# Patient Record
Sex: Male | Born: 1960 | Race: White | Hispanic: No | Marital: Married | Smoking: Current some day smoker
Health system: Southern US, Community
[De-identification: ages and names within clinical notes are randomized; demographics above are authoritative.]

## PROBLEM LIST (undated history)

## (undated) DIAGNOSIS — I447 Left bundle-branch block, unspecified: Secondary | ICD-10-CM

## (undated) DIAGNOSIS — Z86010 Personal history of colonic polyps: Secondary | ICD-10-CM

## (undated) DIAGNOSIS — Z8739 Personal history of other diseases of the musculoskeletal system and connective tissue: Secondary | ICD-10-CM

## (undated) DIAGNOSIS — J302 Other seasonal allergic rhinitis: Secondary | ICD-10-CM

## (undated) DIAGNOSIS — R3129 Other microscopic hematuria: Secondary | ICD-10-CM

## (undated) DIAGNOSIS — I10 Essential (primary) hypertension: Secondary | ICD-10-CM

## (undated) DIAGNOSIS — I493 Ventricular premature depolarization: Secondary | ICD-10-CM

## (undated) DIAGNOSIS — Z860101 Personal history of adenomatous and serrated colon polyps: Secondary | ICD-10-CM

## (undated) DIAGNOSIS — I7300 Raynaud's syndrome without gangrene: Secondary | ICD-10-CM

## (undated) DIAGNOSIS — Z973 Presence of spectacles and contact lenses: Secondary | ICD-10-CM

## (undated) DIAGNOSIS — E785 Hyperlipidemia, unspecified: Secondary | ICD-10-CM

## (undated) DIAGNOSIS — I429 Cardiomyopathy, unspecified: Secondary | ICD-10-CM

## (undated) DIAGNOSIS — I4729 Other ventricular tachycardia: Secondary | ICD-10-CM

## (undated) DIAGNOSIS — E291 Testicular hypofunction: Secondary | ICD-10-CM

## (undated) DIAGNOSIS — R7303 Prediabetes: Secondary | ICD-10-CM

## (undated) DIAGNOSIS — G709 Myoneural disorder, unspecified: Secondary | ICD-10-CM

## (undated) DIAGNOSIS — M1990 Unspecified osteoarthritis, unspecified site: Secondary | ICD-10-CM

## (undated) DIAGNOSIS — J439 Emphysema, unspecified: Secondary | ICD-10-CM

## (undated) DIAGNOSIS — N200 Calculus of kidney: Secondary | ICD-10-CM

## (undated) DIAGNOSIS — M10371 Gout due to renal impairment, right ankle and foot: Secondary | ICD-10-CM

## (undated) DIAGNOSIS — N529 Male erectile dysfunction, unspecified: Secondary | ICD-10-CM

## (undated) DIAGNOSIS — M1A09X0 Idiopathic chronic gout, multiple sites, without tophus (tophi): Secondary | ICD-10-CM

## (undated) HISTORY — DX: Gout due to renal impairment, right ankle and foot: M10.371

## (undated) HISTORY — DX: Other seasonal allergic rhinitis: J30.2

## (undated) HISTORY — DX: Myoneural disorder, unspecified: G70.9

## (undated) HISTORY — DX: Essential (primary) hypertension: I10

## (undated) HISTORY — PX: WISDOM TOOTH EXTRACTION: SHX21

## (undated) HISTORY — DX: Hyperlipidemia, unspecified: E78.5

---

## 2001-07-06 HISTORY — PX: BICEPS TENDON REPAIR: SHX566

## 2007-07-05 ENCOUNTER — Ambulatory Visit: Payer: Self-pay | Admitting: Family Medicine

## 2007-07-12 ENCOUNTER — Ambulatory Visit: Payer: Self-pay | Admitting: Family Medicine

## 2007-09-09 ENCOUNTER — Ambulatory Visit: Payer: Self-pay | Admitting: Family Medicine

## 2010-05-26 ENCOUNTER — Encounter: Payer: Self-pay | Admitting: Internal Medicine

## 2010-05-26 ENCOUNTER — Ambulatory Visit: Payer: Self-pay | Admitting: Internal Medicine

## 2010-05-26 DIAGNOSIS — E785 Hyperlipidemia, unspecified: Secondary | ICD-10-CM | POA: Insufficient documentation

## 2010-05-26 DIAGNOSIS — M109 Gout, unspecified: Secondary | ICD-10-CM | POA: Insufficient documentation

## 2010-05-26 DIAGNOSIS — I10 Essential (primary) hypertension: Secondary | ICD-10-CM | POA: Insufficient documentation

## 2010-05-27 ENCOUNTER — Encounter: Payer: Self-pay | Admitting: Internal Medicine

## 2010-05-27 LAB — CONVERTED CEMR LAB
AST: 65 units/L — ABNORMAL HIGH (ref 0–37)
Albumin: 4 g/dL (ref 3.5–5.2)
Basophils Absolute: 0.1 10*3/uL (ref 0.0–0.1)
Basophils Relative: 0.8 % (ref 0.0–3.0)
CO2: 28 meq/L (ref 19–32)
Eosinophils Absolute: 0.5 10*3/uL (ref 0.0–0.7)
Glucose, Bld: 123 mg/dL — ABNORMAL HIGH (ref 70–99)
HCT: 46.5 % (ref 39.0–52.0)
Hemoglobin: 16 g/dL (ref 13.0–17.0)
Lymphs Abs: 1.8 10*3/uL (ref 0.7–4.0)
MCHC: 34.3 g/dL (ref 30.0–36.0)
Monocytes Relative: 6.9 % (ref 3.0–12.0)
Neutro Abs: 6.1 10*3/uL (ref 1.4–7.7)
Potassium: 4.3 meq/L (ref 3.5–5.1)
RDW: 13.3 % (ref 11.5–14.6)
Sodium: 139 meq/L (ref 135–145)
TSH: 1.45 microintl units/mL (ref 0.35–5.50)
Total Protein: 6.5 g/dL (ref 6.0–8.3)

## 2010-08-02 ENCOUNTER — Encounter: Payer: Self-pay | Admitting: Internal Medicine

## 2010-08-05 NOTE — Assessment & Plan Note (Signed)
Summary: new pt/pt will come in fasting requesting cpx/njr ok per doc/njr   Vital Signs:  Patient profile:   50 year old male Height:      70 inches Weight:      193 pounds BMI:     27.79 Temp:     97.6 degrees F oral Pulse rate:   80 / minute Pulse rhythm:   regular BP sitting:   164 / 110  (left arm) Cuff size:   large  Vitals Entered By: Alfred Levins, CMA (May 26, 2010 9:01 AM)  Serial Vital Signs/Assessments:  Time      Position  BP       Pulse  Resp  Temp     By                     160/100                        Birdie Sons MD  CC: NPX, fasting   CC:  NPX and fasting.  History of Present Illness: CPX pt states hx of hyperlipidemia---  Preventive Screening-Counseling & Management  Alcohol-Tobacco     Smoking Status: current  Caffeine-Diet-Exercise     Does Patient Exercise: yes      Drug Use:  no.    Current Problems (verified): 1)  Hyperlipidemia  (ICD-272.4) 2)  Hypertension  (ICD-401.9)  Current Medications (verified): 1)  Allopurinol 100 Mg Tabs (Allopurinol) .Marland Kitchen.. 1 By Mouth Daily  Allergies (verified): No Known Drug Allergies  Past History:  Family History: Last updated: 05/26/2010 Family History of CAD Male 1st degree relative <50 Family History Diabetes 1st degree relative Family History Hypertension  Social History: Last updated: 05/26/2010 Married Current Smoker 1 pack every 3 days Alcohol use-yes 1 drink per day Drug use-no Regular exercise-yes 3 hrs weekly works for large [employer])  Risk Factors: Exercise: yes (05/26/2010)  Risk Factors: Smoking Status: current (05/26/2010)  Past Medical History: Hypertension Hyperlipidemia Gout  Past Surgical History: Repair ruture bicep 2003  Family History: Family History of CAD Male 1st degree relative <50 Family History Diabetes 1st degree relative Family History Hypertension  Social History: Married Current Smoker 1 pack every 3 days Alcohol  use-yes 1 drink per day Drug use-no Regular exercise-yes 3 hrs weekly 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 HEENT exam atraumatic, normocephalic symmetric her muscles are intact. Neck is supple without lymphadenopathy or thyromegaly. Chest clear to auscultation cardiac exam S1-S2 are regular. Abdominal exam moderately overweight, to bowel sounds and soft, nontender. Extremities there is no clubbing cyanosis or edema. Neurologic exam is alert and oriented without any motor or sensory deficits. It's worth noting that anterior deltoid area on the right is absent.   Impression & Recommendations:  Problem # 1:  PREVENTIVE HEALTH CARE (ICD-V70.0)  helath maint UTD tetanus immunization given today. I will on discussion with patient regarding lifestyle. No his blood pressure is elevated. I suspect his lipids will also be elevated. He needs a surf on a low calorie diet. He needs to start exercising daily. He needs to consider weight loss. I'll followup with him based on what his laboratory work shows today. We'll contact him after lab work is done.  Orders: UA Dipstick w/o Micro (automated)  (81003) Venipuncture (14782) TLB-Lipid  Panel (80061-LIPID) TLB-BMP (Basic Metabolic Panel-BMET) (80048-METABOL) TLB-CBC Platelet - w/Differential (85025-CBCD) TLB-Hepatic/Liver Function Pnl (80076-HEPATIC) TLB-TSH (Thyroid Stimulating Hormone) (84443-TSH)  Problem # 2:  HYPERLIPIDEMIA (ICD-272.4)  Problem # 3:  HYPERTENSION (ICD-401.9)  Complete Medication List: 1)  Allopurinol 100 Mg Tabs (Allopurinol) .Marland Kitchen.. 1 by mouth daily    Orders Added: 1)  New Patient 40-64 years [99386] 2)  UA Dipstick w/o Micro (automated)  [81003] 3)  Venipuncture [36415] 4)  TLB-Lipid Panel [80061-LIPID] 5)  TLB-BMP (Basic Metabolic Panel-BMET)  [80048-METABOL] 6)  TLB-CBC Platelet - w/Differential [85025-CBCD] 7)  TLB-Hepatic/Liver Function Pnl [80076-HEPATIC] 8)  TLB-TSH (Thyroid Stimulating Hormone) [40347-QQV]    Prevention & Chronic Care Immunizations   Influenza vaccine: Not documented    Tetanus booster: Not documented    Pneumococcal vaccine: Not documented  Other Screening   Smoking status: current  (05/26/2010)  Lipids   Total Cholesterol: Not documented   LDL: Not documented   LDL Direct: Not documented   HDL: Not documented   Triglycerides: Not documented    SGOT (AST): Not documented   SGPT (ALT): Not documented   Alkaline phosphatase: Not documented   Total bilirubin: Not documented  Hypertension   Last Blood Pressure: 164 / 110  (05/26/2010)   Serum creatinine: Not documented   Serum potassium Not documented  Self-Management Support :    Hypertension self-management support: Not documented    Lipid self-management support: Not documented   Physical Exam General Appearance: well developed, well nourished, no acute distress Eyes: conjunctiva and lids normal, PERRL, EOMI,  Ears, Nose, Mouth, Throat: TM clear, nares clear, oral exam WNL Neck: supple, no lymphadenopathy, no thyromegaly, no JVD Respiratory: clear to auscultation and percussion, respiratory effort normal Cardiovascular: regular rate and rhythm, S1-S2, no murmur, rub or gallop, no bruits, peripheral pulses normal and symmetric, no cyanosis, clubbing, edema or varicosities Chest: no scars, masses, tenderness; no asymmetry, skin changes, nipple discharge, no gynecomastia   Gastrointestinal: soft, non-tender; no hepatosplenomegaly, masses; active bowel sounds all quadrants, ; no masses, tenderness, hemorrhoids  Genitourinary: no hernia,  or prostate enlargement Lymphatic: no cervical, axillary or inguinal adenopathy Musculoskeletal: gait normal, muscle tone and strength WNL, no joint swelling, effusions, discoloration, crepitus    Skin: clear, good turgor, color WNL, no rashes, lesions, or ulcerations Neurologic: normal mental status, normal reflexes, normal strength, sensation, and motion Psychiatric: alert; oriented to person, place and time Other Exam:   Appended Document: Orders Update    Clinical Lists Changes  Orders: Added new Service order of Specimen Handling (95638) - Signed Added new Service order of Admin 1st Vaccine (75643) - Signed Added new Service order of Flu Vaccine 52yrs + (32951) - Signed Added new Service order of Tdap => 71yrs IM (88416) - Signed Added new Service order of Admin of Any Addtl Vaccine (60630) - Signed Observations: Added new observation of COMMENTS: Wynona Canes, CMA  May 26, 2010 1:34 PM  (05/26/2010 10:19) Added new observation of PH URINE: 5.0  (05/26/2010 10:19) Added new observation of SPEC GR URIN: 1.025  (05/26/2010 10:19) Added new observation of APPEARANCE U: Clear  (05/26/2010 10:19) Added new observation of UA COLOR: yellow  (05/26/2010 10:19) Added new observation of WBC DIPSTK U: negative  (05/26/2010 10:19) Added new observation of NITRITE URN: negative  (05/26/2010 10:19) Added new observation of UROBILINOGEN: 0.2  (05/26/2010 10:19) Added new observation of PROTEIN, URN: negative  (05/26/2010 10:19) Added new observation of BLOOD UR DIP: trace-lysed  (05/26/2010 10:19) Added new observation of  KETONES URN: negative  (05/26/2010 10:19) Added new observation of BILIRUBIN UR: negative  (05/26/2010 10:19) Added new observation of GLUCOSE, URN: negative  (05/26/2010 10:19) Added new observation of TD BOOSTERLO: [id number]  (05/26/2010 10:19) Added new observation of TD BOOST EXP: 04/24/2012  (05/26/2010 10:19) Added new observation of TD BOOSTERBY: Alfred Levins, CMA  (05/26/2010 10:19) Added new observation of TD BOOSTERRT: IM  (05/26/2010 10:19) Added new observation of TDBOOSTERDSE: 0.5 ml  (05/26/2010 10:19) Added new observation of TD BOOSTERMF:  Glax[employer]Smith[employer]line  (05/26/2010 10:19) Added new observation of TD BOOST SIT: left deltoid  (05/26/2010 10:19) Added new observation of TD BOOSTER: Tdap  (05/26/2010 10:19) Added new observation of FLU VAX VIS: 01/28/10 version  (05/26/2010 10:19) Added new observation of FLU VAXLOT: [id number]  (05/26/2010 10:19) Added new observation of FLU VAXMFR: Glaxosmithkline  (05/26/2010 10:19) Added new observation of FLU VAX EXP: 01/03/2011  (05/26/2010 10:19) Added new observation of FLU VAX DSE: 0.60ml  (05/26/2010 10:19) Added new observation of FLU VAX: Fluvax 3+  (05/26/2010 10:19)Flu Vaccine Consent Questions     Do you have a history of severe allergic reactions to this vaccine? no    Any prior history of allergic reactions to egg and/or gelatin? no    Do you have a sensitivity to the preservative Thimersol? no    Do you have a past history of Guillan-Barre Syndrome? no    Do you currently have an acute febrile illness? no    Have you ever had a severe reaction to latex? no    Vaccine information given and explained to patient? yes    Are you currently pregnant? no    Lot Number:[id number]   Exp Date:01/03/2011   Site Given  right Deltoid IMtion of FLU VAX EXP: 01/03/2011 (05/26/2010 10:19) Added new observation of FLU VAX DSE: 0.44ml (05/26/2010 10:19) Added new observation of FLU VAX: Fluvax 3+ (05/26/2010 10:19)     .[email or web address]    Immunizations Administered:  Tetanus Vaccine:    Vaccine Type: Tdap    Site: left deltoid    Mfr: GlaxoSmithKline    Dose: 0.5 ml    Route: IM    Given by: Alfred Levins, CMA    Exp. Date: 04/24/2012    Lot #: [id number]   Laboratory Results   Urine Tests  Date/Time Recieved: May 26, 2010 1:34 PM  Date/Time Reported: May 26, 2010 1:34 PM   Routine Urinalysis   Color: yellow Appearance: Clear Glucose: negative   (Normal Range: Negative) Bilirubin: negative   (Normal Range: Negative) Ketone: negative   (Normal Range: Negative) Spec.  Gravity: 1.025   (Normal Range: 1.003-1.035) Blood: trace-lysed   (Normal Range: Negative) pH: 5.0   (Normal Range: 5.0-8.0) Protein: negative   (Normal Range: Negative) Urobilinogen: 0.2   (Normal Range: 0-1) Nitrite: negative   (Normal Range: Negative) Leukocyte Esterace: negative   (Normal Range: Negative)    Comments: Wynona Canes, CMA  May 26, 2010 1:34 PM

## 2010-08-05 NOTE — Letter (Signed)
Summary: Metabolic Syndrome Test Result/[employer]  Metabolic Syndrome Test Result/[employer]   Imported By: Maryln Gottron 06/05/2010 10:52:15  _____________________________________________________________________  External Attachment:    Type:   Image     Comment:   External Document

## 2010-08-26 ENCOUNTER — Other Ambulatory Visit (INDEPENDENT_AMBULATORY_CARE_PROVIDER_SITE_OTHER): Payer: Medicare HMO | Admitting: Internal Medicine

## 2010-08-26 DIAGNOSIS — T887XXA Unspecified adverse effect of drug or medicament, initial encounter: Secondary | ICD-10-CM

## 2010-08-26 DIAGNOSIS — E785 Hyperlipidemia, unspecified: Secondary | ICD-10-CM

## 2010-08-26 LAB — LIPID PANEL
Cholesterol: 245 mg/dL — ABNORMAL HIGH (ref 0–200)
HDL: 44.2 mg/dL (ref >39.00)
Triglycerides: 816 mg/dL — ABNORMAL HIGH (ref 0.0–149.0)

## 2010-08-26 LAB — HEPATIC FUNCTION PANEL
Bilirubin, Direct: 0.2 mg/dL (ref 0.0–0.3)
Total Protein: 6.4 g/dL (ref 6.0–8.3)

## 2010-08-27 LAB — LDL CHOLESTEROL, DIRECT: Direct LDL: 100.1 mg/dL

## 2010-09-03 ENCOUNTER — Ambulatory Visit (INDEPENDENT_AMBULATORY_CARE_PROVIDER_SITE_OTHER): Payer: Medicare HMO | Admitting: Internal Medicine

## 2010-09-03 ENCOUNTER — Encounter: Payer: Self-pay | Admitting: Internal Medicine

## 2010-09-03 DIAGNOSIS — M109 Gout, unspecified: Secondary | ICD-10-CM

## 2010-09-03 DIAGNOSIS — E785 Hyperlipidemia, unspecified: Secondary | ICD-10-CM

## 2010-09-03 DIAGNOSIS — I10 Essential (primary) hypertension: Secondary | ICD-10-CM

## 2010-09-03 MED ORDER — FLUTICASONE PROPIONATE 50 MCG/ACT NA SUSP: NASAL | Status: DC

## 2010-09-03 MED ORDER — LISINOPRIL 20 MG PO TABS: 20.0000 mg | ORAL_TABLET | Freq: Every day | ORAL | Status: DC

## 2010-09-03 NOTE — Progress Notes (Signed)
  Subjective:    Patient here for follow-up of elevated blood pressure.  He is exercising and is adherent to a low-salt diet.  Blood pressure is not well controlled at home. Cardiac symptoms: none. Patient denies: chest pain, chest pressure/discomfort, dyspnea and exertional chest pressure/discomfort. Cardiovascular risk factors: dyslipidemia and hypertension. Use of agents associated with hypertension: none. History of target organ damage: none.  The following portions of the patient's history were reviewed and updated as appropriate: allergies, current medications, past family history, past medical history, past social history, past surgical history and problem list.  Review of Systems  patient denies chest pain, shortness of breath, orthopnea. Denies lower extremity edema, abdominal pain, change in appetite, change in bowel movements. Patient denies rashes, musculoskeletal complaints. No other specific complaints in a complete review of systems.     Objective:    well-developed well-nourished male in no acute distress. HEENT exam atraumatic, normocephalic, neck supple without jugular venous distention. Chest clear to auscultation cardiac exam S1-S2 are regular. Abdominal exam overweight with bowel sounds, soft and nontender. Extremities no edema. Neurologic exam is alert with a normal gait.   Assessment:    Hypertension, stage 2 needs treatment. Evidence of target organ damage: none.    Plan:    Medication: start lisinopril.  sidde effects discussed  Note lipids---refuses Rx for now---reconsider in 6 weeks

## 2010-10-13 ENCOUNTER — Ambulatory Visit (INDEPENDENT_AMBULATORY_CARE_PROVIDER_SITE_OTHER): Payer: Medicare HMO | Admitting: Internal Medicine

## 2010-10-13 ENCOUNTER — Encounter: Payer: Self-pay | Admitting: Internal Medicine

## 2010-10-13 VITALS — BP 122/84 | HR 80 | Temp 98.2°F | Ht 69.0 in | Wt 188.0 lb

## 2010-10-13 DIAGNOSIS — J302 Other seasonal allergic rhinitis: Secondary | ICD-10-CM

## 2010-10-13 DIAGNOSIS — I10 Essential (primary) hypertension: Secondary | ICD-10-CM

## 2010-10-13 DIAGNOSIS — E7889 Other lipoprotein metabolism disorders: Secondary | ICD-10-CM

## 2010-10-13 DIAGNOSIS — E785 Hyperlipidemia, unspecified: Secondary | ICD-10-CM

## 2010-10-13 MED ORDER — FLUTICASONE PROPIONATE 50 MCG/ACT NA SUSP: NASAL | Status: DC

## 2010-10-13 MED ORDER — LISINOPRIL 20 MG PO TABS: 20.0000 mg | ORAL_TABLET | Freq: Every day | ORAL | Status: DC

## 2010-10-13 NOTE — Assessment & Plan Note (Signed)
He has lost approx 10 pounds Will recheck labs in 6 weeks and call him with results

## 2010-10-13 NOTE — Assessment & Plan Note (Signed)
Controlled Continue current meds 

## 2010-10-13 NOTE — Progress Notes (Signed)
  Subjective:    Patient ID: [name] [name], male    DOB: [birth year], 50 y.o.   MRN: [id number]  HPI htn---home bps 120-s/70s No side effects on meds  Lipids: note 10 pound weight loss---he has been exercising more.   Past Medical History  Diagnosis Date  . Hypertension   . Hyperlipidemia   . Gout    Past Surgical History  Procedure Date  . Biceps tendon repair      reports that he has been smoking Cigarettes.  He does not have any smokeless tobacco history on file. He reports that he drinks alcohol. He reports that he does not use illicit drugs. family history includes Coronary artery disease in an unspecified family member; Diabetes in an unspecified family member; and Hypertension in an unspecified family member. No Known Allergies   Review of Systems  patient denies chest pain, shortness of breath, orthopnea. Denies lower extremity edema, abdominal pain, change in appetite, change in bowel movements. Patient denies rashes, musculoskeletal complaints. No other specific complaints in a complete review of systems.      Objective:   Physical Exam  well-developed well-nourished male in no acute distress. HEENT exam atraumatic, normocephalic, neck supple without jugular venous distention. Chest clear to auscultation cardiac exam S1-S2 are regular. Abdominal exam overweight with bowel sounds, soft and nontender. Extremities no edema. Neurologic exam is alert with a normal gait.        Assessment & Plan:

## 2010-11-24 ENCOUNTER — Other Ambulatory Visit (INDEPENDENT_AMBULATORY_CARE_PROVIDER_SITE_OTHER): Payer: Medicare HMO | Admitting: Internal Medicine

## 2010-11-24 DIAGNOSIS — E7889 Other lipoprotein metabolism disorders: Secondary | ICD-10-CM

## 2010-11-24 DIAGNOSIS — E789 Disorder of lipoprotein metabolism, unspecified: Secondary | ICD-10-CM

## 2010-11-24 DIAGNOSIS — E785 Hyperlipidemia, unspecified: Secondary | ICD-10-CM

## 2010-11-24 DIAGNOSIS — N529 Male erectile dysfunction, unspecified: Secondary | ICD-10-CM

## 2010-11-24 LAB — LIPID PANEL
Cholesterol: 243 mg/dL — ABNORMAL HIGH (ref 0–200)
Total CHOL/HDL Ratio: 5
Triglycerides: 598 mg/dL — ABNORMAL HIGH (ref 0.0–149.0)

## 2010-11-26 ENCOUNTER — Other Ambulatory Visit: Payer: Medicare HMO

## 2010-11-26 DIAGNOSIS — E785 Hyperlipidemia, unspecified: Secondary | ICD-10-CM

## 2010-11-26 LAB — FOLLICLE STIMULATING HORMONE: FSH: 3.3 m[IU]/mL (ref 1.4–18.1)

## 2012-05-30 ENCOUNTER — Ambulatory Visit (INDEPENDENT_AMBULATORY_CARE_PROVIDER_SITE_OTHER): Payer: Medicare HMO | Admitting: Internal Medicine

## 2012-05-30 ENCOUNTER — Encounter: Payer: Self-pay | Admitting: Internal Medicine

## 2012-05-30 VITALS — BP 156/102 | Temp 98.2°F | Ht 70.0 in | Wt 194.0 lb

## 2012-05-30 DIAGNOSIS — J302 Other seasonal allergic rhinitis: Secondary | ICD-10-CM

## 2012-05-30 DIAGNOSIS — E785 Hyperlipidemia, unspecified: Secondary | ICD-10-CM

## 2012-05-30 DIAGNOSIS — I10 Essential (primary) hypertension: Secondary | ICD-10-CM

## 2012-05-30 DIAGNOSIS — J309 Allergic rhinitis, unspecified: Secondary | ICD-10-CM

## 2012-05-30 LAB — BASIC METABOLIC PANEL
CO2: 28 mEq/L (ref 19–32)
Calcium: 9.6 mg/dL (ref 8.4–10.5)
Creatinine, Ser: 1.1 mg/dL (ref 0.4–1.5)
GFR: 76.16 mL/min (ref >60.00)
Sodium: 137 mEq/L (ref 135–145)

## 2012-05-30 LAB — HEPATIC FUNCTION PANEL
ALT: 40 U/L (ref 0–53)
AST: 46 U/L — ABNORMAL HIGH (ref 0–37)
Albumin: 3.8 g/dL (ref 3.5–5.2)
Alkaline Phosphatase: 64 U/L (ref 39–117)
Total Bilirubin: 0.7 mg/dL (ref 0.3–1.2)

## 2012-05-30 LAB — LIPID PANEL
Cholesterol: 234 mg/dL — ABNORMAL HIGH (ref 0–200)
Total CHOL/HDL Ratio: 6
Triglycerides: 645 mg/dL — ABNORMAL HIGH (ref 0.0–149.0)

## 2012-05-30 LAB — LDL CHOLESTEROL, DIRECT: Direct LDL: 90.1 mg/dL

## 2012-05-30 MED ORDER — FLUTICASONE PROPIONATE 50 MCG/ACT NA SUSP: NASAL | Status: DC

## 2012-05-30 MED ORDER — AMLODIPINE BESYLATE 5 MG PO TABS: 5.0000 mg | ORAL_TABLET | Freq: Every day | ORAL | Status: DC

## 2012-05-30 NOTE — Progress Notes (Signed)
Patient ID: [name] [name], male   DOB: Jun 24, 1961, 51 y.o.   MRN: [id number] htn-- tried lisinopril-- caused cough Has not taken meds in 2 months BPs on lisinopril- 135/72 (average)  Allergic sxs-- out of flonase   Past Medical History  Diagnosis Date  . Hypertension   . Hyperlipidemia   . Gout     History   Social History  . Marital Status: Married    Spouse Name: N/A    Number of Children: N/A  . Years of Education: N/A   Occupational History  . Not on file.   Social History Main Topics  . Smoking status: Current Some Day Smoker    Types: Cigarettes  . Smokeless tobacco: Not on file     Comment: 1 pack every 3 days  . Alcohol Use: Yes     Comment: 1 daily  . Drug Use: No  . Sexually Active: Not on file   Other Topics Concern  . Not on file   Social History Narrative  . No narrative on file    Past Surgical History  Procedure Date  . Biceps tendon repair     Family History  Problem Relation Age of Onset  . Coronary artery disease    . Diabetes    . Hypertension      Allergies  Allergen Reactions  . Lisinopril Cough    cough    Current Outpatient Prescriptions on File Prior to Visit  Medication Sig Dispense Refill  . fluticasone (FLONASE) 50 MCG/ACT nasal spray 2 sprays each nostril daily  16 g  3  . amLODipine (NORVASC) 5 MG tablet Take 1 tablet (5 mg total) by mouth daily.  90 tablet  3     patient denies chest pain, shortness of breath, orthopnea. Denies lower extremity edema, abdominal pain, change in appetite, change in bowel movements. Patient denies rashes, musculoskeletal complaints. No other specific complaints in a complete review of systems.   BP 156/102  Temp 98.2 F (36.8 C) (Oral)  Ht 5\' 10"  (1.778 m)  Wt 194 lb (87.998 kg)  BMI 27.84 kg/m2  well-developed well-nourished male in no acute distress. HEENT exam atraumatic, normocephalic, neck supple without jugular venous distention. Chest clear to auscultation cardiac exam  S1-S2 are regular. Abdominal exam overweight with bowel sounds, soft and nontender. Extremities no edema. Neurologic exam is alert with a normal gait.

## 2012-06-01 ENCOUNTER — Other Ambulatory Visit: Payer: Self-pay | Admitting: *Deleted

## 2012-06-01 MED ORDER — ATORVASTATIN CALCIUM 20 MG PO TABS: 20.0000 mg | ORAL_TABLET | Freq: Every day | ORAL | Status: DC

## 2012-06-01 NOTE — Assessment & Plan Note (Signed)
i suspect he will need treatments Check labs today

## 2012-06-01 NOTE — Assessment & Plan Note (Signed)
Side effect to lisinopril Start amlodipine Side effects discussed

## 2012-09-16 ENCOUNTER — Ambulatory Visit (INDEPENDENT_AMBULATORY_CARE_PROVIDER_SITE_OTHER): Payer: Medicare HMO | Admitting: Family

## 2012-09-16 ENCOUNTER — Encounter: Payer: Self-pay | Admitting: Family

## 2012-09-16 VITALS — BP 138/90 | HR 83 | Wt 197.0 lb

## 2012-09-16 DIAGNOSIS — I10 Essential (primary) hypertension: Secondary | ICD-10-CM

## 2012-09-16 DIAGNOSIS — M25511 Pain in right shoulder: Secondary | ICD-10-CM

## 2012-09-16 DIAGNOSIS — E785 Hyperlipidemia, unspecified: Secondary | ICD-10-CM

## 2012-09-16 DIAGNOSIS — M25519 Pain in unspecified shoulder: Secondary | ICD-10-CM

## 2012-09-16 LAB — BASIC METABOLIC PANEL
BUN: 15 mg/dL (ref 6–23)
CO2: 26 mEq/L (ref 19–32)
Chloride: 103 mEq/L (ref 96–112)
Creatinine, Ser: 1.2 mg/dL (ref 0.4–1.5)
Glucose, Bld: 101 mg/dL — ABNORMAL HIGH (ref 70–99)
Potassium: 3.5 mEq/L (ref 3.5–5.1)

## 2012-09-16 LAB — HEPATIC FUNCTION PANEL
AST: 40 U/L — ABNORMAL HIGH (ref 0–37)
Albumin: 4.1 g/dL (ref 3.5–5.2)
Alkaline Phosphatase: 79 U/L (ref 39–117)
Total Protein: 7.1 g/dL (ref 6.0–8.3)

## 2012-09-16 LAB — LDL CHOLESTEROL, DIRECT: Direct LDL: 64.8 mg/dL

## 2012-09-16 LAB — LIPID PANEL: Cholesterol: 141 mg/dL (ref 0–200)

## 2012-09-16 MED ORDER — DICLOFENAC SODIUM 75 MG PO TBEC: 75.0000 mg | DELAYED_RELEASE_TABLET | Freq: Two times a day (BID) | ORAL | Status: DC

## 2012-09-16 NOTE — Patient Instructions (Addendum)
Rotator Cuff Tear The rotator cuff is four tendons that assist in the motion of the shoulder. A rotator cuff tear is a tear in one of these four tendons. It is characterized by pain and weakness of the shoulder. The rotator cuff tendons surround the shoulder ball and socket joint (humeral head). The rotator cuff tendons attach to the shoulder blade (scapula) on one side and the upper arm bone (humerus) on the other side. The rotator cuff is essential for shoulder stability and shoulder motion. SYMPTOMS   Pain around the shoulder, often at the outer portion of the upper arm.  Pain that is worse with shoulder function, especially when reaching overhead or lifting.  Weakness of the shoulder muscles.  Aching when not using your arm; often, pain awakens you at night, especially when sleeping on the affected side.  Tenderness, swelling, warmth, or redness over the outer aspect of the shoulder.  Loss of strength.  Limited motion of the shoulder, especially reaching behind (reaching into one's back pocket) or across your body.  A crackling sound (crepitation) when moving the shoulder.  Biceps tendon pain (in the front of the shoulder) and inflammation, worse with bending the elbow or lifting. CAUSES   Strain from sudden increase in amount or intensity of activity.  Direct blow or injury to the shoulder.  Aging, wear from from normal use.  Roof of the shoulder (acromial) spur. RISK INCREASES WITH:   Contact sports (football, wrestling, or boxing).  Throwing or hitting sports (baseball, tennis, or volleyball).  Weightlifting and bodybuilding.  Heavy labor.  Previous injury to rotator cuff.  Failure to warm up properly before activity.  Inadequate protective equipment.  Increasing age.  Spurring of the outer end of the scapula (acromion).  Cortisone injections.  Poor shoulder strength and flexibility. PREVENTION  Warm up and stretch properly before activity.  Allow time  for rest and recovery between practices and competition.  Maintain physical fitness:  Cardiovascular fitness.  Shoulder flexibility.  Strength and endurance of the rotator cuff muscles and muscles of the shoulder blade.  Learn and use proper technique when throwing or hitting. PROGNOSIS Surgery is often needed. Although, symptoms may go away by themselves. RELATED COMPLICATIONS   Persistent pain that may progress to constant pain.  Shoulder stiffness, frozen shoulder syndrome, or loss of motion.  Recurrence of symptoms, especially if treated without surgery.  Inability to return to same level of sports, even with surgery.  Persistent weakness.  Risks of surgery, including infection, bleeding, injury to nerves, shoulder stiffness, weakness, re-tearing of the rotator cuff tendon.  Deltoid detachment, acromial fracture, and persistent pain. TREATMENT Treatment involves the use of ice and medicine to reduce pain and inflammation. Strengthening and stretching exercise are usually recommended. These exercises may be completed at home or with a therapist. You may also be instructed to modify offending activities. Corticosteroid injections may be given to reduce inflammation. Surgery is usually recommended for athletes. Surgery has the best chance for a full recovery. Surgery involves:  Removal of an inflamed bursa.  Removal of an acromial spur if present.  Suturing the torn tendon back together. Rotator cuff surgeries may be preformed either arthroscopically or through an open incision. Recovery typically takes 6 to 12 months. MEDICATION  If pain medicine is necessary, then nonsteroidal anti-inflammatory medicines, such as aspirin and ibuprofen, or other minor pain relievers, such as acetaminophen, are often recommended.  Do not take pain medicine for 7 days before surgery.  Prescription pain relievers are usually  only prescribed after surgery. Use only as directed and only as  much as you need.  Corticosteroid injections may be given to reduce inflammation. However, there is a limited number of times the joint may be injected with these medicines. HEAT AND COLD  Cold treatment (icing) relieves pain and reduces inflammation. Cold treatment should be applied for 10 to 15 minutes every 2 to 3 hours for inflammation and pain and immediately after any activity that aggravates your symptoms. Use ice packs or massage the area with a piece of ice (ice massage).  Heat treatment may be used prior to performing the stretching and strengthening activities prescribed by your caregiver, physical therapist, or athletic trainer. Use a heat pack or soak the injury in warm water. SEEK MEDICAL CARE IF:   Symptoms get worse or do not improve in 4 to 6 weeks despite treatment.  You experience pain, numbness, or coldness in the hand.  Blue, gray, or dark color appears in the fingernails.  New, unexplained symptoms develop (drugs used in treatment may produce side effects). Document Released: 06/22/2005 Document Revised: 09/14/2011 Document Reviewed: 10/04/2008 [facility] [place] Patient Information 2013 [place], [place].  Shoulder Exercises EXERCISES  RANGE OF MOTION (ROM) AND STRETCHING EXERCISES These exercises may help you when beginning to rehabilitate your injury. Your symptoms may resolve with or without further involvement from your physician, physical therapist or athletic trainer. While completing these exercises, remember:   Restoring tissue flexibility helps normal motion to return to the joints. This allows healthier, less painful movement and activity.  An effective stretch should be held for at least 30 seconds.  A stretch should never be painful. You should only feel a gentle lengthening or release in the stretched tissue. ROM - Pendulum  Bend at the waist so that your right / left arm falls away from your body. Support yourself with your opposite hand on a solid surface,  such as a table or a countertop.  Your right / left arm should be perpendicular to the ground. If it is not perpendicular, you need to lean over farther. Relax the muscles in your right / left arm and shoulder as much as possible.  Gently sway your hips and trunk so they move your right / left arm without any use of your right / left shoulder muscles.  Progress your movements so that your right / left arm moves side to side, then forward and backward, and finally, both clockwise and counterclockwise.  Complete __________ repetitions in each direction. Many people use this exercise to relieve discomfort in their shoulder as well as to gain range of motion. Repeat __________ times. Complete this exercise __________ times per day. STRETCH  Flexion, Standing  Stand with good posture. With an underhand grip on your right / left hand and an overhand grip on the opposite hand, grasp a broomstick or cane so that your hands are a little more than shoulder-width apart.  Keeping your right / left elbow straight and shoulder muscles relaxed, push the stick with your opposite hand to raise your right / left arm in front of your body and then overhead. Raise your arm until you feel a stretch in your right / left shoulder, but before you have increased shoulder pain.  Try to avoid shrugging your right / left shoulder as your arm rises by keeping your shoulder blade tucked down and toward your mid-back spine. Hold __________ seconds.  Slowly return to the starting position. Repeat __________ times. Complete this exercise __________ times per  day. STRETCH - Internal Rotation  Place your right / left hand behind your back, palm-up.  Throw a towel or belt over your opposite shoulder. Grasp the towel/belt with your right / left hand.  While keeping an upright posture, gently pull up on the towel/belt until you feel a stretch in the front of your right / left shoulder.  Avoid shrugging your right / left  shoulder as your arm rises by keeping your shoulder blade tucked down and toward your mid-back spine.  Hold __________. Release the stretch by lowering your opposite hand. Repeat __________ times. Complete this exercise __________ times per day. STRETCH - External Rotation and Abduction  Stagger your stance through a doorframe. It does not matter which foot is forward.  As instructed by your physician, physical therapist or athletic trainer, place your hands:  And forearms above your head and on the door frame.  And forearms at head-height and on the door frame.  At elbow-height and on the door frame.  Keeping your head and chest upright and your stomach muscles tight to prevent over-extending your low-back, slowly shift your weight onto your front foot until you feel a stretch across your chest and/or in the front of your shoulders.  Hold __________ seconds. Shift your weight to your back foot to release the stretch. Repeat __________ times. Complete this stretch __________ times per day.  STRENGTHENING EXERCISES  These exercises may help you when beginning to rehabilitate your injury. They may resolve your symptoms with or without further involvement from your physician, physical therapist or athletic trainer. While completing these exercises, remember:   Muscles can gain both the endurance and the strength needed for everyday activities through controlled exercises.  Complete these exercises as instructed by your physician, physical therapist or athletic trainer. Progress the resistance and repetitions only as guided.  You may experience muscle soreness or fatigue, but the pain or discomfort you are trying to eliminate should never worsen during these exercises. If this pain does worsen, stop and make certain you are following the directions exactly. If the pain is still present after adjustments, discontinue the exercise until you can discuss the trouble with your clinician.  If  advised by your physician, during your recovery, avoid activity or exercises which involve actions that place your right / left hand or elbow above your head or behind your back or head. These positions stress the tissues which are trying to heal. STRENGTH - Scapular Depression and Adduction  With good posture, sit on a firm chair. Supported your arms in front of you with pillows, arm rests or a table top. Have your elbows in line with the sides of your body.  Gently draw your shoulder blades down and toward your mid-back spine. Gradually increase the tension without tensing the muscles along the top of your shoulders and the back of your neck.  Hold for __________ seconds. Slowly release the tension and relax your muscles completely before completing the next repetition.  After you have practiced this exercise, remove the arm support and complete it in standing as well as sitting. Repeat __________ times. Complete this exercise __________ times per day.  STRENGTH - External Rotators  Secure a rubber exercise band/tubing to a fixed object so that it is at the same height as your right / left elbow when you are standing or sitting on a firm surface.  Stand or sit so that the secured exercise band/tubing is at your side that is not injured.  Bend your  elbow 90 degrees. Place a folded towel or small pillow under your right / left arm so that your elbow is a few inches away from your side.  Keeping the tension on the exercise band/tubing, pull it away from your body, as if pivoting on your elbow. Be sure to keep your body steady so that the movement is only coming from your shoulder rotating.  Hold __________ seconds. Release the tension in a controlled manner as you return to the starting position. Repeat __________ times. Complete this exercise __________ times per day.  STRENGTH - Supraspinatus  Stand or sit with good posture. Grasp a __________ weight or an exercise band/tubing so that your  hand is "thumbs-up," like when you shake hands.  Slowly lift your right / left hand from your thigh into the air, traveling about 30 degrees from straight out at your side. Lift your hand to shoulder height or as far as you can without increasing any shoulder pain. Initially, many people do not lift their hands above shoulder height.  Avoid shrugging your right / left shoulder as your arm rises by keeping your shoulder blade tucked down and toward your mid-back spine.  Hold for __________ seconds. Control the descent of your hand as you slowly return to your starting position. Repeat __________ times. Complete this exercise __________ times per day.  STRENGTH - Shoulder Extensors  Secure a rubber exercise band/tubing so that it is at the height of your shoulders when you are either standing or sitting on a firm arm-less chair.  With a thumbs-up grip, grasp an end of the band/tubing in each hand. Straighten your elbows and lift your hands straight in front of you at shoulder height. Step back away from the secured end of band/tubing until it becomes tense.  Squeezing your shoulder blades together, pull your hands down to the sides of your thighs. Do not allow your hands to go behind you.  Hold for __________ seconds. Slowly ease the tension on the band/tubing as you reverse the directions and return to the starting position. Repeat __________ times. Complete this exercise __________ times per day.  STRENGTH - Scapular Retractors  Secure a rubber exercise band/tubing so that it is at the height of your shoulders when you are either standing or sitting on a firm arm-less chair.  With a palm-down grip, grasp an end of the band/tubing in each hand. Straighten your elbows and lift your hands straight in front of you at shoulder height. Step back away from the secured end of band/tubing until it becomes tense.  Squeezing your shoulder blades together, draw your elbows back as you bend them. Keep  your upper arm lifted away from your body throughout the exercise.  Hold __________ seconds. Slowly ease the tension on the band/tubing as you reverse the directions and return to the starting position. Repeat __________ times. Complete this exercise __________ times per day. STRENGTH  Scapular Depressors  Find a sturdy chair without wheels, such as a from a dining room table.  Keeping your feet on the floor, lift your bottom from the seat and lock your elbows.  Keeping your elbows straight, allow gravity to pull your body weight down. Your shoulders will rise toward your ears.  Raise your body against gravity by drawing your shoulder blades down your back, shortening the distance between your shoulders and ears. Although your feet should always maintain contact with the floor, your feet should progressively support less body weight as you get stronger.  Hold __________  seconds. In a controlled and slow manner, lower your body weight to begin the next repetition. Repeat __________ times. Complete this exercise __________ times per day.  Document Released: 05/06/2005 Document Revised: 09/14/2011 Document Reviewed: 10/04/2008 [facility] Patient Information 2013 [place], [place].

## 2012-09-16 NOTE — Progress Notes (Signed)
Subjective:    Patient ID: [name] [name], male    DOB: 1961-02-23, 52 y.o.   MRN: [id number]  HPI 52 year old white male, nonsmoker, patient of Dr. Cato Mulligan is in today for recheck of hyperlipidemia. She's currently on Lipitor 20 mg once daily and tolerating it well. Denies any concerns.  He has complaints of right shoulder pain after injuring it on Monday attempting to do some chin-ups at the gym. Rates the pain 8/10, worse with extension. Describes the pain as sharp. Took ibuprofen the first day but has not taken any more since. Has a history of the bicep repair several years ago.   Review of Systems  Constitutional: Negative.   HENT: Negative.   Eyes: Negative.   Cardiovascular: Negative.   Genitourinary: Negative.   Musculoskeletal: Positive for arthralgias.       Right shoulder pain  Skin: Negative.   Allergic/Immunologic: Negative.   Neurological: Negative.   Hematological: Negative.   Psychiatric/Behavioral: Negative.    Past Medical History  Diagnosis Date  . Hypertension   . Hyperlipidemia   . Gout     History   Social History  . Marital Status: Married    Spouse Name: N/A    Number of Children: N/A  . Years of Education: N/A   Occupational History  . Not on file.   Social History Main Topics  . Smoking status: Current Some Day Smoker    Types: Cigarettes  . Smokeless tobacco: Not on file     Comment: 1 pack every 3 days  . Alcohol Use: Yes     Comment: 1 daily  . Drug Use: No  . Sexually Active: Not on file   Other Topics Concern  . Not on file   Social History Narrative  . No narrative on file    Past Surgical History  Procedure Laterality Date  . Biceps tendon repair      Family History  Problem Relation Age of Onset  . Coronary artery disease    . Diabetes    . Hypertension      Allergies  Allergen Reactions  . Lisinopril Cough    cough    Current Outpatient Prescriptions on File Prior to Visit  Medication Sig Dispense  Refill  . amLODipine (NORVASC) 5 MG tablet Take 1 tablet (5 mg total) by mouth daily.  90 tablet  3  . atorvastatin (LIPITOR) 20 MG tablet Take 1 tablet (20 mg total) by mouth daily.  90 tablet  3  . fluticasone (FLONASE) 50 MCG/ACT nasal spray 2 sprays each nostril daily  16 g  3   No current facility-administered medications on file prior to visit.    BP 138/90  Pulse 83  Wt 197 lb (89.359 kg)  BMI 28.27 kg/m2  SpO2 97%chart    Objective:   Physical Exam  Constitutional: He is oriented to person, place, and time. He appears well-developed and well-nourished.  HENT:  Right Ear: External ear normal.  Left Ear: External ear normal.  Nose: Nose normal.  Mouth/Throat: Oropharynx is clear and moist.  Neck: Normal range of motion. Neck supple.  Cardiovascular: Normal rate, regular rhythm and normal heart sounds.   Pulmonary/Chest: Effort normal and breath sounds normal.  Abdominal: Soft. Bowel sounds are normal.  Musculoskeletal: He exhibits tenderness. He exhibits no edema.  Tenderness to palpation of the posterior right rotator cuff area. No swelling. Negative empty can test pain with forced flexion and extension.   Neurological: He is alert and  oriented to person, place, and time. He has normal reflexes.  Skin: Skin is warm and dry.  Psychiatric: He has a normal mood and affect.          Assessment & Plan:  Assessment: 1. Hyperlipidemia 2. Right shoulder pain  Plan: Diclofenac 75 mg one tablet twice a day with food. We'll consider an MRI of the right shoulder is anti-inflammatory is not effective. Lab sent to include lipids and LFTs notify patient pending results. Encouraged healthy diet, exercise. No heavy lifting or stress on the right shoulder over the next 2 weeks. Rehabilitation exercises given for home.

## 2012-10-26 ENCOUNTER — Ambulatory Visit (INDEPENDENT_AMBULATORY_CARE_PROVIDER_SITE_OTHER): Payer: Medicare HMO | Admitting: Family

## 2012-10-26 ENCOUNTER — Encounter: Payer: Self-pay | Admitting: Family

## 2012-10-26 VITALS — BP 126/72 | HR 71 | Wt 193.0 lb

## 2012-10-26 DIAGNOSIS — J309 Allergic rhinitis, unspecified: Secondary | ICD-10-CM

## 2012-10-26 DIAGNOSIS — M25519 Pain in unspecified shoulder: Secondary | ICD-10-CM

## 2012-10-26 DIAGNOSIS — S4991XS Unspecified injury of right shoulder and upper arm, sequela: Secondary | ICD-10-CM

## 2012-10-26 DIAGNOSIS — M25511 Pain in right shoulder: Secondary | ICD-10-CM

## 2012-10-26 DIAGNOSIS — IMO0001 Reserved for inherently not codable concepts without codable children: Secondary | ICD-10-CM

## 2012-10-26 DIAGNOSIS — J302 Other seasonal allergic rhinitis: Secondary | ICD-10-CM

## 2012-10-26 MED ORDER — FLUTICASONE PROPIONATE 50 MCG/ACT NA SUSP: NASAL | Status: DC

## 2012-10-26 NOTE — Patient Instructions (Signed)
Rotator Cuff Injury The rotator cuff is the collective set of muscles and tendons that make up the stabilizing unit of your shoulder. This unit holds in the ball of the humerus (upper arm bone) in the socket of the scapula (shoulder blade). Injuries to this stabilizing unit most commonly come from sports or activities that cause the arm to be moved repeatedly over the head. Examples of this include throwing, weight lifting, swimming, racquet sports, or an injury such as falling on your arm. Chronic (longstanding) irritation of this unit can cause inflammation (soreness), bursitis, and eventual damage to the tendons to the point of rupture (tear). An acute (sudden) injury of the rotator cuff can result in a partial or complete tear. You may need surgery with complete tears. Small or partial rotator cuff tears may be treated conservatively with temporary immobilization, exercises and rest. Physical therapy may be needed. HOME CARE INSTRUCTIONS   Apply ice to the injury for 15 to 20 minutes 3 to 4 times per day for the first 2 days. Put the ice in a plastic bag and place a towel between the bag of ice and your skin.  If you have a shoulder immobilizer (sling and straps), do not remove it for as long as directed by your caregiver or until you see a caregiver for a follow-up examination. If you need to remove it, move your arm as little as possible.  You may want to sleep on several pillows or in a recliner at night to lessen swelling and pain.  Only take over-the-counter or prescription medicines for pain, discomfort, or fever as directed by your caregiver.  Do simple hand squeezing exercises with a soft rubber ball to decrease hand swelling. SEEK MEDICAL CARE IF:   Pain in your shoulder increases or new pain or numbness develops in your arm, hand, or fingers.  Your hand or fingers are colder than your other hand. SEEK IMMEDIATE MEDICAL CARE IF:   Your arm, hand, or fingers are numb or  tingling.  Your arm, hand, or fingers are increasingly swollen and painful, or turn white or blue. Document Released: 06/19/2000 Document Revised: 09/14/2011 Document Reviewed: 06/12/2008 [employer] Patient Information 2013 [employer].  

## 2012-10-26 NOTE — Progress Notes (Signed)
Subjective:    Patient ID: [name] [name], male    DOB: [birth year], 52 y.o.   MRN: [id number]  HPI 52 year old white male, nonsmoker, patient of Dr. Cato Mulligan is in today with persistent right shoulder pain that's been ongoing since an injury last month. The pain is especially worse with raising his arm, abduction, and resistance. He's been taken diclofenac twice daily since the injury that is not helping. Rates the pain a 6/10, worse with certain movements, described as sharp and achy at night.   Review of Systems  Constitutional: Negative.   Respiratory: Negative.   Cardiovascular: Negative.   Musculoskeletal: Positive for arthralgias. Negative for back pain and joint swelling.       Right shoulder pain  Skin: Negative.   Allergic/Immunologic: Negative.   Neurological: Negative.   Psychiatric/Behavioral: Negative.    Past Medical History  Diagnosis Date  . Hypertension   . Hyperlipidemia   . Gout     History   Social History  . Marital Status: Married    Spouse Name: N/A    Number of Children: N/A  . Years of Education: N/A   Occupational History  . Not on file.   Social History Main Topics  . Smoking status: Current Some Day Smoker    Types: Cigarettes  . Smokeless tobacco: Not on file     Comment: 1 pack every 3 days  . Alcohol Use: Yes     Comment: 1 daily  . Drug Use: No  . Sexually Active: Not on file   Other Topics Concern  . Not on file   Social History Narrative  . No narrative on file    Past Surgical History  Procedure Laterality Date  . Biceps tendon repair      Family History  Problem Relation Age of Onset  . Coronary artery disease    . Diabetes    . Hypertension      Allergies  Allergen Reactions  . Lisinopril Cough    cough    Current Outpatient Prescriptions on File Prior to Visit  Medication Sig Dispense Refill  . amLODipine (NORVASC) 5 MG tablet Take 1 tablet (5 mg total) by mouth daily.  90 tablet  3  . atorvastatin  (LIPITOR) 20 MG tablet Take 1 tablet (20 mg total) by mouth daily.  90 tablet  3  . diclofenac (VOLTAREN) 75 MG EC tablet Take 1 tablet (75 mg total) by mouth 2 (two) times daily.  60 tablet  0   No current facility-administered medications on file prior to visit.    BP 126/72  Pulse 71  Wt 193 lb (87.544 kg)  BMI 27.69 kg/m2  SpO2 98%chart    Objective:   Physical Exam  Constitutional: He is oriented to person, place, and time. He appears well-developed and well-nourished.  Neck: Normal range of motion. Neck supple. No thyromegaly present.  Cardiovascular: Normal rate, regular rhythm and normal heart sounds.   Pulmonary/Chest: Effort normal and breath sounds normal.  Abdominal: Soft. Bowel sounds are normal.  Musculoskeletal: He exhibits no edema.  Pain elicited with forced flexion, abduction. No tenderness to palpation.  Neurological: He is alert and oriented to person, place, and time. He has normal reflexes. He exhibits normal muscle tone.  Skin: Skin is warm and dry.  Psychiatric: He has a normal mood and affect.          Assessment & Plan:  Assessment: 1. Right shoulder pain 2. Right shoulder injury  Plan: Since we've tried  conservative measures as well as anti-inflammatories, MRI of the right shoulder will notify patient pending results. Discussed possible referral over to orthopedics for further management of his shoulder injury.

## 2012-11-01 ENCOUNTER — Other Ambulatory Visit: Payer: Self-pay | Admitting: Family

## 2012-11-01 ENCOUNTER — Ambulatory Visit (INDEPENDENT_AMBULATORY_CARE_PROVIDER_SITE_OTHER)
Admission: RE | Admit: 2012-11-01 | Discharge: 2012-11-01 | Disposition: A | Discharge status: Home / Self Care | Payer: Medicare HMO | Source: Ambulatory Visit | Attending: Family | Admitting: Family

## 2012-11-01 DIAGNOSIS — S4991XD Unspecified injury of right shoulder and upper arm, subsequent encounter: Secondary | ICD-10-CM

## 2012-11-01 DIAGNOSIS — Z5189 Encounter for other specified aftercare: Secondary | ICD-10-CM

## 2012-11-05 ENCOUNTER — Ambulatory Visit
Admission: RE | Admit: 2012-11-05 | Discharge: 2012-11-05 | Disposition: A | Discharge status: Home / Self Care | Payer: Medicare HMO | Source: Ambulatory Visit | Attending: Family | Admitting: Family

## 2012-11-05 DIAGNOSIS — S4991XS Unspecified injury of right shoulder and upper arm, sequela: Secondary | ICD-10-CM

## 2012-11-05 DIAGNOSIS — M25511 Pain in right shoulder: Secondary | ICD-10-CM

## 2012-11-07 ENCOUNTER — Other Ambulatory Visit: Payer: Self-pay | Admitting: Family

## 2012-11-07 DIAGNOSIS — M719 Bursopathy, unspecified: Secondary | ICD-10-CM

## 2012-11-07 DIAGNOSIS — M67919 Unspecified disorder of synovium and tendon, unspecified shoulder: Secondary | ICD-10-CM

## 2013-02-26 ENCOUNTER — Other Ambulatory Visit: Payer: Self-pay | Admitting: Internal Medicine

## 2013-05-05 ENCOUNTER — Ambulatory Visit (INDEPENDENT_AMBULATORY_CARE_PROVIDER_SITE_OTHER): Payer: Medicare HMO | Admitting: Family Medicine

## 2013-05-05 ENCOUNTER — Encounter: Payer: Self-pay | Admitting: Family Medicine

## 2013-05-05 VITALS — BP 128/82 | HR 94 | Temp 97.9°F | Wt 194.0 lb

## 2013-05-05 DIAGNOSIS — I10 Essential (primary) hypertension: Secondary | ICD-10-CM

## 2013-05-05 DIAGNOSIS — L719 Rosacea, unspecified: Secondary | ICD-10-CM | POA: Insufficient documentation

## 2013-05-05 MED ORDER — FLUTICASONE PROPIONATE 50 MCG/ACT NA SUSP: NASAL | Status: DC

## 2013-05-05 MED ORDER — DOXYCYCLINE HYCLATE 100 MG PO CAPS: 100.0000 mg | ORAL_CAPSULE | Freq: Two times a day (BID) | ORAL | Status: AC

## 2013-05-05 NOTE — Progress Notes (Signed)
  Subjective:    Patient ID: [name] [name], male    DOB: 1961-02-01, 52 y.o.   MRN: [id number]  HPI Here for a reddish rash that appeared over the face about 3 months ago. It does not itch or burn. He also has a wellness form to fill out for his workplace. He had fasting labs done last March.    Review of Systems  Constitutional: Negative.   Respiratory: Negative.   Cardiovascular: Negative.   Skin: Positive for rash.       Objective:   Physical Exam  Constitutional: He appears well-developed and well-nourished.  Cardiovascular: Normal rate, regular rhythm, normal heart sounds and intact distal pulses.   Pulmonary/Chest: Effort normal and breath sounds normal.  Skin:  Macular erythema around the nose, eyes, and mouth           Assessment & Plan:  Treat the rosacea with Doxycycline. The wellness form was filled out. HTN is stable.

## 2013-05-30 ENCOUNTER — Other Ambulatory Visit: Payer: Self-pay | Admitting: Internal Medicine

## 2013-08-03 ENCOUNTER — Encounter (HOSPITAL_COMMUNITY): Payer: Self-pay | Admitting: Pharmacy Technician

## 2013-08-09 ENCOUNTER — Encounter (HOSPITAL_COMMUNITY): Payer: Self-pay

## 2013-08-09 ENCOUNTER — Encounter (HOSPITAL_COMMUNITY)
Admission: RE | Admit: 2013-08-09 | Discharge: 2013-08-09 | Disposition: A | Discharge status: Home / Self Care | Payer: Managed Care, Other (non HMO) | Source: Ambulatory Visit | Attending: Anesthesiology | Admitting: Anesthesiology

## 2013-08-09 ENCOUNTER — Encounter (HOSPITAL_COMMUNITY)
Admission: RE | Admit: 2013-08-09 | Discharge: 2013-08-09 | Disposition: A | Discharge status: Home / Self Care | Payer: Managed Care, Other (non HMO) | Source: Ambulatory Visit | Attending: Orthopedic Surgery | Admitting: Orthopedic Surgery

## 2013-08-09 DIAGNOSIS — Z01812 Encounter for preprocedural laboratory examination: Secondary | ICD-10-CM | POA: Insufficient documentation

## 2013-08-09 DIAGNOSIS — Z01810 Encounter for preprocedural cardiovascular examination: Secondary | ICD-10-CM | POA: Insufficient documentation

## 2013-08-09 DIAGNOSIS — Z01818 Encounter for other preprocedural examination: Secondary | ICD-10-CM | POA: Insufficient documentation

## 2013-08-09 HISTORY — DX: Unspecified osteoarthritis, unspecified site: M19.90

## 2013-08-09 HISTORY — DX: Raynaud's syndrome without gangrene: I73.00

## 2013-08-09 LAB — CBC
HCT: 45.8 % (ref 39.0–52.0)
Hemoglobin: 15.3 g/dL (ref 13.0–17.0)
MCH: 29.5 pg (ref 26.0–34.0)
MCHC: 33.4 g/dL (ref 30.0–36.0)
MCV: 88.2 fL (ref 78.0–100.0)
PLATELETS: 215 10*3/uL (ref 150–400)
RBC: 5.19 MIL/uL (ref 4.22–5.81)
RDW: 13.3 % (ref 11.5–15.5)
WBC: 8.5 10*3/uL (ref 4.0–10.5)

## 2013-08-09 LAB — BASIC METABOLIC PANEL
BUN: 12 mg/dL (ref 6–23)
CHLORIDE: 102 meq/L (ref 96–112)
CO2: 24 mEq/L (ref 19–32)
CREATININE: 1.1 mg/dL (ref 0.50–1.35)
Calcium: 9 mg/dL (ref 8.4–10.5)
GFR calc non Af Amer: 76 mL/min — ABNORMAL LOW (ref >90)
GFR, EST AFRICAN AMERICAN: 88 mL/min — AB (ref >90)
Glucose, Bld: 113 mg/dL — ABNORMAL HIGH (ref 70–99)
Potassium: 3.8 mEq/L (ref 3.7–5.3)
SODIUM: 141 meq/L (ref 137–147)

## 2013-08-09 LAB — ABO/RH: ABO/RH(D): A POS

## 2013-08-09 LAB — TYPE AND SCREEN
ABO/RH(D): A POS
Antibody Screen: NEGATIVE

## 2013-08-09 NOTE — Pre-Procedure Instructions (Signed)
[name] [name]  08/09/2013   Your procedure is scheduled on:  Thursday, August 17, 2013 at 12;30 PM  Report to [facility] (use Main Entrance "A'') at 10:30 AM.  Call this number if you have problems the morning of surgery: [phone]   Remember:   Do not eat food or drink liquids after midnight.   Take these medicines the morning of surgery with A SIP OF WATER: amLODipine (NORVASC) 5 MG tablet, fluticasone (FLONASE) 50 MCG/ACT nasal spray Stop taking Aspirin, vitamins and herbal medications. Do not take any NSAIDs ie: Ibuprofen, Advil, Naproxen or any medication containing Aspirin (diclofenac (VOLTAREN) 75 MG EC tablet).   Do not wear jewelry, make-up or nail polish.  Do not wear lotions, powders, or perfumes. You may NOT wear deodorant.  Do not shave 48 hours prior to surgery. Men may shave face and neck.  Do not bring valuables to the hospital.  [facility] is not responsible for any belongings or valuables.               Contacts, dentures or bridgework may not be worn into surgery.  Leave suitcase in the car. After surgery it may be brought to your room.  For patients admitted to the hospital, discharge time is determined by your treatment team.               Patients discharged the day of surgery will not be allowed to drive home.  Name and phone number of your driver:   Special Instructions: [facility] - Preparing for Surgery  Before surgery, you can play an important role.  Because skin is not sterile, your skin needs to be as free of germs as possible.  You can reduce the number of germs on you skin by washing with CHG (chlorahexidine gluconate) soap before surgery.  CHG is an antiseptic cleaner which kills germs and bonds with the skin to continue killing germs even after washing.  Please DO NOT use if you have an allergy to CHG or antibacterial soaps.  If your skin becomes reddened/irritated stop using the CHG and inform your nurse when you arrive at Short  Stay.  Do not shave (including legs and underarms) for at least 48 hours prior to the first CHG shower.  You may shave your face.  Please follow these instructions carefully:   1.  Shower with CHG Soap the night before surgery and the morning of Surgery.  2.  If you choose to wash your hair, wash your hair first as usual with your normal shampoo.  3.  After you shampoo, rinse your hair and body thoroughly to remove the Shampoo.  4.  Use CHG as you would any other liquid soap.  You can apply chg directly  to the skin and wash gently with scrungie or a clean washcloth.  5.  Apply the CHG Soap to your body ONLY FROM THE NECK DOWN.   Do not use on open wounds or open sores.  Avoid contact with your eyes, ears, mouth and genitals (private parts).  Wash genitals (private parts) with your normal soap.  6.  Wash thoroughly, paying special attention to the area where your surgery will be performed.  7.  Thoroughly rinse your body with warm water from the neck down.  8.  DO NOT shower/wash with your normal soap after using and rinsing off the CHG Soap.  9.  Pat yourself dry with a clean towel.              10.  Wear clean pajamas.            11.  Place clean sheets on your bed the night of your first shower and do not sleep with pets.  Day of Surgery  Do not apply any lotions/deoderants the morning of surgery.  Please wear clean clothes to the hospital/surgery center.     Please read over the following fact sheets that you were given: Pain Booklet, Coughing and Deep Breathing, Blood Transfusion Information and Surgical Site Infection Prevention    

## 2013-08-09 NOTE — Pre-Procedure Instructions (Addendum)
[name] [name]  08/09/2013   Your procedure is scheduled on:  Thursday, August 17, 2013 at 63;30 PM  Report to [facility] (use Main Entrance "A'') at 10:30 AM.  Call this number if you have problems the morning of surgery: [phone]   Remember:   Do not eat food or drink liquids after midnight.   Take these medicines the morning of surgery with A SIP OF WATER: amLODipine (NORVASC) 5 MG tablet, fluticasone (FLONASE) 50 MCG/ACT nasal spray Stop taking Aspirin, vitamins and herbal medications. Do not take any NSAIDs ie: Ibuprofen, Advil, Naproxen or any medication containing Aspirin (diclofenac (VOLTAREN) 75 MG EC tablet).   Do not wear jewelry, make-up or nail polish.  Do not wear lotions, powders, or perfumes. You may NOT wear deodorant.  Do not shave 48 hours prior to surgery. Men may shave face and neck.  Do not bring valuables to the hospital.  [facility] is not responsible for any belongings or valuables.               Contacts, dentures or bridgework may not be worn into surgery.  Leave suitcase in the car. After surgery it may be brought to your room.  For patients admitted to the hospital, discharge time is determined by your treatment team.               Patients discharged the day of surgery will not be allowed to drive home.  Name and phone number of your driver:   Special Instructions: [facility] - Preparing for Surgery  Before surgery, you can play an important role.  Because skin is not sterile, your skin needs to be as free of germs as possible.  You can reduce the number of germs on you skin by washing with CHG (chlorahexidine gluconate) soap before surgery.  CHG is an antiseptic cleaner which kills germs and bonds with the skin to continue killing germs even after washing.  Please DO NOT use if you have an allergy to CHG or antibacterial soaps.  If your skin becomes reddened/irritated stop using the CHG and inform your nurse when you arrive at Short  Stay.  Do not shave (including legs and underarms) for at least 48 hours prior to the first CHG shower.  You may shave your face.  Please follow these instructions carefully:   1.  Shower with CHG Soap the night before surgery and the morning of Surgery.  2.  If you choose to wash your hair, wash your hair first as usual with your normal shampoo.  3.  After you shampoo, rinse your hair and body thoroughly to remove the Shampoo.  4.  Use CHG as you would any other liquid soap.  You can apply chg directly  to the skin and wash gently with scrungie or a clean washcloth.  5.  Apply the CHG Soap to your body ONLY FROM THE NECK DOWN.   Do not use on open wounds or open sores.  Avoid contact with your eyes, ears, mouth and genitals (private parts).  Wash genitals (private parts) with your normal soap.  6.  Wash thoroughly, paying special attention to the area where your surgery will be performed.  7.  Thoroughly rinse your body with warm water from the neck down.  8.  DO NOT shower/wash with your normal soap after using and rinsing off the CHG Soap.  9.  Pat yourself dry with a clean towel.  10.  Wear clean pajamas.            11.  Place clean sheets on your bed the night of your first shower and do not sleep with pets.  Day of Surgery  Do not apply any lotions/deoderants the morning of surgery.  Please wear clean clothes to the hospital/surgery center.     Please read over the following fact sheets that you were given: Pain Booklet, Coughing and Deep Breathing, Blood Transfusion Information and Surgical Site Infection Prevention

## 2013-08-10 NOTE — Progress Notes (Signed)
Anesthesia Chart Review:  Patient is a 53 year old male scheduled for right total shoulder on 08/17/13 by Dr. Onnie Graham.  History includes smoking, HTN, HLD, Raynauds' syndrome, gout, GERD. PCP is listed as Dr. Phoebe Sharps.  EKG on 08/09/13 showed NSR, septal infarct (age undetermined).  He had a previous EKG on 05/26/10 but the actual tracing cannot be viewed in Epic.  Report stated it showed SR with rate variation.  He denied chest pain and SOB at his PAT visit.  He reported a prior stress test but > 10 years ago.  Preoperative CXR and labs noted.  Patient denied CV symptoms at his PAT visit.  He has no reported history of MI/CHF or DM.  Further evaluation by his assigned anesthesiologist on the day of surgery.  He no acute changes or new symptomology then I anticipate that he can proceed as planned.  George Hugh [facility]/Anesthesiology Phone [phone] 08/10/2013 6:15 PM

## 2013-08-16 MED ORDER — LACTATED RINGERS IV SOLN
INTRAVENOUS | Status: DC
  Administered 2013-08-17: 11:00:00 via INTRAVENOUS

## 2013-08-16 MED ORDER — CHLORHEXIDINE GLUCONATE 4 % EX LIQD
60.0000 mL | Freq: Once | CUTANEOUS | Status: DC
  Filled 2013-08-16: qty 60

## 2013-08-16 MED ORDER — CEFAZOLIN SODIUM-DEXTROSE 2-3 GM-% IV SOLR
2.0000 g | INTRAVENOUS | Status: AC
  Administered 2013-08-17: 2 g via INTRAVENOUS
  Filled 2013-08-16: qty 50

## 2013-08-17 ENCOUNTER — Encounter (HOSPITAL_COMMUNITY): Payer: Self-pay | Admitting: *Deleted

## 2013-08-17 ENCOUNTER — Encounter (HOSPITAL_COMMUNITY)
Admission: RE | Disposition: A | Discharge status: Home / Self Care | Payer: Self-pay | Source: Ambulatory Visit | Attending: Orthopedic Surgery

## 2013-08-17 ENCOUNTER — Ambulatory Visit (HOSPITAL_COMMUNITY): Payer: Managed Care, Other (non HMO) | Admitting: Anesthesiology

## 2013-08-17 ENCOUNTER — Encounter (HOSPITAL_COMMUNITY): Payer: Managed Care, Other (non HMO) | Admitting: Vascular Surgery

## 2013-08-17 ENCOUNTER — Inpatient Hospital Stay (HOSPITAL_COMMUNITY)
Admission: RE | Admit: 2013-08-17 | Discharge: 2013-08-18 | DRG: 483 | Disposition: A | Discharge status: Home / Self Care | Payer: Managed Care, Other (non HMO) | Source: Ambulatory Visit | Attending: Orthopedic Surgery | Admitting: Orthopedic Surgery

## 2013-08-17 DIAGNOSIS — I10 Essential (primary) hypertension: Secondary | ICD-10-CM | POA: Diagnosis present

## 2013-08-17 DIAGNOSIS — M109 Gout, unspecified: Secondary | ICD-10-CM | POA: Diagnosis present

## 2013-08-17 DIAGNOSIS — I7300 Raynaud's syndrome without gangrene: Secondary | ICD-10-CM | POA: Diagnosis present

## 2013-08-17 DIAGNOSIS — F17200 Nicotine dependence, unspecified, uncomplicated: Secondary | ICD-10-CM | POA: Diagnosis present

## 2013-08-17 DIAGNOSIS — Z96619 Presence of unspecified artificial shoulder joint: Secondary | ICD-10-CM

## 2013-08-17 DIAGNOSIS — K219 Gastro-esophageal reflux disease without esophagitis: Secondary | ICD-10-CM | POA: Diagnosis present

## 2013-08-17 DIAGNOSIS — E785 Hyperlipidemia, unspecified: Secondary | ICD-10-CM | POA: Diagnosis present

## 2013-08-17 DIAGNOSIS — M19019 Primary osteoarthritis, unspecified shoulder: Principal | ICD-10-CM | POA: Diagnosis present

## 2013-08-17 HISTORY — PX: TOTAL SHOULDER ARTHROPLASTY: SHX126

## 2013-08-17 SURGERY — ARTHROPLASTY, SHOULDER, TOTAL
Anesthesia: Regional | Site: Shoulder | Laterality: Right

## 2013-08-17 MED ORDER — PNEUMOCOCCAL VAC POLYVALENT 25 MCG/0.5ML IJ INJ
0.5000 mL | INJECTION | INTRAMUSCULAR | Status: AC
  Administered 2013-08-18: 0.5 mL via INTRAMUSCULAR
  Filled 2013-08-17: qty 0.5

## 2013-08-17 MED ORDER — CEFAZOLIN SODIUM-DEXTROSE 2-3 GM-% IV SOLR
2.0000 g | Freq: Four times a day (QID) | INTRAVENOUS | Status: AC
  Administered 2013-08-17 – 2013-08-18 (×3): 2 g via INTRAVENOUS
  Filled 2013-08-17 (×3): qty 50

## 2013-08-17 MED ORDER — OXYCODONE-ACETAMINOPHEN 5-325 MG PO TABS
1.0000 | ORAL_TABLET | ORAL | Status: DC | PRN
  Administered 2013-08-17 – 2013-08-18 (×3): 2 via ORAL
  Filled 2013-08-17 (×3): qty 2

## 2013-08-17 MED ORDER — ROCURONIUM BROMIDE 50 MG/5ML IV SOLN
INTRAVENOUS | Status: AC
  Filled 2013-08-17: qty 1

## 2013-08-17 MED ORDER — OXYCODONE HCL 5 MG PO TABS: 5.0000 mg | ORAL_TABLET | Freq: Once | ORAL | Status: DC | PRN

## 2013-08-17 MED ORDER — FENTANYL CITRATE 0.05 MG/ML IJ SOLN
INTRAMUSCULAR | Status: AC
  Filled 2013-08-17: qty 5

## 2013-08-17 MED ORDER — FLUTICASONE PROPIONATE 50 MCG/ACT NA SUSP
1.0000 | Freq: Every day | NASAL | Status: DC
  Administered 2013-08-18 (×2): 1 via NASAL
  Filled 2013-08-17: qty 16

## 2013-08-17 MED ORDER — ARTIFICIAL TEARS OP OINT
TOPICAL_OINTMENT | OPHTHALMIC | Status: DC | PRN
  Administered 2013-08-17: 1 via OPHTHALMIC

## 2013-08-17 MED ORDER — DOCUSATE SODIUM 100 MG PO CAPS
100.0000 mg | ORAL_CAPSULE | Freq: Two times a day (BID) | ORAL | Status: DC
  Administered 2013-08-17 – 2013-08-18 (×2): 100 mg via ORAL
  Filled 2013-08-17 (×2): qty 1

## 2013-08-17 MED ORDER — KETOROLAC TROMETHAMINE 15 MG/ML IJ SOLN
15.0000 mg | Freq: Four times a day (QID) | INTRAMUSCULAR | Status: DC
  Administered 2013-08-17 – 2013-08-18 (×3): 15 mg via INTRAVENOUS
  Filled 2013-08-17 (×7): qty 1

## 2013-08-17 MED ORDER — BUPIVACAINE-EPINEPHRINE PF 0.5-1:200000 % IJ SOLN
INTRAMUSCULAR | Status: DC | PRN
  Administered 2013-08-17: 30 mL via PERINEURAL

## 2013-08-17 MED ORDER — PHENOL 1.4 % MT LIQD: 1.0000 | OROMUCOSAL | Status: DC | PRN

## 2013-08-17 MED ORDER — ACETAMINOPHEN 325 MG PO TABS: 650.0000 mg | ORAL_TABLET | Freq: Four times a day (QID) | ORAL | Status: DC | PRN

## 2013-08-17 MED ORDER — AMLODIPINE BESYLATE 5 MG PO TABS
5.0000 mg | ORAL_TABLET | Freq: Every day | ORAL | Status: DC
  Administered 2013-08-18: 5 mg via ORAL
  Filled 2013-08-17: qty 1

## 2013-08-17 MED ORDER — MIDAZOLAM HCL 2 MG/2ML IJ SOLN
INTRAMUSCULAR | Status: AC
  Administered 2013-08-17: 2 mg
  Filled 2013-08-17: qty 2

## 2013-08-17 MED ORDER — ALUM & MAG HYDROXIDE-SIMETH 200-200-20 MG/5ML PO SUSP: 30.0000 mL | ORAL | Status: DC | PRN

## 2013-08-17 MED ORDER — BISACODYL 5 MG PO TBEC: 5.0000 mg | DELAYED_RELEASE_TABLET | Freq: Every day | ORAL | Status: DC | PRN

## 2013-08-17 MED ORDER — ONDANSETRON HCL 4 MG/2ML IJ SOLN: 4.0000 mg | Freq: Four times a day (QID) | INTRAMUSCULAR | Status: DC | PRN

## 2013-08-17 MED ORDER — FENTANYL CITRATE 0.05 MG/ML IJ SOLN
INTRAMUSCULAR | Status: DC | PRN
  Administered 2013-08-17 (×2): 50 ug via INTRAVENOUS

## 2013-08-17 MED ORDER — DIPHENHYDRAMINE HCL 12.5 MG/5ML PO ELIX: 12.5000 mg | ORAL_SOLUTION | ORAL | Status: DC | PRN

## 2013-08-17 MED ORDER — LACTATED RINGERS IV SOLN
INTRAVENOUS | Status: DC
  Administered 2013-08-17 – 2013-08-18 (×2): via INTRAVENOUS

## 2013-08-17 MED ORDER — METOCLOPRAMIDE HCL 5 MG/ML IJ SOLN: 5.0000 mg | Freq: Three times a day (TID) | INTRAMUSCULAR | Status: DC | PRN

## 2013-08-17 MED ORDER — POLYETHYLENE GLYCOL 3350 17 G PO PACK: 17.0000 g | PACK | Freq: Every day | ORAL | Status: DC | PRN

## 2013-08-17 MED ORDER — ACETAMINOPHEN 650 MG RE SUPP: 650.0000 mg | Freq: Four times a day (QID) | RECTAL | Status: DC | PRN

## 2013-08-17 MED ORDER — ONDANSETRON HCL 4 MG/2ML IJ SOLN
INTRAMUSCULAR | Status: AC
  Filled 2013-08-17: qty 2

## 2013-08-17 MED ORDER — ONDANSETRON HCL 4 MG/2ML IJ SOLN
INTRAMUSCULAR | Status: DC | PRN
  Administered 2013-08-17: 4 mg via INTRAVENOUS

## 2013-08-17 MED ORDER — SODIUM CHLORIDE 0.9 % IR SOLN
Status: DC | PRN
  Administered 2013-08-17: 1000 mL

## 2013-08-17 MED ORDER — ATORVASTATIN CALCIUM 20 MG PO TABS
20.0000 mg | ORAL_TABLET | Freq: Every day | ORAL | Status: DC
  Administered 2013-08-18: 20 mg via ORAL
  Filled 2013-08-17: qty 1

## 2013-08-17 MED ORDER — ROCURONIUM BROMIDE 100 MG/10ML IV SOLN
INTRAVENOUS | Status: DC | PRN
  Administered 2013-08-17: 50 mg via INTRAVENOUS

## 2013-08-17 MED ORDER — OXYCODONE HCL 5 MG/5ML PO SOLN: 5.0000 mg | Freq: Once | ORAL | Status: DC | PRN

## 2013-08-17 MED ORDER — ONDANSETRON HCL 4 MG PO TABS
4.0000 mg | ORAL_TABLET | Freq: Four times a day (QID) | ORAL | Status: DC | PRN
Start: 2013-08-17 — End: 2013-08-18

## 2013-08-17 MED ORDER — HYDROMORPHONE HCL PF 1 MG/ML IJ SOLN: 0.2500 mg | INTRAMUSCULAR | Status: DC | PRN

## 2013-08-17 MED ORDER — PROPOFOL 10 MG/ML IV BOLUS
INTRAVENOUS | Status: AC
  Filled 2013-08-17: qty 20

## 2013-08-17 MED ORDER — MENTHOL 3 MG MT LOZG: 1.0000 | LOZENGE | OROMUCOSAL | Status: DC | PRN

## 2013-08-17 MED ORDER — DEXTROSE 5 % IV SOLN
10.0000 mg | INTRAVENOUS | Status: DC | PRN
  Administered 2013-08-17: 20 ug/min via INTRAVENOUS

## 2013-08-17 MED ORDER — PROPOFOL 10 MG/ML IV BOLUS
INTRAVENOUS | Status: DC | PRN
  Administered 2013-08-17: 180 mg via INTRAVENOUS

## 2013-08-17 MED ORDER — METOCLOPRAMIDE HCL 10 MG PO TABS: 5.0000 mg | ORAL_TABLET | Freq: Three times a day (TID) | ORAL | Status: DC | PRN

## 2013-08-17 MED ORDER — FENTANYL CITRATE 0.05 MG/ML IJ SOLN
INTRAMUSCULAR | Status: AC
  Administered 2013-08-17: 100 ug
  Filled 2013-08-17: qty 2

## 2013-08-17 MED ORDER — DIAZEPAM 5 MG PO TABS: 2.5000 mg | ORAL_TABLET | Freq: Four times a day (QID) | ORAL | Status: DC | PRN

## 2013-08-17 MED ORDER — GLYCOPYRROLATE 0.2 MG/ML IJ SOLN
INTRAMUSCULAR | Status: DC | PRN
  Administered 2013-08-17: 0.6 mg via INTRAVENOUS

## 2013-08-17 MED ORDER — EPHEDRINE SULFATE 50 MG/ML IJ SOLN
INTRAMUSCULAR | Status: DC | PRN
  Administered 2013-08-17: 5 mg via INTRAVENOUS

## 2013-08-17 MED ORDER — LACTATED RINGERS IV SOLN
INTRAVENOUS | Status: DC | PRN
  Administered 2013-08-17 (×2): via INTRAVENOUS

## 2013-08-17 MED ORDER — FLEET ENEMA 7-19 GM/118ML RE ENEM: 1.0000 | ENEMA | Freq: Once | RECTAL | Status: AC | PRN

## 2013-08-17 MED ORDER — LIDOCAINE HCL (CARDIAC) 20 MG/ML IV SOLN
INTRAVENOUS | Status: DC | PRN
  Administered 2013-08-17: 70 mg via INTRAVENOUS

## 2013-08-17 MED ORDER — FENTANYL CITRATE 0.05 MG/ML IJ SOLN: 25.0000 ug | INTRAMUSCULAR | Status: DC | PRN

## 2013-08-17 MED ORDER — NEOSTIGMINE METHYLSULFATE 1 MG/ML IJ SOLN
INTRAMUSCULAR | Status: DC | PRN
  Administered 2013-08-17: 4 mg via INTRAVENOUS

## 2013-08-17 MED ORDER — LIDOCAINE HCL (CARDIAC) 20 MG/ML IV SOLN
INTRAVENOUS | Status: AC
  Filled 2013-08-17: qty 5

## 2013-08-17 SURGICAL SUPPLY — 60 items
BLADE SAW SGTL 83.5X18.5 (BLADE) ×2 IMPLANT
CEMENT BONE DEPUY (Cement) ×2 IMPLANT
CLOTH BEACON ORANGE TIMEOUT ST (SAFETY) ×2 IMPLANT
CLSR STERI-STRIP ANTIMIC 1/2X4 (GAUZE/BANDAGES/DRESSINGS) ×2 IMPLANT
COVER SURGICAL LIGHT HANDLE (MISCELLANEOUS) ×2 IMPLANT
DRAPE INCISE IOBAN 66X45 STRL (DRAPES) ×2 IMPLANT
DRAPE SURG 17X11 SM STRL (DRAPES) ×2 IMPLANT
DRAPE SURG 17X23 STRL (DRAPES) ×2 IMPLANT
DRAPE U-SHAPE 47X51 STRL (DRAPES) ×2 IMPLANT
DRILL BIT 7/64X5 (BIT) IMPLANT
DRSG AQUACEL AG ADV 3.5X10 (GAUZE/BANDAGES/DRESSINGS) ×2 IMPLANT
DRSG MEPILEX BORDER 4X8 (GAUZE/BANDAGES/DRESSINGS) ×2 IMPLANT
DURAPREP 26ML APPLICATOR (WOUND CARE) ×4 IMPLANT
ELECT BLADE 4.0 EZ CLEAN MEGAD (MISCELLANEOUS) ×2
ELECT CAUTERY BLADE 6.4 (BLADE) ×2 IMPLANT
ELECT REM PT RETURN 9FT ADLT (ELECTROSURGICAL) ×2
ELECTRODE BLDE 4.0 EZ CLN MEGD (MISCELLANEOUS) ×1 IMPLANT
ELECTRODE REM PT RTRN 9FT ADLT (ELECTROSURGICAL) ×1 IMPLANT
FACESHIELD LNG OPTICON STERILE (SAFETY) ×6 IMPLANT
GLENOID ANCHOR PEG CROSSLK 44 (Orthopedic Implant) ×2 IMPLANT
GLOVE BIO SURGEON STRL SZ7.5 (GLOVE) ×2 IMPLANT
GLOVE BIO SURGEON STRL SZ8 (GLOVE) ×2 IMPLANT
GLOVE EUDERMIC 7 POWDERFREE (GLOVE) ×2 IMPLANT
GLOVE SS BIOGEL STRL SZ 7.5 (GLOVE) ×1 IMPLANT
GLOVE SUPERSENSE BIOGEL SZ 7.5 (GLOVE) ×1
GOWN STRL NON-REIN LRG LVL3 (GOWN DISPOSABLE) ×2 IMPLANT
GOWN STRL REIN XL XLG (GOWN DISPOSABLE) ×4 IMPLANT
HEAD HUM ECCENTRIC 48X18 STRL (Trauma) ×2 IMPLANT
KIT BASIN OR (CUSTOM PROCEDURE TRAY) ×2 IMPLANT
KIT ROOM TURNOVER OR (KITS) ×2 IMPLANT
MANIFOLD NEPTUNE II (INSTRUMENTS) ×2 IMPLANT
NDL SUT 6 .5 CRC .975X.05 MAYO (NEEDLE) ×1 IMPLANT
NEEDLE HYPO 25GX1X1/2 BEV (NEEDLE) IMPLANT
NEEDLE MAYO TAPER (NEEDLE) ×1
NS IRRIG 1000ML POUR BTL (IV SOLUTION) ×2 IMPLANT
PACK SHOULDER (CUSTOM PROCEDURE TRAY) ×2 IMPLANT
PAD ARMBOARD 7.5X6 YLW CONV (MISCELLANEOUS) ×4 IMPLANT
PASSER SUT SWANSON 36MM LOOP (INSTRUMENTS) IMPLANT
PIN METAGLENE 2.5 (PIN) ×2 IMPLANT
SLING ARM LRG ADULT FOAM STRAP (SOFTGOODS) IMPLANT
SLING ARM XL FOAM STRAP (SOFTGOODS) ×2 IMPLANT
SMARTMIX MINI TOWER (MISCELLANEOUS) ×2
SPONGE LAP 18X18 X RAY DECT (DISPOSABLE) ×2 IMPLANT
SPONGE LAP 4X18 X RAY DECT (DISPOSABLE) ×2 IMPLANT
STEM HUMERAL 12MM (Trauma) ×2 IMPLANT
STRIP CLOSURE SKIN 1/2X4 (GAUZE/BANDAGES/DRESSINGS) ×2 IMPLANT
SUCTION FRAZIER TIP 10 FR DISP (SUCTIONS) ×2 IMPLANT
SUT BONE WAX W31G (SUTURE) IMPLANT
SUT FIBERWIRE #2 38 T-5 BLUE (SUTURE) ×4
SUT MNCRL AB 3-0 PS2 18 (SUTURE) ×2 IMPLANT
SUT VIC AB 1 CT1 27 (SUTURE) ×3
SUT VIC AB 1 CT1 27XBRD ANBCTR (SUTURE) ×3 IMPLANT
SUT VIC AB 2-0 CT1 27 (SUTURE) ×2
SUT VIC AB 2-0 CT1 TAPERPNT 27 (SUTURE) ×2 IMPLANT
SUTURE FIBERWR #2 38 T-5 BLUE (SUTURE) ×2 IMPLANT
SYR CONTROL 10ML LL (SYRINGE) IMPLANT
TOWEL OR 17X24 6PK STRL BLUE (TOWEL DISPOSABLE) ×2 IMPLANT
TOWEL OR 17X26 10 PK STRL BLUE (TOWEL DISPOSABLE) ×2 IMPLANT
TOWER SMARTMIX MINI (MISCELLANEOUS) ×1 IMPLANT
WATER STERILE IRR 1000ML POUR (IV SOLUTION) ×2 IMPLANT

## 2013-08-17 NOTE — Discharge Instructions (Signed)
° °  Kevin M. Supple, M.D., F.A.A.O.S. °Orthopaedic Surgery °Specializing in Arthroscopic and Reconstructive °Surgery of the Shoulder and Knee °[phone] °[address] - [place], [place] [address] - Fax [phone] ° ° °POST-OP TOTAL SHOULDER REPLACEMENT/SHOULDER HEMIARTHROPLASTY INSTRUCTIONS ° °1. Call the office at [phone] to schedule your first post-op appointment 10-14 days from the date of your surgery. ° °2. The bandage over your incision is waterproof. You may begin showering with this dressing on. You may leave this dressing on until first follow up appointment within 2 weeks. If you would like to remove it you may do so after the 5th day. Go slow and tug at the borders gently to break the bond the dressing has with the skin. The steri strips may come off with the dressing. At this point if there is no drainage it is okay to go without a bandage or you may cover it with a light guaze and tape. Leave the steri-strips in place over your incision. You can expect drainage that is bloody or yellow in nature that should gradually decrease from day of surgery. Change your dressing daily until drainage is completely resolved, then you may feel free to go without a bandage. You can also expect significant bruising around your shoulder that will drift down your arm and into your chest wall. This is very normal and should resolve over several days. ° ° 3. Wear your sling/immobilizer at all times except to perform the exercises below or to occasionally let your arm dangle by your side to stretch your elbow. You also need to sleep in your sling immobilizer until instructed otherwise. ° °4. Range of motion to your elbow, wrist, and hand are encouraged 3-5 times daily. Exercise to your hand and fingers helps to reduce swelling you may experience. ° °5. Utilize ice to the shoulder 3-5 times minimum a day and additionally if you are experiencing pain. ° °6. Prescriptions for a pain medication and a muscle  relaxant are provided for you. It is recommended that if you are experiencing pain that you pain medication alone is not controlling, add the muscle relaxant along with the pain medication which can give additional pain relief. The first 1-2 days is generally the most severe of your pain and then should gradually decrease. As your pain lessens it is recommended that you decrease your use of the pain medications to an "as needed basis'" only and to always comply with the recommended dosages of the pain medications. ° °7. Pain medications can produce constipation along with their use. If you experience this, the use of an over the counter stool softener or laxative daily is recommended.  ° °8. For most patients, if insurance allows, home health services to include therapy has been arranged. ° °9. For additional questions or concerns, please do not hesitate to call the office. If after hours there is an answering service to forward your concerns to the physician on call. ° °POST-OP EXERCISES ° °Pendulum Exercises ° °Perform pendulum exercises while standing and bending at the waist. Support your uninvolved arm on a table or chair and allow your operated arm to hang freely. Make sure to do these exercises passively - not using you shoulder muscles. ° °Repeat 20 times. Do 3 sessions per day. ° ° ° ° °

## 2013-08-17 NOTE — Anesthesia Preprocedure Evaluation (Signed)
Anesthesia Evaluation  Patient identified by MRN, date of birth, ID band Patient awake    Reviewed: Allergy & Precautions, H&P , NPO status , Patient's Chart, lab work & pertinent test results  Airway Mallampati: II  Neck ROM: full    Dental   Pulmonary Current Smoker,          Cardiovascular hypertension, + Peripheral Vascular Disease     Neuro/Psych    GI/Hepatic GERD-  ,  Endo/Other    Renal/GU      Musculoskeletal  (+) Arthritis -,   Abdominal   Peds  Hematology   Anesthesia Other Findings   Reproductive/Obstetrics                           Anesthesia Physical Anesthesia Plan  ASA: II  Anesthesia Plan: General and Regional   Post-op Pain Management: MAC Combined w/ Regional for Post-op pain   Induction: Intravenous  Airway Management Planned: Oral ETT  Additional Equipment:   Intra-op Plan:   Post-operative Plan: Extubation in OR  Informed Consent: I have reviewed the patients History and Physical, chart, labs and discussed the procedure including the risks, benefits and alternatives for the proposed anesthesia with the patient or authorized representative who has indicated his/her understanding and acceptance.     Plan Discussed with: CRNA, Anesthesiologist and Surgeon  Anesthesia Plan Comments:         Anesthesia Quick Evaluation

## 2013-08-17 NOTE — H&P (Signed)
[name] [name]    Chief Complaint: osterarthritis of the right shoulder HPI: The patient is a 53 y.o. male with end stage right shoulder osteoarthritis  Past Medical History  Diagnosis Date  . Hypertension   . Hyperlipidemia   . Gout   . GERD (gastroesophageal reflux disease)   . Arthritis   . Raynaud's syndrome     Hx: of    Past Surgical History  Procedure Laterality Date  . Biceps tendon repair      Family History  Problem Relation Age of Onset  . Coronary artery disease    . Diabetes    . Hypertension    . Hyperlipidemia Mother   . Heart disease Father     Social History:  reports that he has been smoking Cigarettes.  He has been smoking about 0.00 packs per day. He has never used smokeless tobacco. He reports that he drinks alcohol. He reports that he does not use illicit drugs.  Allergies:  Allergies  Allergen Reactions  . Lisinopril Cough    cough    Medications Prior to Admission  Medication Sig Dispense Refill  . amLODipine (NORVASC) 5 MG tablet Take 5 mg by mouth daily.      Marland Kitchen atorvastatin (LIPITOR) 20 MG tablet Take 20 mg by mouth daily.      . diclofenac (VOLTAREN) 75 MG EC tablet Take 75 mg by mouth daily.       Marland Kitchen doxycycline (VIBRAMYCIN) 100 MG capsule Take 100 mg by mouth 2 (two) times daily.      . fluticasone (FLONASE) 50 MCG/ACT nasal spray Place 1 spray into both nostrils daily.         Physical Exam: right shoulder with painful and restricted motion as noted at recent office visits  Vitals  Temp:  [97.7 F (36.5 C)] 97.7 F (36.5 C) (02/12 1035) Pulse Rate:  [78] 78 (02/12 1035) Resp:  [18] 18 (02/12 1035) BP: (169)/(64) 169/64 mmHg (02/12 1035) SpO2:  [98 %] 98 % (02/12 1035)  Assessment/Plan  Impression: osterarthritis of the right shoulder  Plan of Action: Procedure(s): RIGHT TOTAL SHOULDER ARTHROPLASTY  Mekaila Tarnow M 08/17/2013, 11:58 AM

## 2013-08-17 NOTE — Preoperative (Signed)
Beta Blockers   Reason not to administer Beta Blockers:Not Applicable 

## 2013-08-17 NOTE — Anesthesia Postprocedure Evaluation (Signed)
Anesthesia Post Note  Patient: [name] [name]  Procedure(s) Performed: Procedure(s) (LRB): RIGHT TOTAL SHOULDER ARTHROPLASTY (Right)  Anesthesia type: General  Patient location: PACU  Post pain: Pain level controlled and Adequate analgesia  Post assessment: Post-op Vital signs reviewed, Patient's Cardiovascular Status Stable, Respiratory Function Stable, Patent Airway and Pain level controlled  Last Vitals:  Filed Vitals:   08/17/13 1501  BP: 138/81  Pulse: 63  Temp:   Resp: 17    Post vital signs: Reviewed and stable  Level of consciousness: awake, alert  and oriented  Complications: No apparent anesthesia complications

## 2013-08-17 NOTE — Transfer of Care (Signed)
Immediate Anesthesia Transfer of Care Note  Patient: [name] [name]  Procedure(s) Performed: Procedure(s): RIGHT TOTAL SHOULDER ARTHROPLASTY (Right)  Patient Location: PACU  Anesthesia Type:General and Regional  Level of Consciousness: awake, alert  and oriented  Airway & Oxygen Therapy: Patient Spontanous Breathing  Post-op Assessment: Report given to PACU RN  Post vital signs: Reviewed and stable  Complications: No apparent anesthesia complications

## 2013-08-17 NOTE — Op Note (Signed)
08/17/2013  2:43 PM  PATIENT:   [name] [name]  53 y.o. male  PRE-OPERATIVE DIAGNOSIS:  osterarthritis of the right shoulder  POST-OPERATIVE DIAGNOSIS:  same  PROCEDURE:  Right total shoulder arthroplasty #12 stem, 48x18 eccentric head, 44 glenoid  SURGEON:  Patrese Neal, Metta Clines M.D.  ASSISTANTS: Shuford pac   ANESTHESIA:   GET + ISB  EBL: 200  SPECIMEN:  none  Drains: none   PATIENT DISPOSITION:  PACU - hemodynamically stable.    PLAN OF CARE: Admit to inpatient   Dictation# [id number]

## 2013-08-17 NOTE — Anesthesia Procedure Notes (Signed)
Anesthesia Regional Block:  Interscalene brachial plexus block  Pre-Anesthetic Checklist: ,, timeout performed, Correct Patient, Correct Site, Correct Laterality, Correct Procedure, Correct Position, site marked, Risks and benefits discussed,  Surgical consent,  Pre-op evaluation,  At surgeon's request and post-op pain management  Laterality: Right  Prep: chloraprep       Needles:  Injection technique: Single-shot  Needle Type: Echogenic Stimulator Needle     Needle Length: 5cm 5 cm Needle Gauge: 22 and 22 G    Additional Needles:  Procedures: ultrasound guided (picture in chart) and nerve stimulator Interscalene brachial plexus block  Nerve Stimulator or Paresthesia:  Response: biceps flexion, 0.45 mA,   Additional Responses:   Narrative:  Start time: 08/17/2013 11:30 AM End time: 08/17/2013 11:43 AM Injection made incrementally with aspirations every 5 mL.  Performed by: Personally  Anesthesiologist: Dr Marcie Bal  Additional Notes: Functioning IV was confirmed and monitors were applied.  A 64mm 22ga Arrow echogenic stimulator needle was used. Sterile prep and drape,hand hygiene and sterile gloves were used.  Negative aspiration and negative test dose prior to incremental administration of local anesthetic. The patient tolerated the procedure well.  Ultrasound guidance: relevent anatomy identified, needle position confirmed, local anesthetic spread visualized around nerve(s), vascular puncture avoided.  Image printed for medical record.

## 2013-08-18 ENCOUNTER — Encounter (HOSPITAL_COMMUNITY): Payer: Self-pay | Admitting: General Practice

## 2013-08-18 MED ORDER — OXYCODONE-ACETAMINOPHEN 5-325 MG PO TABS: 1.0000 | ORAL_TABLET | ORAL | Status: DC | PRN

## 2013-08-18 MED ORDER — DIAZEPAM 5 MG PO TABS: 2.5000 mg | ORAL_TABLET | Freq: Four times a day (QID) | ORAL | Status: DC | PRN

## 2013-08-18 NOTE — Discharge Summary (Signed)
PATIENT ID:      [name] [name]  MRN:     [id number] DOB/AGE:    Jan 10, 1961 / 53 y.o.     DISCHARGE SUMMARY  ADMISSION DATE:    08/17/2013 DISCHARGE DATE:    ADMISSION DIAGNOSIS: osterarthritis of the right shoulder Past Medical History  Diagnosis Date  . Hypertension   . Hyperlipidemia   . Gout   . GERD (gastroesophageal reflux disease)   . Raynaud's syndrome     Hx: of  . Arthritis     "right shoulder" (08/18/2013)    DISCHARGE DIAGNOSIS:   Active Problems:   S/P shoulder replacement   PROCEDURE: Procedure(s): RIGHT TOTAL SHOULDER ARTHROPLASTY on 08/17/2013  CONSULTS:   none  HISTORY:  See H&P in chart.  HOSPITAL COURSE:  [name] [name] is a 53 y.o. admitted on 08/17/2013 with a chief complaint of right shoulder pain and dysfuncyion, and found to have a diagnosis of osterarthritis of the right shoulder.  They were brought to the operating room on 08/17/2013 and underwent Procedure(s): RIGHT TOTAL SHOULDER ARTHROPLASTY.    They were given perioperative antibiotics: Anti-infectives   Start     Dose/Rate Route Frequency Ordered Stop   08/17/13 1715  ceFAZolin (ANCEF) IVPB 2 g/50 mL premix     2 g 100 mL/hr over 30 Minutes Intravenous Every 6 hours 08/17/13 1647 08/18/13 0551   08/17/13 0600  ceFAZolin (ANCEF) IVPB 2 g/50 mL premix     2 g 100 mL/hr over 30 Minutes Intravenous On call to O.R. 08/16/13 1432 08/17/13 1248    .  Patient underwent the above named procedure and tolerated it well. The following day they were hemodynamically stable and pain was controlled on oral analgesics. They were neurovascularly intact to the operative extremity. OT was ordered and worked with patient per protocol. They were medically and orthopaedically stable for discharge on day 1 . He had some early bleeding through his bandages and his aquacel was changed the following day and his incision was without active bleeding at that time.  DIAGNOSTIC STUDIES:  RECENT RADIOGRAPHIC  STUDIES :  Dg Chest 2 View  08/09/2013   CLINICAL DATA:  Hypertension. Right shoulder arthroplasty preoperative chest x-ray .  EXAM: CHEST  2 VIEW  COMPARISON:  None.  FINDINGS: Mediastinum and hilar structures are normal. The lungs are clear. No pleural effusion or pneumothorax. Heart size normal. Degenerative changes thoracic spine. Degenerative changes both shoulders. Calcific density projected over the right upper quadrant. This appears to be posterior, this could represent a kidney stone.  IMPRESSION: 1. No acute cardiopulmonary disease. 2. Degenerative changes thoracic spine and both shoulders. 3. Possible right kidney stone.   Electronically Signed   By: Marcello Moores  Register   On: 08/09/2013 11:07    RECENT VITAL SIGNS:  Patient Vitals for the past 24 hrs:  BP Temp Temp src Pulse Resp SpO2  08/18/13 0541 109/66 mmHg 97.3 F (36.3 C) Oral 69 20 94 %  08/17/13 2137 127/69 mmHg 97.6 F (36.4 C) Oral 71 20 96 %  08/17/13 1630 133/79 mmHg 97.7 F (36.5 C) Oral 68 16 96 %  08/17/13 1621 - 97.7 F (36.5 C) - - - -  08/17/13 1616 141/81 mmHg - - 58 14 99 %  08/17/13 1615 - - - 71 20 99 %  08/17/13 1601 133/77 mmHg - - 63 15 98 %  08/17/13 1600 - - - 62 16 98 %  08/17/13 1546 134/79 mmHg - - 64 17 100 %  08/17/13 1545 - - - 64 14 98 %  08/17/13 1531 139/77 mmHg - - 60 16 99 %  08/17/13 1530 - - - 65 16 99 %  08/17/13 1516 140/83 mmHg - - 65 18 97 %  08/17/13 1515 - - - 66 20 97 %  08/17/13 1501 138/81 mmHg - - 63 17 97 %  08/17/13 1500 - 97.8 F (36.6 C) - 75 24 99 %  08/17/13 1205 140/79 mmHg - - 82 18 97 %  08/17/13 1200 145/73 mmHg - - 82 18 97 %  08/17/13 1155 141/82 mmHg - - 89 17 98 %  08/17/13 1150 144/80 mmHg - - 84 16 98 %  08/17/13 1145 142/85 mmHg - - 72 13 98 %  08/17/13 1140 - - - 72 16 99 %  08/17/13 1135 - - - 72 14 97 %  08/17/13 1130 - - - 69 13 97 %  08/17/13 1125 - - - 71 17 97 %  08/17/13 1120 - - - 73 14 96 %  08/17/13 1115 - - - 75 13 97 %  08/17/13 1110 - - - 70  14 97 %  08/17/13 1105 - - - 71 14 97 %  08/17/13 1100 - - - 74 14 98 %  08/17/13 1055 - - - 70 13 98 %  08/17/13 1050 - - - 76 14 98 %  08/17/13 1035 169/64 mmHg 97.7 F (36.5 C) Oral 78 18 98 %  .  RECENT EKG RESULTS:    Orders placed during the hospital encounter of 08/09/13  . EKG 12-LEAD  . EKG 12-LEAD    DISCHARGE INSTRUCTIONS:    DISCHARGE MEDICATIONS:     Medication List         amLODipine 5 MG tablet  Commonly known as:  NORVASC  Take 5 mg by mouth daily.     atorvastatin 20 MG tablet  Commonly known as:  LIPITOR  Take 20 mg by mouth daily.     diazepam 5 MG tablet  Commonly known as:  VALIUM  Take 0.5-1 tablets (2.5-5 mg total) by mouth every 6 (six) hours as needed for muscle spasms or sedation.     diclofenac 75 MG EC tablet  Commonly known as:  VOLTAREN  Take 75 mg by mouth daily.     doxycycline 100 MG capsule  Commonly known as:  VIBRAMYCIN  Take 100 mg by mouth 2 (two) times daily.     fluticasone 50 MCG/ACT nasal spray  Commonly known as:  FLONASE  Place 1 spray into both nostrils daily.     oxyCODONE-acetaminophen 5-325 MG per tablet  Commonly known as:  PERCOCET  Take 1-2 tablets by mouth every 4 (four) hours as needed.        FOLLOW UP VISIT:       Follow-up Information   Follow up with [name], MD. (call to be seen in 10-14 days)    Specialty:  Orthopedic Surgery   Contact information:   [address] [place] [address] [phone]       DISCHARGE TO: home   DISPOSITION: good  DISCHARGE CONDITION:  [name] for Dr. Justice Britain 08/18/2013, 9:05 AM

## 2013-08-18 NOTE — Op Note (Signed)
NAMEALDEN, BENSINGER NO.:  [id number]  MEDICAL RECORD NO.:  [id number]  LOCATION:  5N01C                        FACILITY:  [facility]  PHYSICIAN:  Metta Clines. Ahmani Prehn, M.D.  DATE OF BIRTH:  [birth year]  DATE OF PROCEDURE:  08/17/2013 DATE OF DISCHARGE:                              OPERATIVE REPORT   PREOPERATIVE DIAGNOSIS:  End-stage right shoulder glenohumeral joint osteoarthritis.  POSTOPERATIVE DIAGNOSIS:  End-stage right shoulder glenohumeral joint osteoarthritis.  PROCEDURE:  Right total shoulder arthroplasty utilizing a press-fit size 12 [employer] stem with a 48 x 18 eccentric head and a 44 cemented pegged glenoid.  SURGEON:  Metta Clines. Lopez Dentinger, M.D.  Terrence DupontOlivia Mackie A. Shuford, P.A.-C.  ANESTHESIA:  General endotracheal as well as an interscalene block.  ESTIMATED BLOOD LOSS:  200 mL.  DRAINS:  None.  HISTORY:  Mr.  Pizzo is a 53 year old gentleman, who has had chronic and progressive increasing right shoulder pain related to end- stage osteoarthritis.  His symptoms have progressed despite multiple and prolonged attempts at conservative management.  He is now to the point, where he is unable and unwilling to live with the shoulder pain the way that it is.  He has been counseled at length regarding treatment options and risks versus benefits thereof.  Possible surgical complications were reviewed including the potential for bleeding, infection, neurovascular injury, persistent pain, loss of motion, anesthetic complication, failure of the implant, and possible need for revision surgery.  He understands and accepts and agrees to the planned procedure.  PROCEDURE IN DETAIL:  After undergoing routine preop evaluation, the patient received prophylactic antibiotics and an interscalene block was established in the holding area by the Anesthesia Department.  Placed supine on the operating table, underwent smooth induction of the general endotracheal  anesthesia.  Placed in beach-chair position and appropriately padded and protected.  The right shoulder girdle region was sterilely prepped and draped in standard fashion.  Time-out was called.  I should mention that on his initial inspection, he did have obvious atrophy at the anterior deltoid related to a previous surgical procedure, which apparently had compromised the axillary nerve and so was noted to be chronically atrophied.  We proceeded with a deltopectoral approach to the right shoulder through an incision 12 cm in length, beginning at the coracoid extending laterally and distally. Skin flaps were elevated and electrocautery was used for hemostasis. Dissection was carried deeply, where the cephalic vein was identified, retracted laterally with the deltoid, and pectoralis major was mobilized retracted medially.  I did tenotomize the upper 2 cm of thick major tendon to enhance exposure and visualization.  There were dense adhesions beneath the deltoid, particularly anteriorly from a previous open biceps tenodesis.  Now, these adhesions were carefully divided and did visualize as well and we had previously performed supra pectoral biceps tenodesis.  We mobilized and then divided the adhesions gaining exposure in the entirety of the subacromial/subdeltoid space.  The conjoined tendon was then identified, mobilized, and retracted medially, and self-retaining __________ retractors were placed.  At this point, I made a longitudinal split along the course of the rotator interval to the base of the coracoid and then divided the subscapularis away from its attachment to  the lesser tuberosity, leaving a 1 cm cuff of tissue for later repair.  The subscapularis tendon and free margin was then tagged with a series of grasping #2 FiberWire sutures.  I then divided the capsule tissues anteriorly and inferiorly, allowing delivery of the humeral head through the incision and carefully protecting  the rotator cuff superiorly and posteriorly.  The humeral head was noted to be significantly degenerated.  Using the extramedullary guide, we went ahead and performed a humeral head resection using the oscillating saw. We then used the hand-held reamers to gain access at the humeral medullary canal and reaming up to size 12.  I then used a rongeur to remove the osteophytes, which had developed on the anterior and inferior aspects of the humeral head.  I then performed sequential broachings of the proximal humerus after we had placed our initial cookie cutter guide and broached up to size 12, maintaining the  native retroversion, which was approximately 30 degrees retroversion.  After we finished preparation of the humeral canal, placed a metal cap over the cut proximal humeral surface and then exposed the glenoid with a combination of pitchfork, snake tongue and Fukuda retractors.  He is a heavily muscled, large muscled gentleman, so exposure was quite challenging and we did need to perform a circumferential labral and capsular release around the glenoid and performed a number of soft tissue releases in order to gain access to the glenoid appropriately.  A guide pin was placed in the center of the glenoid and reamed the glenoid with a 44 reamer, which showed the best coverage.  We then placed a central drill hole and we used the appropriate guide and placed our peripheral peg holes.  The glenoid was copiously irrigated, cleaned and dried.  Trial showed good fit.  At this point, the cement was mixed in the back table in appropriate consistency.  It was introduced into the peripheral peg holes and the final 44 pegged glenoid was impacted into position with excellent fit and fixation.  After the cement had hardened, we then returned our attention to the proximal humerus, where we impacted our humeral stem size 12 to the appropriate depth with the excellent fit and fixation, using some bone  graft harvested from the resected humeral head to bone graft the stem as it was impacted.  At this point, we then performed a series of trial reductions and the 44 x 18 eccentric head had the best coverage of the proximal humerus with good soft tissue balance and allowed 50% translation of the humeral head across the glenoid.  At this point, the trial was removed.  The [facility] taper was meticulously cleaned and dried.  The final head was impacted into position and a final reduction was performed.  We gained some good soft tissue balance to position the implants.  The joint was copiously irrigated and hemostasis was obtained.  I then repaired the subscapularis back to the lesser tuberosity to the cuff of soft tissue using #2 fiber wires.  I then repaired the rotator interval at the apex with a pair figure-of-eight #2 FiberWire sutures.  Excellent soft tissue closure was achieved, much to our satisfaction.  At this point, the arm easily achieved 30 degrees of external rotation without excessive stress on the repair.  The wound was then irrigated.  Hemostasis was obtained. The deltopectoral interval was then reapproximated with series of figure- of-eight #1 Vicryl sutures.  A 2-0 Vicryl used for the subcu layer and intracuticular 3-0 Monocryl for  the skin followed by Steri-Strips.  Dry dressing was applied on right arm.  Right arm was placed in a sling. The patient was awakened, extubated, and taken to recovery room in stable condition.  Jenetta Loges, PA-C was used as Environmental consultant throughout this case essential for help with positioning of the patient, retraction of soft tissues, tissue manipulation, wound closure, and intraoperative decision making.     Metta Clines. Emojean Gertz, M.D.     KMS/MEDQ  D:  08/17/2013  T:  08/18/2013  Job:  [id number]

## 2013-08-18 NOTE — Progress Notes (Signed)
Occupational Therapy Evaluation Patient Details Name: [name] [name] MRN: [id number] DOB: 07-Jun-1961 Today's Date: 08/18/2013 Time: 2992-4268 OT Time Calculation (min): 47 min  OT Assessment / Plan / Recommendation History of present illness 53 yo R TSA   Clinical Impression   Pt making excellent progress. Began ex per protocol. Educating on compensatory techniques for ADL. Will return to complete education with wife.     OT Assessment  Patient needs continued OT Services    Follow Up Recommendations  Outpatient OT;Supervision - Intermittent    Barriers to Discharge      Equipment Recommendations  None recommended by OT    Recommendations for Other Services    Frequency  Min 2X/week    Precautions / Restrictions Precautions Precautions: Shoulder Type of Shoulder Precautions: supple protocol Shoulder Interventions: Shoulder sling/immobilizer Precaution Booklet Issued: Yes (comment) Required Braces or Orthoses: Sling Restrictions Weight Bearing Restrictions: Yes RUE Weight Bearing: Non weight bearing   Pertinent Vitals/Pain 4/10. Shoulder. Requested pain meds    ADL  Grooming: Moderate assistance Where Assessed - Grooming: Supported sitting Upper Body Bathing: Moderate assistance Where Assessed - Upper Body Bathing: Unsupported sitting Lower Body Bathing: Minimal assistance Where Assessed - Lower Body Bathing: Supported sit to stand Upper Body Dressing: Moderate assistance Where Assessed - Upper Body Dressing: Supported sitting Lower Body Dressing: Minimal assistance Where Assessed - Lower Body Dressing: Supported sit to Lobbyist: Modified independent Armed forces technical officer Method: Sit to Loss adjuster, chartered: Comfort height toilet Toileting - Clothing Manipulation and Hygiene: Minimal assistance Transfers/Ambulation Related to ADLs: INDEPENDENT ADL Comments: bEGAN EDUCATION ON COMPENSATORY TECHNIQUES FOR adl and sling management    OT  Diagnosis: Generalized weakness;Acute pain  OT Problem List: Decreased strength;Decreased range of motion;Decreased knowledge of precautions;Impaired UE functional use;Pain;Increased edema OT Treatment Interventions: Self-care/ADL training;Therapeutic exercise;Therapeutic activities;Patient/family education   OT Goals(Current goals can be found in the care plan section) Acute Rehab OT Goals Patient Stated Goal: go home OT Goal Formulation: With patient Time For Goal Achievement: 09/01/13 Potential to Achieve Goals: Good  Visit Information  Last OT Received On: 08/18/13 Assistance Needed: +1 History of Present Illness: 53 yo R TSA       Prior [place] expects to be discharged to:: Private residence Living Arrangements: Spouse/significant other;Children Available Help at Discharge: Family;Available 24 hours/day Type of Home: House Home Access: Stairs to enter [employer] of Steps: 3 Home Layout: One level Home Equipment: None Prior Function Level of Independence: Independent Communication Communication: No difficulties Dominant Hand: Right         Vision/Perception Vision - History Baseline Vision: Wears glasses all the time   Cognition  Cognition Arousal/Alertness: Awake/alert Behavior During Therapy: WFL for tasks assessed/performed Overall Cognitive Status: Within Functional Limits for tasks assessed    Extremity/Trunk Assessment Upper Extremity Assessment Upper Extremity Assessment: RUE deficits/detail RUE Deficits / Details: TSA RUE Coordination: decreased fine motor;decreased gross motor Lower Extremity Assessment Lower Extremity Assessment: Overall WFL for tasks assessed Cervical / Trunk Assessment Cervical / Trunk Assessment: Normal     Mobility       Exercise Shoulder Exercises Pendulum Exercise: 10 reps;Standing;PROM;Right Shoulder Flexion: PROM;Right;5 reps;Supine (to @ 50 FF) Shoulder ABduction: 5  reps;PROM;Other (comment) (to 30) Shoulder External Rotation: PROM;Seated (to 30) Elbow Flexion: AAROM;Right;10 reps Elbow Extension: AROM;Right;10 reps Wrist Flexion: AROM;5 reps;Right Wrist Extension: AROM;Right;10 reps Donning/doffing shirt without moving shoulder: Moderate assistance Method for sponge bathing under operated UE: Moderate assistance Donning/doffing sling/immobilizer: Moderate assistance  Correct positioning of sling/immobilizer: Moderate assistance Pendulum exercises (written home exercise program): Supervision/safety ROM for elbow, wrist and digits of operated UE: Supervision/safety Sling wearing schedule (on at all times/off for ADL's): Supervision/safety Proper positioning of operated UE when showering: Supervision/safety Positioning of UE while sleeping: Supervision/safety   Balance     End of Session OT - End of Session Activity Tolerance: Patient tolerated treatment well Patient left: in chair;with call bell/phone within reach Nurse Communication: Mobility status;Patient requests pain meds  GO     Mathan Darroch,HILLARY 08/18/2013, 11:41 AM [facility], OTR/L  [phone] 08/18/2013

## 2013-08-18 NOTE — Plan of Care (Signed)
Problem: Consults Goal: Diagnosis- Total Joint Replacement Outcome: Completed/Met Date Met:  08/18/13 Hemiarthroplasty

## 2013-08-18 NOTE — Progress Notes (Signed)
Moderate drainage noted at pts incision site; dressing leaking.  Dressing changed to abd pad and gauze with assistance from charge RN.  On call Wyatt Portela, PA contacted. TORB to reinforce new dressing as needed.  Will continue to monitor patient; VSS.

## 2013-08-18 NOTE — Progress Notes (Signed)
Utilization review completed. Jimia Gentles, RN, BSN. 

## 2013-08-18 NOTE — Progress Notes (Signed)
Occupational Therapy Treatment Patient Details Name: [name] [name] MRN: [id number] DOB: [birth year] Today's Date: 08/18/2013 Time: 2637-8588 OT Time Calculation (min): 25 min  OT Assessment / Plan / Recommendation  History of present illness 53 yo R TSA   OT comments  Excellent progress and carry over. Pt with @ 60 FF. 30ER, 30 Abd all PROM. Verbalizes/demonstrates understanding of shoulder protocol. Pt to follow up with Dr. Onnie [name] and progress rehab R shoulder on outpt basis if needed.  Follow Up Recommendations  Outpatient OT;Supervision - Intermittent    Barriers to Discharge       Equipment Recommendations  None recommended by OT    Recommendations for Other Services    Frequency Min 2X/week   Progress towards OT Goals Progress towards OT goals: Goals met/education completed, patient discharged from [place] Discharge plan remains appropriate    Precautions / Restrictions Precautions Precautions: Shoulder Type of Shoulder Precautions: supple protocol Shoulder Interventions: Shoulder sling/immobilizer Precaution Booklet Issued: Yes (comment) Required Braces or Orthoses: Sling Restrictions Weight Bearing Restrictions: Yes RUE Weight Bearing: Non weight bearing   Pertinent Vitals/Pain 3/10 shoulder Ice to shoulder    ADL  Grooming: Moderate assistance Where Assessed - Grooming: Supported sitting Upper Body Bathing: Moderate assistance Where Assessed - Upper Body Bathing: Unsupported sitting Lower Body Bathing: Minimal assistance Where Assessed - Lower Body Bathing: Supported sit to stand Upper Body Dressing: Moderate assistance Where Assessed - Upper Body Dressing: Supported sitting Lower Body Dressing: Minimal assistance Where Assessed - Lower Body Dressing: Supported sit to Lobbyist: Modified independent Armed forces technical officer Method: Sit to Loss adjuster, chartered: Comfort height toilet Toileting - Clothing Manipulation and Hygiene: Minimal  assistance Transfers/Ambulation Related to ADLs: INDEPENDENT ADL Comments: Completed education with pt/wife. completing exercises and assisted with dressing/ADL approriately per protocol. Written information given    OT Diagnosis: Generalized weakness;Acute pain  OT Problem List: Decreased strength;Decreased range of motion;Decreased knowledge of precautions;Impaired UE functional use;Pain;Increased edema OT Treatment Interventions: Self-care/ADL training;Therapeutic exercise;Therapeutic activities;Patient/family education   OT Goals(current goals can now be found in the care plan section) Acute Rehab OT Goals Patient Stated Goal: go home OT Goal Formulation: With patient Time For Goal Achievement: 09/01/13 Potential to Achieve Goals: Good ADL Goals Pt Will Perform Upper Body Bathing: with supervision;with caregiver independent in assisting Pt Will Perform Upper Body Dressing: with supervision;with caregiver independent in assisting Pt/caregiver will Perform Home Exercise Program: Independently;Right Upper extremity Additional ADL Goal #1: Pt/family independent with shoulder protocol  Visit Information  Last OT Received On: 08/18/13 Assistance Needed: +1 History of Present Illness: 53 yo R TSA    Subjective Data      Prior [place] expects to be discharged to:: Private residence Living Arrangements: Spouse/significant other;Children Available Help at Discharge: Family;Available 24 hours/day Type of Home: House Home Access: Stairs to enter [employer] of Steps: 3 Home Layout: One level Home Equipment: None Prior Function Level of Independence: Independent Communication Communication: No difficulties Dominant Hand: Right    Cognition  Cognition Arousal/Alertness: Awake/alert Behavior During Therapy: WFL for tasks assessed/performed Overall Cognitive Status: Within Functional Limits for tasks assessed    Mobility        Exercises  Shoulder Exercises Pendulum Exercise: 10 reps;Standing;PROM;Right Shoulder Flexion: PROM;Right;5 reps;Supine (to @ 50 FF) Shoulder ABduction: 5 reps;PROM;Other (comment) (to 30) Shoulder External Rotation: PROM;Seated (to 30) Elbow Flexion: AAROM;Right;10 reps Elbow Extension: AROM;Right;10 reps Wrist Flexion: AROM;5 reps;Right Wrist Extension: AROM;Right;10 reps Donning/doffing shirt without moving shoulder: Supervision/safety;Patient  able to independently direct caregiver Method for sponge bathing under operated UE: Patient able to independently direct caregiver;Modified independent Donning/doffing sling/immobilizer: Patient able to independently direct caregiver;Modified independent Correct positioning of sling/immobilizer: Modified independent;Patient able to independently direct caregiver Pendulum exercises (written home exercise program): Modified independent ROM for elbow, wrist and digits of operated UE: Modified independent Sling wearing schedule (on at all times/off for ADL's): Modified independent Proper positioning of operated UE when showering: Modified independent Dressing change: Independent Positioning of UE while sleeping: Independent   Balance    End of Session OT - End of Session Activity Tolerance: Patient tolerated treatment well Patient left: in chair;with call bell/phone within reach Nurse Communication: Mobility status;Other (comment) (ready for D/C)  GO     [name],HILLARY 08/18/2013, 11:48 AM Maurie Boettcher, OTR/L  [phone] 08/18/2013

## 2013-08-18 NOTE — Progress Notes (Signed)
Pt D/Cd per MD order. Pt alert and oriented at discharge with no complaints of pain or SOB. IV d/c'd per MD order. Discharge teaching done. Pt and wife verbalized understanding of discharge teaching. Pt taken down to private vehicle VIA wheelchair by volunteer.

## 2013-08-28 ENCOUNTER — Other Ambulatory Visit: Payer: Self-pay | Admitting: Internal Medicine

## 2013-12-01 ENCOUNTER — Other Ambulatory Visit: Payer: Self-pay | Admitting: Internal Medicine

## 2014-02-21 ENCOUNTER — Telehealth: Payer: Self-pay | Admitting: Internal Medicine

## 2014-02-21 NOTE — Telephone Encounter (Signed)
Try this again

## 2014-02-21 NOTE — Telephone Encounter (Signed)
Sorry but I am too full  

## 2014-02-21 NOTE — Telephone Encounter (Signed)
Pt would like to switch to dr fry. Can I  sch?

## 2014-02-22 NOTE — Telephone Encounter (Signed)
Pt wife is aware

## 2014-02-23 ENCOUNTER — Other Ambulatory Visit: Payer: Self-pay | Admitting: Internal Medicine

## 2014-08-13 ENCOUNTER — Ambulatory Visit (INDEPENDENT_AMBULATORY_CARE_PROVIDER_SITE_OTHER): Payer: Managed Care, Other (non HMO) | Admitting: Family Medicine

## 2014-08-13 ENCOUNTER — Encounter: Payer: Self-pay | Admitting: Family Medicine

## 2014-08-13 VITALS — BP 136/82 | Temp 98.0°F | Wt 186.0 lb

## 2014-08-13 DIAGNOSIS — M1A09X0 Idiopathic chronic gout, multiple sites, without tophus (tophi): Secondary | ICD-10-CM

## 2014-08-13 DIAGNOSIS — E785 Hyperlipidemia, unspecified: Secondary | ICD-10-CM

## 2014-08-13 DIAGNOSIS — Z720 Tobacco use: Secondary | ICD-10-CM | POA: Insufficient documentation

## 2014-08-13 DIAGNOSIS — I10 Essential (primary) hypertension: Secondary | ICD-10-CM

## 2014-08-13 DIAGNOSIS — J309 Allergic rhinitis, unspecified: Secondary | ICD-10-CM | POA: Insufficient documentation

## 2014-08-13 DIAGNOSIS — Z1211 Encounter for screening for malignant neoplasm of colon: Secondary | ICD-10-CM

## 2014-08-13 NOTE — Assessment & Plan Note (Signed)
controlled on amlodipine 5mg . No change.

## 2014-08-13 NOTE — Assessment & Plan Note (Signed)
Triglycerides reduced from >500 to 227 with atorvastatin 20mg . LDL well controlled. Continue and recheck labs before CPE

## 2014-08-13 NOTE — Progress Notes (Signed)
[name] Reddish, MD Phone: [phone]  Subjective:  Patient presents today to establish care with me as their new primary care provider. Patient was formerly a patient of Dr. Leanne Chang. Chief complaint-noted.   Hyperlipidemia-reasonable control on atorvastatin 20mg   Regular exercise: advised ROS- no chest pain or shortness of breath. No myalgias  Hypertension-controlled on amlodipine 5mg   BP Readings from Last 3 Encounters:  08/13/14 136/82  08/18/13 135/90  05/05/13 128/82  Home BP monitoring-no Compliant with medications-yes without side effects ROS-Denies any CP, HA, SOB, blurry vision, LE edema.   Gout-controlled >1 year gout attack free. Has been on preventative medicine in the past. Formerly on voltaren.  ROS- no hot/swollen joints  The following were reviewed and entered/updated in epic: Past Medical History  Diagnosis Date  . Hypertension   . Hyperlipidemia   . Gout   . GERD (gastroesophageal reflux disease)   . Raynaud's syndrome     Hx: of  . Arthritis     "right shoulder" (08/18/2013), replaced   Patient Active Problem List   Diagnosis Date Noted  . Tobacco abuse 08/13/2014    Priority: High  . Hyperlipidemia 05/26/2010    Priority: Medium  . Gout 05/26/2010    Priority: Medium  . Essential hypertension 05/26/2010    Priority: Medium  . Allergic rhinitis 08/13/2014    Priority: Low  . S/P shoulder replacement 08/17/2013    Priority: Low  . Acne rosacea 05/05/2013    Priority: Low   Past Surgical History  Procedure Laterality Date  . Biceps tendon repair Right 2003  . Total shoulder arthroplasty Right 08/17/2013    RIGHT TOTAL SHOULDER ARTHROPLASTY;  Surgeon: [name], MD;  Location: [facility];  Service: Orthopedics;  Laterality: Right;    Family History  Problem Relation Age of Onset  . Coronary artery disease Father     CABG 38, former smoker  . Diabetes Father   . Hypertension Father   . Hyperlipidemia Mother     trig  . Hypertension  Sister     HLD    Medications- reviewed and updated Current Outpatient Prescriptions  Medication Sig Dispense Refill  . amLODipine (NORVASC) 5 MG tablet TAKE 1 TABLET BY MOUTH EVERY DAY 90 tablet 1  . atorvastatin (LIPITOR) 20 MG tablet TAKE 1 TABLET BY MOUTH EVERY DAY 90 tablet 1  . fluticasone (FLONASE) 50 MCG/ACT nasal spray Place 1 spray into both nostrils daily.     Allergies-reviewed and updated Allergies  Allergen Reactions  . Lisinopril Cough    cough    History   Social History  . Marital Status: Married    Spouse Name: N/A    Number of Children: N/A  . Years of Education: N/A   Social History Main Topics  . Smoking status: Current Some Day Smoker -- 0.12 packs/day for 21 years    Types: Cigarettes  . Smokeless tobacco: Never Used  . Alcohol Use: 2.4 oz/week    4 Shots of liquor per week     Comment: 08/18/2013 "3-4 mixed drinks/weekend"  . Drug Use: No  . Sexual Activity: Yes   Other Topics Concern  . None   Social History Narrative   Married. Son and daughter (33 and 41 in 2016). No grandkids.       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 Oropharynx normal. Good dentition.  CV: RRR no murmurs rubs or gallops Lungs: CTAB no crackles, wheeze, rhonchi Abdomen: soft/nontender/nondistended/normal bowel sounds.  Ext: no edema Skin: warm, dry, no rash  Neuro: grossly normal, moves all extremities, PERRLA   Assessment/Plan:  Hyperlipidemia Triglycerides reduced from >500 to 227 with atorvastatin 20mg . LDL well controlled. Continue and recheck labs before CPE   Essential hypertension controlled on amlodipine 5mg . No change.    Gout Controlled  without allopurinol. I advised patient we should get a uric acid level before physical to assess baseline control. May have been improved with dietary changes with >1 year gout attack free   Tobacco abuse S: 1 pack per 2 weeks. Advised cessation. Not interested.  ROS-no shortness of breath or dyspnea on exertion P: advised cessation. Discussed how this could increase cardiac/cva/malignancy risk.    Return precautions advised. 6-9 month follow up for CPE  Screening colonoscopy Orders Placed This Encounter  Procedures  . Ambulatory referral to Gastroenterology    Referral Priority:  Routine    Referral Type:  Consultation    Referral Reason:  Specialty Services Required    Requested Specialty:  Gastroenterology    Number of Visits Requested:  1

## 2014-08-13 NOTE — Patient Instructions (Addendum)
Let's have you back for a physical before the end of the year.   Standard labs 1 week before.  Let lab know if you opt to do PSA I want them to do a uric acid as well with the gout history. Ask them to add testosterone including free and total given your history of low testosterone total.  Labs need to be between 8-10 am and no food or liquids other than water after midnight  Blood pressure and cholesterol look reasonable  Strongly encourage you to quit smoking completely.   Refer to GI for colonoscopy

## 2014-08-13 NOTE — Assessment & Plan Note (Signed)
Controlled without allopurinol. I advised patient we should get a uric acid level before physical to assess baseline control. May have been improved with dietary changes with >1 year gout attack free

## 2014-08-13 NOTE — Assessment & Plan Note (Signed)
S: 1 pack per 2 weeks. Advised cessation. Not interested.  ROS-no shortness of breath or dyspnea on exertion P: advised cessation. Discussed how this could increase cardiac/cva/malignancy risk.

## 2014-08-22 ENCOUNTER — Other Ambulatory Visit: Payer: Self-pay | Admitting: Internal Medicine

## 2014-11-15 ENCOUNTER — Other Ambulatory Visit: Payer: Self-pay | Admitting: Internal Medicine

## 2015-02-09 ENCOUNTER — Other Ambulatory Visit: Payer: Self-pay | Admitting: Family Medicine

## 2015-04-23 ENCOUNTER — Other Ambulatory Visit (INDEPENDENT_AMBULATORY_CARE_PROVIDER_SITE_OTHER): Payer: Managed Care, Other (non HMO)

## 2015-04-23 DIAGNOSIS — Z0000 Encounter for general adult medical examination without abnormal findings: Secondary | ICD-10-CM

## 2015-04-23 LAB — CBC WITH DIFFERENTIAL/PLATELET
Basophils Absolute: 0.1 10*3/uL (ref 0.0–0.1)
Basophils Relative: 0.6 % (ref 0.0–3.0)
EOS PCT: 4.4 % (ref 0.0–5.0)
Eosinophils Absolute: 0.4 10*3/uL (ref 0.0–0.7)
HCT: 47.5 % (ref 39.0–52.0)
Hemoglobin: 15.6 g/dL (ref 13.0–17.0)
Lymphocytes Relative: 26.4 % (ref 12.0–46.0)
Lymphs Abs: 2.3 10*3/uL (ref 0.7–4.0)
MCHC: 32.8 g/dL (ref 30.0–36.0)
MCV: 87.5 fl (ref 78.0–100.0)
MONOS PCT: 6.7 % (ref 3.0–12.0)
Monocytes Absolute: 0.6 10*3/uL (ref 0.1–1.0)
NEUTROS ABS: 5.3 10*3/uL (ref 1.4–7.7)
NEUTROS PCT: 61.9 % (ref 43.0–77.0)
PLATELETS: 246 10*3/uL (ref 150.0–400.0)
RBC: 5.44 Mil/uL (ref 4.22–5.81)
RDW: 14 % (ref 11.5–15.5)
WBC: 8.6 10*3/uL (ref 4.0–10.5)

## 2015-04-23 LAB — LIPID PANEL
CHOL/HDL RATIO: 3
CHOLESTEROL: 174 mg/dL (ref 0–200)
HDL: 52.2 mg/dL (ref >39.00)
LDL CALC: 85 mg/dL (ref 0–99)
NonHDL: 121.84
TRIGLYCERIDES: 182 mg/dL — AB (ref 0.0–149.0)
VLDL: 36.4 mg/dL (ref 0.0–40.0)

## 2015-04-23 LAB — POCT URINALYSIS DIPSTICK
BILIRUBIN UA: NEGATIVE
Glucose, UA: NEGATIVE
Ketones, UA: NEGATIVE
Nitrite, UA: NEGATIVE
Protein, UA: NEGATIVE
SPEC GRAV UA: 1.025
Urobilinogen, UA: 0.2
pH, UA: 5.5

## 2015-04-23 LAB — TSH: TSH: 1.98 u[IU]/mL (ref 0.35–4.50)

## 2015-04-23 LAB — URINALYSIS, MICROSCOPIC ONLY

## 2015-04-23 LAB — HEPATIC FUNCTION PANEL
ALT: 34 U/L (ref 0–53)
AST: 29 U/L (ref 0–37)
Albumin: 4.3 g/dL (ref 3.5–5.2)
Alkaline Phosphatase: 79 U/L (ref 39–117)
BILIRUBIN DIRECT: 0.1 mg/dL (ref 0.0–0.3)
BILIRUBIN TOTAL: 0.5 mg/dL (ref 0.2–1.2)
Total Protein: 6.8 g/dL (ref 6.0–8.3)

## 2015-04-23 LAB — BASIC METABOLIC PANEL
BUN: 14 mg/dL (ref 6–23)
CO2: 29 meq/L (ref 19–32)
Calcium: 9.9 mg/dL (ref 8.4–10.5)
Chloride: 105 mEq/L (ref 96–112)
Creatinine, Ser: 1.08 mg/dL (ref 0.40–1.50)
GFR: 76.1 mL/min (ref >60.00)
GLUCOSE: 101 mg/dL — AB (ref 70–99)
POTASSIUM: 4.6 meq/L (ref 3.5–5.1)
SODIUM: 141 meq/L (ref 135–145)

## 2015-04-23 LAB — PSA: PSA: 0.14 ng/mL (ref 0.10–4.00)

## 2015-05-02 ENCOUNTER — Encounter: Payer: Self-pay | Admitting: Family Medicine

## 2015-05-02 ENCOUNTER — Ambulatory Visit (INDEPENDENT_AMBULATORY_CARE_PROVIDER_SITE_OTHER): Payer: Managed Care, Other (non HMO) | Admitting: Family Medicine

## 2015-05-02 VITALS — BP 128/84 | HR 83 | Temp 98.4°F | Ht 69.0 in | Wt 181.0 lb

## 2015-05-02 DIAGNOSIS — Z0000 Encounter for general adult medical examination without abnormal findings: Secondary | ICD-10-CM

## 2015-05-02 DIAGNOSIS — R5383 Other fatigue: Secondary | ICD-10-CM | POA: Diagnosis not present

## 2015-05-02 DIAGNOSIS — N529 Male erectile dysfunction, unspecified: Secondary | ICD-10-CM

## 2015-05-02 DIAGNOSIS — Z1211 Encounter for screening for malignant neoplasm of colon: Secondary | ICD-10-CM

## 2015-05-02 DIAGNOSIS — R319 Hematuria, unspecified: Secondary | ICD-10-CM

## 2015-05-02 LAB — URINALYSIS, MICROSCOPIC ONLY

## 2015-05-02 NOTE — Patient Instructions (Addendum)
[facility] will call you to schedule colonoscopy.  Trial 2-4 of the cialis before sex. Can use up to every 72 hours. We can call this in if it works well of ryou  Urine and urine culture before you leave. With smoking history, low threshold to refer you to urology for evaluation  Glad you will be starting your exercise back soon  Always advise you to quit smoking  Remind me to check testosterone at follow up  6 months follow up

## 2015-05-02 NOTE — Progress Notes (Signed)
[name] Reddish, MD Phone: [phone]  Subjective:  Patient presents today for their annual physical. Chief complaint-noted.   -overall doing well but dealing with some fatigue, erectile issues. I have concern about hsi urine findings ROS- full  review of systems was completed and negative except for as noted in HPI  The following were reviewed and entered/updated in epic: Past Medical History  Diagnosis Date  . Hypertension   . Hyperlipidemia   . Gout   . GERD (gastroesophageal reflux disease)   . Raynaud's syndrome     Hx: of  . Arthritis     "right shoulder" (08/18/2013), replaced   Patient Active Problem List   Diagnosis Date Noted  . Tobacco abuse 08/13/2014    Priority: High  . Hyperlipidemia 05/26/2010    Priority: Medium  . Gout 05/26/2010    Priority: Medium  . Essential hypertension 05/26/2010    Priority: Medium  . Allergic rhinitis 08/13/2014    Priority: Low  . S/P shoulder replacement 08/17/2013    Priority: Low  . Acne rosacea 05/05/2013    Priority: Low   Past Surgical History  Procedure Laterality Date  . Biceps tendon repair Right 2003  . Total shoulder arthroplasty Right 08/17/2013    RIGHT TOTAL SHOULDER ARTHROPLASTY;  Surgeon: [name], MD;  Location: [facility];  Service: Orthopedics;  Laterality: Right;    Family History  Problem Relation Age of Onset  . Coronary artery disease Father     CABG 46, former smoker  . Diabetes Father   . Hypertension Father   . Hyperlipidemia Mother     trig  . Hypertension Sister     HLD    Medications- reviewed and updated Current Outpatient Prescriptions  Medication Sig Dispense Refill  . amLODipine (NORVASC) 5 MG tablet TAKE 1 TABLET BY MOUTH EVERY DAY 90 tablet 1  . atorvastatin (LIPITOR) 20 MG tablet TAKE 1 TABLET BY MOUTH EVERY DAY 90 tablet 1  . fluticasone (FLONASE) 50 MCG/ACT nasal spray Place 1 spray into both nostrils daily.     No current facility-administered medications for this  visit.    Allergies-reviewed and updated Allergies  Allergen Reactions  . Lisinopril Cough    cough    Social History   Social History  . Marital Status: Married    Spouse Name: N/A  . Number of Children: N/A  . Years of Education: N/A   Social History Main Topics  . Smoking status: Current Some Day Smoker -- 0.12 packs/day for 21 years    Types: Cigarettes  . Smokeless tobacco: Never Used  . Alcohol Use: 2.4 oz/week    4 Shots of liquor per week     Comment: 08/18/2013 "3-4 mixed drinks/weekend"  . Drug Use: No  . Sexual Activity: Yes   Other Topics Concern  . None   Social History Narrative   Married. Son and daughter (52 and 71 in 2016). No grandkids.       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 Oropharynx normal Neck: no thyromegaly CV: RRR no murmurs rubs or gallops  Lungs: CTAB no crackles, wheeze, rhonchi Abdomen: soft/nontender/nondistended/normal bowel sounds. No rebound or guarding.  Rectal: normal tone, normal size prostate, no masses or tenderness Ext: no edema Skin: warm, dry Neuro: grossly normal, moves all extremities, PERRLA  Assessment/Plan:  54 y.o. male presenting for annual physical.  Health Maintenance counseling: 1. Anticipatory guidance: Patient counseled regarding regular dental exams, eye doctor, wearing seatbelts, wearing sunscreen 2. Risk factor reduction:  Advised patient of need for regular exercise and diet rich and fruits and vegetables to reduce risk of heart attack and stroke. Had been doing well with exercise then job requirements reduced ability. Plans to restart  soon after getting back from vacation 3. Immunizations/screenings/ancillary studies- declining flu Health Maintenance Due  Topic Date Due  . [facility] [birth year] [facility][birth year] [facility]  . HIV Screening - [facility] 08/02/1977  . COLONOSCOPY - referred 08/02/2012  4. Prostate cancer screening- low risk exam and PSA 5. Colon cancer screening - referred again today 6. Skin cancer screening- seen spring 2016, plans spring 2017   Tobacco abuse- 1 pack per 2 weeks or less. Advised complete cessation. Not ready to quit  Hyperlipidemia- great control on atorvastatin 20mg  Lab Results  Component Value Date   CHOL 174 04/23/2015   HDL 52.20 04/23/2015   LDLCALC 85 04/23/2015   LDLDIRECT 64.8 09/16/2012   TRIG 182.0* 04/23/2015   CHOLHDL 3 04/23/2015   Gout- no flares  Hypertension- controlled on amlodipine 5mg  BP Readings from Last 3 Encounters:  05/02/15 128/84  08/13/14 136/82  08/18/13 135/90  [facility]  Fatigue and ED S:concerned about erections and fatigue level. Doesn't have energy he used to have. At least a year of symptoms A/P: requests testosterone check, we will have him return for fasting testosterone levels Also given cialis to trial 5mg  tabs. Can take 2-4 before sex up to every 72 hours.   Hematuria and other urine abnormalities S: long term smoker.  WBC, UA 0-2/hpf  11-20/hpf (A)   RBC / HPF 0-2/hpf  3-6/hpf (A)   Mucus, UA None  Presence of (A)   Squamous Epithelial / LPF Rare(0-4/hpf)  Rare(0-4/hpf)   Comments: transitional epith present   Renal Epithel, UA None  Rare(0-4/hpf) (A)   Bacteria, UA None  Few(10-50/hpf) (A)   Granular Casts, UA None  Presence of (A)       UA for physical   Ref Range 1d ago  10d ago      WBC, UA 0-2/hpf  0-2/hpf 11-20/hpf (A)    RBC / HPF 0-2/hpf  11-20/hpf (A) 3-6/hpf (A)    Mucus, UA None  Presence of (A) Presence of (A)    Bacteria, UA None   Rare(<10/hpf) (A) Few(10-50/hpf) (A)    Hyaline Casts, UA None  Presence of (A)        Repeat UA today   A/P: given long term smoker and hematuria, refer to urology if urine culture negative   We discussed with urine findings and concern about testosterone, this would be 99213 plus CPE   6 months advised unless need to be seen sooner based on labs  Orders Placed This Encounter  Procedures  . Urine culture    [facility]  . Urine Microscopic Only  . Testosterone, Free, Total, SHBG    Standing Status: Future     Number of Occurrences:      Standing Expiration Date: 06/01/2016  . Ambulatory referral to  Gastroenterology    Referral Priority:  Routine    Referral Type:  Consultation    Referral Reason:  Specialty Services Required    Number of Visits Requested:  1

## 2015-05-04 LAB — URINE CULTURE
Colony Count: NO GROWTH
Organism ID, Bacteria: NO GROWTH

## 2015-05-06 NOTE — Addendum Note (Signed)
Addended by: Clyde Lundborg A on: 05/06/2015 08:30 AM   Modules accepted: Orders

## 2015-05-21 ENCOUNTER — Ambulatory Visit (AMBULATORY_SURGERY_CENTER): Payer: Self-pay

## 2015-05-21 VITALS — Ht 69.0 in | Wt 182.6 lb

## 2015-05-21 DIAGNOSIS — Z1211 Encounter for screening for malignant neoplasm of colon: Secondary | ICD-10-CM

## 2015-05-21 MED ORDER — NA SULFATE-K SULFATE-MG SULF 17.5-3.13-1.6 GM/177ML PO SOLN: ORAL | Status: DC

## 2015-05-21 NOTE — Progress Notes (Signed)
Per pt, no allergies to soy or egg products.Pt not taking any weight loss meds or using  O2 at home. 

## 2015-05-21 NOTE — Progress Notes (Deleted)
NOMBRE:    [name] [name] de nacimiento:  Nmero de rcord mdico: Fecha del procedimiento: *** Hora de llegada: *** Hora del procedimiento: ***    Ubicacin del procedimiento: {UBICACIN DEL PROCEDIMIENTO CHL [phone]  PREPARATION FOR COLONOSCOPY WITH SUPREP (PREPARACIN PARA COLONOSCOPIA CON SUPREP)  El da *** (5 das antes del procedimiento) deje de comer nueces/frutos secos, semillas, palomitas de maz, maz, frijoles, guisantes, ensaladas o cualquier verdura cruda.  No tome ningn suplemento de fibra (por ejemplo, Metamucil, Citrucel y Benefiber).   EL DA ANTERIOR AL PROCEDIMIENTO:   FECHA: ***   DA: {Hora; das de la semana:30300}  1. [place].   2. No tome nada de color rojo o morado.  Evite los jugos con pulpa.  No tome jugo de naranja, de toronja ni de [place].   3. Tome por lo menos 64 oz. (8 vasos) de lquidos claros durante el da para prevenir la deshidratacin y ayudar a que la preparacin funcione eficientemente.    Los lquidos claros incluyen: agua, hielo, t/caf (azcar est bien, pero sin leche ni crema), jugo (de [place], uva blanca, arndano blanco), caldo claro, consom, caldo, sopa colada de fideos con pollo, gelatina, paletas, bebidas con sabores a frutas en polvo, gatorade, limonada, bebidas gaseosas, caramelos dulces.     SIGA ESTAS INSTRUCCIONES DE PREPARACIN, NO LAS INSTRUCCIONES EN LA CAJA DE SUPREP.   A LAS 6:00pm: COMPLETE LOS PASOS DEL 1 AL 4 A CONTINUACIN:  Paso 1: Vierta UNA (1) botella de 6-onzas del lquido SUPREP en el recipiente de Optician, dispensing.   Paso 2: Agregue agua fresca de tomar hasta la lnea de 16-onzas en el recipiente y Optician, dispensing.    Paso 3: Tome TODA la preparacin diluida de 16-onzas en los prximos 30 minutos.  Paso 4: Usted DEBE [employer] (2) o ms recipientes adicionales de 16-onzas de agua durante la prxima hora (1).    Siga tomando lquidos claros [facility] hora de [place].   EL DA DEL  PROCEDIMIENTO:   FECHA: ***       DA: {Hora; das de la semana:30300}  SIGA CON LQUIDOS CLAROS, NO COMIDA SLIDA.  A partir de ***  (5 horas antes del procedimiento): COMPLETE LOS PASOS DEL 1 AL 4 A CONTINUACIN:  Paso 1: Vierta UNA (1) botella de 6-onzas del lquido SUPREP en el recipiente de Optician, dispensing.   Paso 2: Agregue agua fresca de tomar hasta la lnea de 16-onzas en el recipiente y Optician, dispensing.     Paso 3: Tome TODA la preparacin diluida de 16-onzas en los prximos 30 minutos.      Paso 4: Usted DEBE [employer] (2) o ms recipientes adicionales de 16-onzas de agua durante la prxima (1) hora.  Puede tomar lquidos claros hasta *** (3 horas antes del procedimiento).     INSTRUCCIONES DE MEDICAMENTOS  A menos que se le haya indicado lo contrario, usted debe tomar los medicamentos regulares recetados con un pequeo sorbo de agua tan pronto como sea posible la maana del procedimiento.    Tome los medicamentos autorizados a ms tardar a las ***  Pacientes diabticos - ver las instrucciones separadas.  Deje de tomar Plavix o Aggrenox Games developer *** (7 das antes del procedimiento)  Deje de tomar el Coumadin Games developer *** (5 das antes del procedimiento)  Instrucciones adicionales de medicamentos: Si usted utiliza cualquier tipo de Tax inspector por favor trigalos con usted a su procedimiento.      Llame a la oficina (  [id number]) si tiene fiebre 2 das antes del procedimiento.     A menos que se le haya indicado lo contrario, usted debe tomar los medicamentos regulares recetados con un pequeo sorbo de agua tan pronto como sea posible la maana del procedimiento.    Tome los medicamentos autorizados a ms tardar a las ***  Pacientes diabticos - ver las instrucciones separadas.  Deje de tomar Plavix o Aggrenox Games developer *** (7 das antes del procedimiento)  Deje de tomar el Coumadin Games developer *** (5 das antes del procedimiento)  Instrucciones adicionales de medicamentos: Si usted utiliza  cualquier tipo de Tax inspector por favor trigalos con usted a su procedimiento.      Llame a la oficina [phone]) si tiene fiebre 2 das antes del procedimiento.      El siguiente paquete contiene informacin en cuanto a sus instrucciones de preparacin, lo que puede Chartered certified accountant de su procedimiento, el cuidado de seguimiento, [place] poltica de Biomedical scientist, informacin financiera, sus derechos y responsabilidades como paciente, el proceso de Engineering geologist, las directivas anticipadas y Ardelia Mems lista de preguntas ms frecuentes.    COMPAERO DE CUIDADO   Usted necesitar un adulto responsable de al menos 18 aos de edad para que lo represente como su compaero de [place] de su procedimiento.  Esta persona necesita llegar con usted a esta instalacin, quedarse aqu durante su procedimiento y despus llevarlo a su casa ms tarde.  No podemos empezar su procedimiento a menos que su compaero de cuidado est presente en nuestras instalaciones.  El tiempo total desde que se registra hasta que le den de alta es aproximadamente de 2-3 horas.  Antes de que se vaya, su mdico repasar los resultados y Scientist, research (life sciences) con usted (y su compaero de cuidado, si  usted da el permiso).  QU USAR/TRAER   Use ropa suelta que sea fcil de quitarse.  [place] y otros objetos de Neurosurgeon.  Sin embargo, es posible que desee traer un libro para leer mientras espera a que su procedimiento empiece.  Sus pertenencias se le entregarn a su compaero de cuidado antes de que usted se vaya a la sala de procedimientos.  Qutese todas las joyas de agujeros (piercing) del cuerpo y djelas en casa.  Usted no debe usar [employer] de uas rojo o de color oscuro.  QU ESPERAR DESPUS DEL PROCEDIMIENTO   Alguna sensacin de hinchazn en el abdomen.  Ms expulsin de gases de lo normal.  El caminar puede ayudar a eliminar el aire que se puso en el tracto gastrointestinal durante el procedimiento y reducir la  hinchazn.  Usted puede notar manchas de sangre en las heces o en el papel [place] usted complet una preparacin intestinal para su procedimiento puede que no tenga una defecacin normal por unos [employer].  DIETA  En general, su primera comida despus del procedimiento debe ser una comida pequea despus de la cual est bien que vuelva a su dieta normal.  La mitad de un sndwich o un plato de sopa es un ejemplo de una buena primera comida.  Los alimentos pesados o fritos son ms difciles de Publishing copy y Chief Strategy Officer que se sienta con nuseas o hinchado.  Tome bastantes lquidos pero debe evitar las bebidas alcohlicas durante 24 horas.  Estas instrucciones de dieta pueden modificarse dependiendo de los [place] de su procedimiento.  ACTIVIDAD   Su compaero de cuidado debe llevarlo a casa directamente despus del procedimiento.  Debe planear  tomar todo con calma, moverse lentamente durante el resto del da.  Puede reanudar sus actividades normales el da siguiente del procedimiento, sin embargo, usted NO DEBE CONDUCIR ni Risk manager maquinaria pesada Doctor, general practice siguiente (debido a los medicamentos sedantes utilizados durante el procedimiento).    [place] hbil despus de su procedimiento, nuestro personal llamar al nmero de telfono de su casa o al celular anotado en su historial para ver cmo est y para contestar cualquier pregunta o inquietudes que pueda tener.  Si no hay respuesta en el nmero que nos dio y no hemos odo de usted a travs del mdico de urgencias de turno, asumiremos que usted ha regresado a sus actividades regulares diarias sin incidentes.     Si se toman biopsias durante el procedimiento, usted ser contactado por telfono o por carta en las prximas 2-3 semanas.  Por favor llame a su gastroenterlogo al [phone] si no ha sabido nada de las biopsias en 3 semanas.      POLTICA DE CANCELACIN    Necesitamos  tiempo suficiente para cuidar de todos nuestros pacientes y por lo tanto requerimos de un aviso con 2 das laborales completos para cancelaciones de procedimientos que no son de Freight forwarder.  Si no avisa con 2 das completos de anticipacin, esto Engineer, manufacturing systems en un cargo:  $100 por un solo  procedimiento (endoscopia superior o inferior) $200 por un procedimiento doble (endoscopia superior e inferior)  Si el da de su procedimiento es:  *** Es necesario que nos avise a las 5 pm del: Forensic psychologist Martes         Jueves Mircoles        Viernes Jueves         Lunes Viernes        Martes   SU RESPONSABILIDAD [place]  Si usted tiene seguro, tendr que ponerse en contacto con su compaa de seguro para verificar que usted tiene [place] y para Teacher, adult education la cantidad de cobertura que ellos le proveern.  Es importante decirles a ellos que usted va a Child psychotherapist procedimiento realizado en un Centro de [place] [facility] ([facility]).  Ellos le pueden decir qu parte del costo ser su responsabilidad, por lo general se expresa ya sea como una cantidad fija o como un porcentaje del costo total.  Nosotros tambin estaremos contactando su compaa de seguros con la informacin sobre su procedimiento con el fin de obtener una certificacin previa.  Usted debe ponerse en contacto con su compaa de seguros tambin, el no Immunologist en que usted tenga que pagar una gran parte del costo o incluso el costo total del procedimiento.  USTED PUEDE RECIBIR LAS SIGUIENTES FACTURAS  1. [place] ([place]) enviar una factura de la instalacin por el uso de la sala de procedimiento, medicamentos y suministros.  2. Su gastroenterlogo de [employer]o le enviar una factura de cargo profesional por Multimedia programmer procedimiento.  3. Si se realiza una biopsia, usted recibir [place] factura de TEFL teacher de [facility]) por  su cuota profesional y [place]) por el procesamiento de la muestra de patologa. 4.   Si recibe [place]rivan para la sedacin durante el procedimiento, usted recibir [place]  factura de los Especialistas en [place] ([facility]) por la administracin del  medicamento.   Las  quejas o preguntas con respecto a la facturacin, el pago por terceros pagadores o planes de Estée [employer]uder ser dirigidas al Nordstrom de Servicio al Cliente de Facturacin de Servicios por H. [employer] Profesionales Armed forces training and education officer of Chiropodist) del [facility], [address] [place], [place],  [address].  Las consultas telefnicas se pueden hacer en [place] llamando al [phone], de lunes a viernes de 8 am a 5 pm o puede visitar el sitio web de [facility] en: [email or web address]/.  Sherilyn Cooter clic en 'Para pacientes' ("For patients") y luego haga clic en 'Preguntas sobre su cuenta' ("Questions about your bill").  CENTRO DE ENDOSCOPIA DE [facility])                                 [place] ([place]) es un centro independiente de ciruga ambulatoria ([place]) ubicado en el cuarto [place] ([facility]) en [place], [place].  Cuenta con [place], certificado por Medicare y est acreditado por AAAHC.  Los mdicos de IT sales professional de Research officer, political party [facility] en [phone].  [place] se unieron [place] en 1999 y el LEC es ahora propiedad [place].  Hemos completado una renovacin importante en el 2006 y el LEC, ahora [place], ofrece una  mayor privacidad y comodidad para nuestros pacientes y sus familiares.  Hemos invertido en instalaciones y equipos de alta tecnologa para asegurar que nuestros pacientes  reciban la mejor y ms actualizada atencin.   En el LEC, los gastroenterlogos certificados por la asociacin mdica realizan procedimientos endoscpicos electivos teraputicos y de diagnstico tales como la endoscopia y la colonoscopia.  El horario de atencin es de 7:30 am a 6 p.m. de lunes a  viernes.  Despus de las horas de funcionamiento publicadas, se proporciona la atencin urgente o de emergencia en [facility] y en [place]. [place] tambin proporcionan cobertura de emergencia las 24 horas.  Despus de las horas regulares de atencin y los fines de [place], [place] mdico de turno puede ser contactado llamando al [phone].  El servicio de Publishing rights manager tomar su mensaje para que el mdico de turno se ponga en contacto con usted.   SUS [place]      . Cuidado y [place], respetuosa y [place]. . Discusin de su condicin o enfermedad, lo que podemos hacer al respecto y el resultado probable de la atencin. . Conocer los nombres y las funciones de las personas que cuidan de usted. Ardis Rowan aceptar o rechazar cualquier tratamiento dentro de la ley durante su estada. . El personal y los mdicos de [place] protegern su privacidad tanto como sea posible. . Sus rcords mdicos son confidenciales a menos que usted de permiso para que los divulguemos o que la ley requiera que sean divulgados.  Cuando LEC divulga sus registros a otros, como a su compaa de seguros, les pedimos mantener la confidencialidad.  Usted puede revisar sus rcords y Field seismologist preguntas sobre ellos a menos que est restringido por la ley. Usted puede contar con que el personal le dar la atencin mdica que usted necesite en lo mejor de sus capacidades.  Puede que se le recomiende un tratamiento, una remisin o una transferencia.  Si necesita una transferencia, a usted  se le informar de los [place], los beneficios y otras opciones.   Waldron Session tiene derecho a  saber si LEC tiene relaciones con terceros que puedan influir en su tratamiento o cuidado.  Estas podran ser con instalaciones educativas, compaas de seguros u otros proveedores de atencin mdica. Waldron Session puede aceptar o rechazar el participar en una investigacin.  Si no acepta, nosotros seguiremos proporcionndole el mejor de los cuidados. Waldron Session tiene el derecho a saber acerca de las reglas de [place] que lo afectan a usted,  su cuidado, los cargos y los mtodos de pago. Marland Kitchen Usted tiene el derecho a saber acerca de los recursos de LEC, tales como el proceso de [place], que pueden ayudarle a [employer]gate problemas y las preguntas acerca de su estada y [place].     SUS RESPONSABILIDADES COMO PACIENTE  . Usted es responsable de darnos informacin sobre su salud, tal como enfermedades del pasado, hospitalizaciones y Chief Strategy Officer. Waldron Session debe hacer preguntas cuando no entiende la informacin o las instrucciones. . Si no puede seguir adelante con su tratamiento, debe informarle a su mdico o enfermera. Waldron Session y sus visitantes son responsables de ser considerados con las necesidades de otros pacientes, del personal y Academic librarian. Marland Kitchen Usted es responsable de proporcionar informacin sobre su seguro de salud y colaborar con Development worker, community personal de facturacin para arreglar la forma de pago cuando sea necesario.  . Su salud depende de las decisiones que tome en su vida diaria.  Usted es responsable de Database administrator de su estilo de vida en su salud. . Se le pide que comparta sus [place], creencias y tradiciones que ayudan al personal a proporcionarle una atencin que respete sus valores y su dignidad.   PROCESO DE RECLAMOS/QUEJAS   El LEC reconoce que los pacientes tienen el derecho a expresar sus preocupaciones sin temor a la discriminacin o represalias y a que estas preocupaciones se revisen y se respondan a Air cabin crew.  El LEC busca proporcionar una pronta revisin y [place] resolucin temprana de las quejas  o reclamos de Engineer, production.   Usted puede expresar sus preocupaciones, quejas o problemas con relacin a la atencin que ha recibido o est recibiendo a Surveyor, minerals del personal en cualquier momento de su cuidado.  Se harn todos los esfuerzos para atender su preocupacin o queja mientras se encuentra todava en el centro de LEC.  Sin embargo, si desea expresar una preocupacin o queja despus de que haya salido del centro, puede comunicarse con el supervisor de enfermera al [phone] o con Oswald Hillock director administrativo de gastroenterologa al [phone].  Si no podemos atender Investment banker, operational, puede comunicarse con el Departamento de Salud y [place] ([place]), en la [place] de Admisin de Blum (Complaint Intake Unit) [address], [place], [place], [place] [address], [place]: [place] [id number]) o por correo electrnico a [email or web address].  Tambin puede comunicarse con [place] Defensor de Medicare (Office of the [place]) para presentar una queja 800 MEDICARE [phone]) o en https://harris.info/.   [place] respalda el derecho del paciente adulto para tomar decisiones respecto a la aceptacin o Education officer, community de los tratamientos mdicos y/o quirrgicos y Haematologist las Directivas anticipadas como opciones para promover la autonoma del paciente en relacin con las decisiones de [place].  Sin embargo, debido a Patent examiner de Theatre manager con un  cuidado constante en el centro de la atencin ambulatoria, si un paciente sufre un paro cardaco o respiratorio u otra situacin que amenaza su vida, ser necesario que usted y/o su apoderado de atencin mdica firme un formulario que incluya el consentimiento para la reanimacin y la transferencia a un nivel ms alto de  cuidado.  Por lo tanto, el LEC no aceptar las instrucciones anticipadas previamente firmadas ni los acuerdos verbales familiares para ningn paciente.  En caso de no estar de acuerdo con la poltica del centro en relacin a las Directivas anticipadas y de negarse a Psychologist, clinical formulario, su procedimiento no se podr Oncologist LEC.  Sin embargo, el procedimiento se Administrator en las instalaciones del hospital y lo puede Optometrist un gastroenterlogo afiliado con [employer] [facility].  Para las leyes estatales aplicables y los formularios de [place], [place] comunicarse con la Organizacin de Informacin de [place] (Caring Information Organization) al [phone] para ingls y al [phone] para otros idiomas o a travs del sitio web en [email or web address]-[email or web address]/.  Otras fuentes incluyen el Departamento de Salud y Servicios Humanos en la Divisin de Envejecimiento y Servicios para Adultos ([place] Department of Health and [employer] Division of Aging and Adult Services) llamando al [phone] o en el sitio web http://massey-[email or web address]/ o ww.carolinasendoflifecare.org [place] llamando al [phone] o en el sitio web www.nclifelinks.org o [email or web address]/ahcdr.   ? Por favor consulte las instrucciones de preparacin personalizada en relacin con la hora de inicio.  Comprendemos, sin embargo, que es posible que tenga que trabajar hasta despus de esa hora.  Si es as, Art gallery manager a tomar su preparacin tan pronto como llegue a casa.  Pero tenga en cuenta de que cuanto ms tarde empiece, ms tarde tendr que levantarse a ir al bao.  [place]? Todas las soluciones tienen un saborcillo salado.  Puede probar cualquiera o todas estas sugerencias para mejorar o superar este sabor: Marland Kitchen Mantenga un caramelo en la boca mientras que [name] la solucin. Jomarie Longs tragos de otra bebida (jugo,  Coca-Cola, etc.) despus de cada vaso. Marland Kitchen Chupe un helado o paleta, mientras que [name] la solucin. Lady Saucier goma de mascar con sabor, mientras que [name] la solucin. Y SI ME ENFERMO DURANTE LA PREPARACIN? Deje de tomar la solucin y espere de 30-45 minutos.  Deje que su sistema se calme.  Trate de tomar pequeos sorbos de Coca-[employer]a u otra bebida.  Comience de nuevo la solucin, utilizando algunas de las sugerencias anteriores si el sabor es el problema. [place]? Cada persona es diferente en cuanto a la cantidad de tiempo que tarda el purgante (laxante) para Chief of Staff.  Algunas personas comienzan a defecar en la primera hora, otros no, hasta la cuarta hora o ms tarde.  La actividad ayuda a estimular el intestino as que, si es posible, no se siente a Photographer a que el intestino acte - IT consultant. [place]? S.  Con el fin de examinarle lo mejor posible, es importante tomar toda la solucin en el tiempo establecido.  Algunas de las preparaciones son de "dosis fraccionada" y estn diseadas para funcionar mejor si se [name] la preparacin en dos sesiones.  Sus instrucciones personalizadas mencionadas anteriormente le explican en detalle todo completo.  En el caso de que ya haya probado todo lo sugerido y todava no pueda completar la preparacin, por favor llmenos al [phone].  Si es despus de horas regulares de atencin o en el fin de [place], el servicio de Music therapist al mdico de turno para que se comunique con usted.  QU TIPO DE SEDACIN SE UTILIZA EN EL LEC? Hay dos tipos de sedacin.  Su gastroenterlogo decidir el tipo que va a necesitar en base a sus problemas mdicos personales, as como sus propias preferencias. . La sedacin moderada se logra utilizando una combinacin de un narctico intravenoso (Fentanilo, Sublimaze) y un ansioltico intravenoso (Midazolam, Versed).  Oncologist a un estado de  conciencia deprimido que le permitir a usted estar muy relajado y cmodo durante el procedimiento, Armed forces training and education officer en el cual todava es capaz de responder a los estmulos si es necesario.  Usted no debe recordar el procedimiento y es probable que no recuerde las instrucciones al darle de alta o la conversacin con el mdico despus de que la prueba haya terminado.  Usted no puede conducir o Air cabin crew maquinaria pesada durante las 24 horas despus del procedimiento.  Usted no recibir una factura por separado por este tipo de sedacin.  De vez en cuando este tipo de sedacin es menos eficaz que la sedacin profunda para los pacientes que toman ciertos medicamentos crnicos (analgsicos, antidepresivos, medicamentos contra la ansiedad) o en pacientes con un historial de uso crnico de alcohol. Pecola Leisure sedacin profunda (cuidado con anestesia monitoreada) se consigue utilizando un anestsico intravenoso de accin corta (Diprivan, Propofol) que promueve la relajacin y el sueo.  Este medicamento es administrado por Hotel manager (enfermero anestesista) calificado y acreditado en el uso de Diprovan.  Usted no debe recordar el procedimiento en s, pero si debe ser capaz de recordar las instrucciones al darle de alta y la conversacin con su mdico despus del estudio.  No puede conducir ni manejar maquinaria pesada durante las 24 horas despus del procedimiento.  Usted recibir una factura por separado por este tipo de sedacin.  Este tipo de sedacin es generalmente ms fiable para los pacientes que toman ciertos medicamentos crnicos o con ciertas condiciones mdicas y por lo tanto puede ser la preferida. .  Si usted tiene alguna pregunta sobre el tipo de sedacin que se Risk manager para su procedimiento, su gastroenterlogo estar encantado de hablar con usted. POR QU MI COMPAERO DE CUIDADO TIENE QUE ESTAR EN EL LEC DURANTE MI PROCEDIMIENTO? Los procedimientos endoscpicos son generalmente seguros, pero debido al riesgo de posibles  complicaciones asociadas con el procedimiento y Architect, es nuestra poltica que alguien est presente durante el procedimiento y dicha persona actuar como su portavoz en caso de necesidad de una intervencin de Freight forwarder.  No podemos empezar el procedimiento a menos que su compaero de cuidado est presente en la sala de espera de nuestras instalaciones. POR QU NECESITO UN CONDUCTOR? Los medicamentos que se usan para su sedacin causan [employer] reflejos, afectan el pensamiento y el juicio y tienen algn [place], por lo tanto afectan su capacidad para conducir con seguridad.  A pesar de que se puede sentir bien, se le indica que se abstenga de Forensic psychologist, Air cabin crew cualquier tipo de [place], de tomar cualquier decisin crtica o firmar cualquier documento legal en las 24 horas despus de su procedimiento. QU DEBO LLEVAR CONMIGO? [place], bolsos y carteras en casa.  Use ropa suelta, fcil de quitar (por ejemplo, ni pantimedias ni  fajas).  Es posible que desee llevar un libro para leer o un reproductor iPod/MP3 para Camera operator espera a que su procedimiento  empiece. [place]?  S, usted puede usar su dentadura postiza.  Sin embargo, se le puede pedir que se la quite antes de su procedimiento.    PUEDO USAR MIS Ty Ty?  Nosotros le recomendamos que deje los lentes de contacto en casa y en su lugar use sus gafas.  Si [place] sus lentes de contacto, se le puede pedir que se los quite antes del procedimiento, as que por favor traiga un estuche para ellos y tambin un par de gafas para usar despus del procedimiento.    ES EL ESTUDIO SEGURO DURANTE MI PERODO MENSTRUAL?  S, su procedimiento todava se puede realizar.     EL DOCTOR HABLAR CONMIGO DESPUS DEL PROCEDIMIENTO? S, su mdico repasar con [employer], las instrucciones del cuidado de seguimiento y las recomendaciones de Clinical research associate.  Esto tambin se repasar con  su compaero de cuidado si usted nos ha dado un permiso explcito.  Es importante que su compaero de cuidado se d cuenta de que usted no puede recordar mucho despus del procedimiento debido a los efectos de la anestesia y ellos tendrn que repasar esta informacin con usted ms tarde.   CUNTO TIEMPO ESTAR ALL? Todo el proceso tarda de 2-3 horas.  La mayor parte de ese tiempo se pasa en registrarlo y en despertarlo de forma segura despus del procedimiento.  La colonoscopia en s por lo general dura de 20-30 minutos.  Obviamente, puede haber retrasos imprevistos y apreciaremos su paciencia si esto ocurre.   QU PASA SI EL CLIMA ES TERRIBLE?  Haremos todo lo posible para ponernos en contacto con usted si el LEC va a cerrar o si hay un retraso en la apertura debido al mal tiempo.  Si usted no oye de nosotros, entonces puede asumir  que el centro est abierto.  Si usted decide cancelar debido al mal tiempo, no se espera que pague la cuota de cancelacin descrita anteriormente.      FIRMAS/CONFIDENCIALIDAD Usted ha firmado los papeles que se incluirn en su registro mdico electrnico.  Esto confirma el hecho de que la informacin anterior sobre el resumen de su visita (After Visit Summary) ha sido repasada y la entiende.  La responsabilidad total de la confidencialidad de la copia impresa de este resumen de su visita recae en usted y/o su compaero de cuidado.

## 2015-05-27 ENCOUNTER — Ambulatory Visit (AMBULATORY_SURGERY_CENTER): Payer: Managed Care, Other (non HMO) | Admitting: Gastroenterology

## 2015-05-27 ENCOUNTER — Encounter: Payer: Self-pay | Admitting: Gastroenterology

## 2015-05-27 VITALS — BP 113/76 | HR 63 | Temp 96.4°F | Resp 17 | Ht 69.0 in | Wt 182.0 lb

## 2015-05-27 DIAGNOSIS — Z1211 Encounter for screening for malignant neoplasm of colon: Secondary | ICD-10-CM | POA: Diagnosis present

## 2015-05-27 DIAGNOSIS — D124 Benign neoplasm of descending colon: Secondary | ICD-10-CM | POA: Diagnosis not present

## 2015-05-27 HISTORY — PX: COLONOSCOPY: SHX174

## 2015-05-27 MED ORDER — SODIUM CHLORIDE 0.9 % IV SOLN: 500.0000 mL | INTRAVENOUS | Status: DC

## 2015-05-27 NOTE — Op Note (Signed)
[place]  [address] [place], [address]   COLONOSCOPY PROCEDURE REPORT  PATIENT: [name] [name]  MR#: [id number] BIRTHDATE: Oct 03, 1960 , 54  yrs. old GENDER: male ENDOSCOPIST: Milus Banister, MD REFERRED [id number] Kristian Covey, M.D. PROCEDURE DATE:  05/27/2015 PROCEDURE:   Colonoscopy, screening, Colonoscopy with snare polypectomy, and Colonoscopy with hot biopsy/bipolar First Screening Colonoscopy - Avg.  risk and is 50 yrs.  old or older Yes.  Prior Negative Screening - Now for repeat screening. N/A  History of Adenoma - Now for follow-up colonoscopy & has been > or = to 3 yrs.  N/A  Polyps removed today? Yes ASA CLASS:   Class II INDICATIONS:Screening for colonic neoplasia and Colorectal Neoplasm Risk Assessment for this procedure is average risk. MEDICATIONS: Monitored anesthesia care and Propofol 200 mg IV  DESCRIPTION OF PROCEDURE:   After the risks benefits and alternatives of the procedure were thoroughly explained, informed consent was obtained.  The digital rectal exam revealed no abnormalities of the rectum.   The LB [id number]  endoscope was introduced through the anus and advanced to the cecum, which was identified by both the appendix and ileocecal valve. No adverse events experienced.   The quality of the prep was excellent.  The instrument was then slowly withdrawn as the colon was fully examined. Estimated blood loss is zero unless otherwise noted in this procedure report.   COLON FINDINGS: A sessile polyp measuring 2 mm in size was found in the descending colon.  A polypectomy was performed with cold forceps.  The resection was complete, the polyp tissue was completely retrieved and sent to histology.   A sessile polyp measuring 4 mm in size was found in the descending colon.  A polypectomy was performed with a cold snare.  The resection was complete, the polyp tissue was completely retrieved and sent to histology.   The  examination was otherwise normal.  Retroflexed views revealed no abnormalities. The time to cecum = 2.1 Withdrawal time = 8.3   The scope was withdrawn and the procedure completed. COMPLICATIONS: There were no immediate complications.  ENDOSCOPIC IMPRESSION: 1.   Sessile polyp was found in the descending colon; polypectomy was performed with cold forceps 2.   Sessile polyp was found in the descending colon; polypectomy was performed with a cold snare 3.   The examination was otherwise normal  RECOMMENDATIONS: If the polyp(s) removed today are proven to be adenomatous (pre-cancerous) polyps, you will need a repeat colonoscopy in 5 years.  Otherwise you should continue to follow colorectal cancer screening guidelines for "routine risk" patients with colonoscopy in 10 years.  You will receive a letter within 1-2 weeks with the results of your biopsy as well as final recommendations.  Please call my office if you have not received a letter after 3 weeks.  eSigned:  Milus Banister, MD 05/27/2015 10:03 AM

## 2015-05-27 NOTE — Progress Notes (Signed)
Called to room to assist during endoscopic procedure.  Patient ID and intended procedure confirmed with present staff. Received instructions for my participation in the procedure from the performing physician.  

## 2015-05-27 NOTE — Progress Notes (Signed)
Report to PACU, RN, vss, BBS= Clear.  

## 2015-05-27 NOTE — Patient Instructions (Signed)
YOU HAD AN ENDOSCOPIC PROCEDURE TODAY AT THE  ENDOSCOPY CENTER:   Refer to the procedure report that was given to you for any specific questions about what was found during the examination.  If the procedure report does not answer your questions, please call your gastroenterologist to clarify.  If you requested that your care partner not be given the details of your procedure findings, then the procedure report has been included in a sealed envelope for you to review at your convenience later.  YOU SHOULD EXPECT: Some feelings of bloating in the abdomen. Passage of more gas than usual.  Walking can help get rid of the air that was put into your GI tract during the procedure and reduce the bloating. If you had a lower endoscopy (such as a colonoscopy or flexible sigmoidoscopy) you may notice spotting of blood in your stool or on the toilet paper. If you underwent a bowel prep for your procedure, you may not have a normal bowel movement for a few days.  Please Note:  You might notice some irritation and congestion in your nose or some drainage.  This is from the oxygen used during your procedure.  There is no need for concern and it should clear up in a day or so.  SYMPTOMS TO REPORT IMMEDIATELY:   Following lower endoscopy (colonoscopy or flexible sigmoidoscopy):  Excessive amounts of blood in the stool  Significant tenderness or worsening of abdominal pains  Swelling of the abdomen that is new, acute  Fever of 100F or higher   For urgent or emergent issues, a gastroenterologist can be reached at any hour by calling [phone].   DIET: Your first meal following the procedure should be a small meal and then it is ok to progress to your normal diet. Heavy or fried foods are harder to digest and may make you feel nauseous or bloated.  Likewise, meals heavy in dairy and vegetables can increase bloating.  Drink plenty of fluids but you should avoid alcoholic beverages for 24  hours.  ACTIVITY:  You should plan to take it easy for the rest of today and you should NOT DRIVE or use heavy machinery until tomorrow (because of the sedation medicines used during the test).    FOLLOW UP: Our staff will call the number listed on your records the next business day following your procedure to check on you and address any questions or concerns that you may have regarding the information given to you following your procedure. If we do not reach you, we will leave a message.  However, if you are feeling well and you are not experiencing any problems, there is no need to return our call.  We will assume that you have returned to your regular daily activities without incident.  If any biopsies were taken you will be contacted by phone or by letter within the next 1-3 weeks.  Please call us at [phone] if you have not heard about the biopsies in 3 weeks.    SIGNATURES/CONFIDENTIALITY: You and/or your care partner have signed paperwork which will be entered into your electronic medical record.  These signatures attest to the fact that that the information above on your After Visit Summary has been reviewed and is understood.  Full responsibility of the confidentiality of this discharge information lies with you and/or your care-partner.    Resume medications. Information given on polyps. 

## 2015-05-28 ENCOUNTER — Telehealth: Payer: Self-pay | Admitting: *Deleted

## 2015-05-28 NOTE — Telephone Encounter (Signed)
No answer, message left for the patient. 

## 2015-06-02 ENCOUNTER — Encounter: Payer: Self-pay | Admitting: Gastroenterology

## 2015-06-03 ENCOUNTER — Encounter: Payer: Self-pay | Admitting: Family Medicine

## 2015-06-03 DIAGNOSIS — Z86010 Personal history of colonic polyps: Secondary | ICD-10-CM | POA: Insufficient documentation

## 2015-06-03 DIAGNOSIS — Z860101 Personal history of adenomatous and serrated colon polyps: Secondary | ICD-10-CM | POA: Insufficient documentation

## 2015-08-19 ENCOUNTER — Other Ambulatory Visit: Payer: Self-pay | Admitting: Urology

## 2015-09-06 ENCOUNTER — Encounter (HOSPITAL_BASED_OUTPATIENT_CLINIC_OR_DEPARTMENT_OTHER): Payer: Self-pay | Admitting: *Deleted

## 2015-09-06 NOTE — Progress Notes (Signed)
NPO AFTER MN.  ARRIVE AT [id number].  NEEDS ISTAT 8 AND EKG.  WILL TAKE AM MEDS W/ SIPS OF WATER DOS.

## 2015-09-12 ENCOUNTER — Other Ambulatory Visit: Payer: Self-pay | Admitting: Family Medicine

## 2015-09-13 ENCOUNTER — Ambulatory Visit (HOSPITAL_BASED_OUTPATIENT_CLINIC_OR_DEPARTMENT_OTHER): Payer: Managed Care, Other (non HMO) | Admitting: Anesthesiology

## 2015-09-13 ENCOUNTER — Encounter (HOSPITAL_BASED_OUTPATIENT_CLINIC_OR_DEPARTMENT_OTHER)
Admission: RE | Disposition: A | Discharge status: Home / Self Care | Payer: Self-pay | Source: Ambulatory Visit | Attending: Urology

## 2015-09-13 ENCOUNTER — Encounter (HOSPITAL_BASED_OUTPATIENT_CLINIC_OR_DEPARTMENT_OTHER): Payer: Self-pay | Admitting: *Deleted

## 2015-09-13 ENCOUNTER — Ambulatory Visit (HOSPITAL_BASED_OUTPATIENT_CLINIC_OR_DEPARTMENT_OTHER)
Admission: RE | Admit: 2015-09-13 | Discharge: 2015-09-13 | Disposition: A | Discharge status: Home / Self Care | Payer: Managed Care, Other (non HMO) | Source: Ambulatory Visit | Attending: Urology | Admitting: Urology

## 2015-09-13 DIAGNOSIS — F17210 Nicotine dependence, cigarettes, uncomplicated: Secondary | ICD-10-CM | POA: Insufficient documentation

## 2015-09-13 DIAGNOSIS — Z96611 Presence of right artificial shoulder joint: Secondary | ICD-10-CM | POA: Insufficient documentation

## 2015-09-13 DIAGNOSIS — R3121 Asymptomatic microscopic hematuria: Secondary | ICD-10-CM | POA: Diagnosis present

## 2015-09-13 DIAGNOSIS — I10 Essential (primary) hypertension: Secondary | ICD-10-CM | POA: Diagnosis not present

## 2015-09-13 DIAGNOSIS — F17290 Nicotine dependence, other tobacco product, uncomplicated: Secondary | ICD-10-CM | POA: Insufficient documentation

## 2015-09-13 DIAGNOSIS — M1990 Unspecified osteoarthritis, unspecified site: Secondary | ICD-10-CM | POA: Insufficient documentation

## 2015-09-13 DIAGNOSIS — N200 Calculus of kidney: Secondary | ICD-10-CM | POA: Insufficient documentation

## 2015-09-13 HISTORY — DX: Presence of spectacles and contact lenses: Z97.3

## 2015-09-13 HISTORY — DX: Testicular hypofunction: E29.1

## 2015-09-13 HISTORY — DX: Male erectile dysfunction, unspecified: N52.9

## 2015-09-13 HISTORY — PX: CYSTOSCOPY/URETEROSCOPY/HOLMIUM LASER/STENT PLACEMENT: SHX6546

## 2015-09-13 HISTORY — DX: Personal history of colonic polyps: Z86.010

## 2015-09-13 HISTORY — DX: Other microscopic hematuria: R31.29

## 2015-09-13 HISTORY — DX: Personal history of adenomatous and serrated colon polyps: Z86.0101

## 2015-09-13 HISTORY — DX: Personal history of other diseases of the musculoskeletal system and connective tissue: Z87.39

## 2015-09-13 HISTORY — DX: Calculus of kidney: N20.0

## 2015-09-13 HISTORY — PX: HOLMIUM LASER APPLICATION: SHX5852

## 2015-09-13 LAB — POCT I-STAT, CHEM 8
BUN: 14 mg/dL (ref 6–20)
CALCIUM ION: 1.19 mmol/L (ref 1.12–1.23)
Chloride: 107 mmol/L (ref 101–111)
Creatinine, Ser: 0.9 mg/dL (ref 0.61–1.24)
Glucose, Bld: 105 mg/dL — ABNORMAL HIGH (ref 65–99)
HEMATOCRIT: 45 % (ref 39.0–52.0)
Hemoglobin: 15.3 g/dL (ref 13.0–17.0)
Potassium: 4.1 mmol/L (ref 3.5–5.1)
SODIUM: 142 mmol/L (ref 135–145)
TCO2: 23 mmol/L (ref 0–100)

## 2015-09-13 SURGERY — CYSTOSCOPY/URETEROSCOPY/HOLMIUM LASER/STENT PLACEMENT
Anesthesia: General | Site: Ureter | Laterality: Right

## 2015-09-13 MED ORDER — MEPERIDINE HCL 25 MG/ML IJ SOLN
6.2500 mg | INTRAMUSCULAR | Status: DC | PRN
  Filled 2015-09-13: qty 1

## 2015-09-13 MED ORDER — ONDANSETRON HCL 4 MG/2ML IJ SOLN
INTRAMUSCULAR | Status: DC | PRN
  Administered 2015-09-13: 4 mg via INTRAVENOUS

## 2015-09-13 MED ORDER — DEXAMETHASONE SODIUM PHOSPHATE 10 MG/ML IJ SOLN
INTRAMUSCULAR | Status: AC
  Filled 2015-09-13: qty 1

## 2015-09-13 MED ORDER — FENTANYL CITRATE (PF) 100 MCG/2ML IJ SOLN
25.0000 ug | INTRAMUSCULAR | Status: DC | PRN
  Filled 2015-09-13: qty 1

## 2015-09-13 MED ORDER — PROPOFOL 10 MG/ML IV BOLUS
INTRAVENOUS | Status: DC | PRN
  Administered 2015-09-13: 200 mg via INTRAVENOUS

## 2015-09-13 MED ORDER — PROMETHAZINE HCL 25 MG/ML IJ SOLN
6.2500 mg | INTRAMUSCULAR | Status: DC | PRN
  Filled 2015-09-13: qty 1

## 2015-09-13 MED ORDER — LIDOCAINE HCL (CARDIAC) 20 MG/ML IV SOLN
INTRAVENOUS | Status: AC
  Filled 2015-09-13: qty 5

## 2015-09-13 MED ORDER — GENTAMICIN SULFATE 40 MG/ML IJ SOLN
5.0000 mg/kg | INTRAVENOUS | Status: AC
  Administered 2015-09-13: 350 mg via INTRAVENOUS
  Filled 2015-09-13 (×2): qty 8.75

## 2015-09-13 MED ORDER — LACTATED RINGERS IV SOLN
INTRAVENOUS | Status: DC
  Administered 2015-09-13 (×2): via INTRAVENOUS
  Filled 2015-09-13: qty 1000

## 2015-09-13 MED ORDER — FENTANYL CITRATE (PF) 100 MCG/2ML IJ SOLN
INTRAMUSCULAR | Status: AC
  Filled 2015-09-13: qty 2

## 2015-09-13 MED ORDER — MIDAZOLAM HCL 2 MG/2ML IJ SOLN
INTRAMUSCULAR | Status: AC
  Filled 2015-09-13: qty 2

## 2015-09-13 MED ORDER — GENTAMICIN IN SALINE 1.6-0.9 MG/ML-% IV SOLN
80.0000 mg | INTRAVENOUS | Status: DC
  Filled 2015-09-13: qty 50

## 2015-09-13 MED ORDER — LIDOCAINE HCL (CARDIAC) 20 MG/ML IV SOLN
INTRAVENOUS | Status: DC | PRN
  Administered 2015-09-13: 100 mg via INTRAVENOUS

## 2015-09-13 MED ORDER — SENNOSIDES-DOCUSATE SODIUM 8.6-50 MG PO TABS: 1.0000 | ORAL_TABLET | Freq: Two times a day (BID) | ORAL | Status: DC

## 2015-09-13 MED ORDER — LACTATED RINGERS IV SOLN
INTRAVENOUS | Status: DC
  Filled 2015-09-13: qty 1000

## 2015-09-13 MED ORDER — DEXAMETHASONE SODIUM PHOSPHATE 10 MG/ML IJ SOLN
INTRAMUSCULAR | Status: DC | PRN
  Administered 2015-09-13: 10 mg via INTRAVENOUS

## 2015-09-13 MED ORDER — IOHEXOL 350 MG/ML SOLN
INTRAVENOUS | Status: DC | PRN
  Administered 2015-09-13: 7 mL

## 2015-09-13 MED ORDER — KETOROLAC TROMETHAMINE 30 MG/ML IJ SOLN
INTRAMUSCULAR | Status: DC | PRN
  Administered 2015-09-13: 30 mg via INTRAVENOUS

## 2015-09-13 MED ORDER — SODIUM CHLORIDE 0.9 % IR SOLN
Status: DC | PRN
  Administered 2015-09-13: 4000 mL

## 2015-09-13 MED ORDER — TAMSULOSIN HCL 0.4 MG PO CAPS: 0.4000 mg | ORAL_CAPSULE | Freq: Every day | ORAL | Status: DC

## 2015-09-13 MED ORDER — FENTANYL CITRATE (PF) 100 MCG/2ML IJ SOLN
INTRAMUSCULAR | Status: DC | PRN
  Administered 2015-09-13: 50 ug via INTRAVENOUS
  Administered 2015-09-13 (×6): 25 ug via INTRAVENOUS

## 2015-09-13 MED ORDER — ONDANSETRON HCL 4 MG/2ML IJ SOLN
INTRAMUSCULAR | Status: AC
  Filled 2015-09-13: qty 2

## 2015-09-13 MED ORDER — PROPOFOL 10 MG/ML IV BOLUS
INTRAVENOUS | Status: AC
  Filled 2015-09-13: qty 20

## 2015-09-13 MED ORDER — OXYCODONE-ACETAMINOPHEN 5-325 MG PO TABS: 1.0000 | ORAL_TABLET | Freq: Four times a day (QID) | ORAL | Status: DC | PRN

## 2015-09-13 MED ORDER — MIDAZOLAM HCL 5 MG/5ML IJ SOLN
INTRAMUSCULAR | Status: DC | PRN
  Administered 2015-09-13: 2 mg via INTRAVENOUS

## 2015-09-13 SURGICAL SUPPLY — 28 items
BAG DRAIN URO-CYSTO SKYTR STRL (DRAIN) ×2 IMPLANT
BASKET LASER NITINOL 1.9FR (BASKET) IMPLANT
BASKET STNLS GEMINI 4WIRE 3FR (BASKET) IMPLANT
BASKET ZERO TIP NITINOL 2.4FR (BASKET) IMPLANT
CATH INTERMIT  6FR 70CM (CATHETERS) ×2 IMPLANT
CATH URET 5FR 28IN CONE TIP (BALLOONS)
CATH URET 5FR 28IN OPEN ENDED (CATHETERS) IMPLANT
CATH URET 5FR 70CM CONE TIP (BALLOONS) IMPLANT
CLOTH BEACON ORANGE TIMEOUT ST (SAFETY) ×2 IMPLANT
FIBER LASER FLEXIVA 365 (UROLOGICAL SUPPLIES) IMPLANT
FIBER LASER TRAC TIP (UROLOGICAL SUPPLIES) ×2 IMPLANT
GLOVE BIO SURGEON STRL SZ7.5 (GLOVE) ×2 IMPLANT
GLOVE SS N UNI LF 7.5 STRL (GLOVE) ×4 IMPLANT
GOWN STRL REUS W/ TWL LRG LVL3 (GOWN DISPOSABLE) ×1 IMPLANT
GOWN STRL REUS W/ TWL XL LVL3 (GOWN DISPOSABLE) ×1 IMPLANT
GOWN STRL REUS W/TWL LRG LVL3 (GOWN DISPOSABLE) ×1
GOWN STRL REUS W/TWL XL LVL3 (GOWN DISPOSABLE) ×1
GUIDEWIRE 0.038 PTFE COATED (WIRE) IMPLANT
GUIDEWIRE ANG ZIPWIRE 038X150 (WIRE) IMPLANT
GUIDEWIRE STR DUAL SENSOR (WIRE) ×2 IMPLANT
IV NS IRRIG 3000ML ARTHROMATIC (IV SOLUTION) ×2 IMPLANT
KIT ROOM TURNOVER WOR (KITS) ×2 IMPLANT
MANIFOLD NEPTUNE II (INSTRUMENTS) ×2 IMPLANT
PACK CYSTO (CUSTOM PROCEDURE TRAY) ×2 IMPLANT
SHEATH ACCESS URETERAL 38CM (SHEATH) ×2 IMPLANT
STENT POLARIS LOOP 6FR X 26 CM (STENTS) ×2 IMPLANT
SYRINGE IRR TOOMEY STRL 70CC (SYRINGE) IMPLANT
TUBE CONNECTING 12X1/4 (SUCTIONS) IMPLANT

## 2015-09-13 NOTE — Brief Op Note (Signed)
09/13/2015  10:33 AM  PATIENT:  [name] [name]  55 y.o. male  PRE-OPERATIVE DIAGNOSIS:  RIGHT RENAL STONE  POST-OPERATIVE DIAGNOSIS:  RIGHT RENAL STONE  PROCEDURE:  Procedure(s): 1ST STAGE - CYSTOSCOPY/URETEROSCOPY/HOLMIUM LASER/STENT PLACEMENT (Right) HOLMIUM LASER APPLICATION (Right)  SURGEON:  Surgeon(s) and Role:    * Alexis Frock, MD - Primary  PHYSICIAN ASSISTANT:   ASSISTANTS: none   ANESTHESIA:   general  EBL:  Total I/O In: 200 [I.V.:200] Out: -   BLOOD ADMINISTERED:none  DRAINS: none   LOCAL MEDICATIONS USED:  NONE  SPECIMEN:  No Specimen  DISPOSITION OF SPECIMEN:  N/A  COUNTS:  YES  TOURNIQUET:  * No tourniquets in log *  DICTATION: .Other Dictation: Dictation Number [id number]  PLAN OF CARE: Discharge to home after PACU  PATIENT DISPOSITION:  PACU - hemodynamically stable.   Delay start of Pharmacological VTE agent (>24hrs) due to surgical blood loss or risk of bleeding: yes

## 2015-09-13 NOTE — Transfer of Care (Signed)
Immediate Anesthesia Transfer of Care Note  Patient: [name] [name]  Procedure(s) Performed: Procedure(s) (LRB): 1ST STAGE - CYSTOSCOPY/URETEROSCOPY/HOLMIUM LASER/STENT PLACEMENT (Right) HOLMIUM LASER APPLICATION (Right)  Patient Location: PACU  Anesthesia Type: General  Level of Consciousness: awake, sedated, patient cooperative and responds to stimulation  Airway & Oxygen Therapy: Patient Spontanous Breathing and Patient connected to face mask oxygen  Post-op Assessment: Report given to PACU RN, Post -op Vital signs reviewed and stable and Patient moving all extremities  Post vital signs: Reviewed and stable  Complications: No apparent anesthesia complications

## 2015-09-13 NOTE — Discharge Instructions (Signed)
1 - You may have urinary urgency (bladder spasms) and bloody urine on / off with stent in place. This is normal. ° °2 - Call MD or go to ER for fever >102, severe pain / nausea / vomiting not relieved by medications, or acute change in medical status °[facility] °[phone] °Post Ureteroscopy With or Without Stent Instructions ° °Definitions: ° °Ureter: The duct that transports urine from the kidney to the bladder. °Stent:   A plastic hollow tube that is placed into the ureter, from the kidney to the                 bladder to prevent the ureter from swelling shut. ° °GENERAL INSTRUCTIONS: ° °Despite the fact that no skin incisions were used, the area around the ureter and bladder is raw and irritated. The stent is a foreign body which will further irritate the bladder wall. This irritation is manifested by increased frequency of urination, both day and night, and by an increase in the urge to urinate. In some, the urge to urinate is present almost always. Sometimes the urge is strong enough that you may not be able to stop yourself from urinating. The only real cure is to remove the stent and then give time for the bladder wall to heal which can't be done until the danger of the ureter swelling shut has passed, which varies. ° °You may see some blood in your urine while the stent is in place and a few days afterwards. Do not be alarmed, even if the urine was clear for a while. Get off your feet and drink lots of fluids until clearing occurs. If you start to pass clots or don't improve, call us. ° °DIET: °You may return to your normal diet immediately. Because of the raw surface of your bladder, alcohol, spicy foods, acid type foods and drinks with caffeine may cause irritation or frequency and should be used in moderation. To keep your urine flowing freely and to avoid constipation, drink plenty of fluids during the day ( 8-10 glasses ). °Tip: Avoid cranberry juice because it is very  acidic. ° °ACTIVITY: °Your physical activity doesn't need to be restricted. However, if you are very active, you may see some blood in your urine. We suggest that you reduce your activity under these circumstances until the bleeding has stopped. ° °BOWELS: °It is important to keep your bowels regular during the postoperative period. Straining with bowel movements can cause bleeding. A bowel movement every other day is reasonable. Use a mild laxative if needed, such as Milk of Magnesia 2-3 tablespoons, or 2 Dulcolax tablets. Call if you continue to have problems. If you have been taking narcotics for pain, before, during or after your surgery, you may be constipated. Take a laxative if necessary. ° ° °MEDICATION: °You should resume your pre-surgery medications unless told not to. In addition you will often be given an antibiotic to prevent infection. These should be taken as prescribed until the bottles are finished unless you are having an unusual reaction to one of the drugs. ° °PROBLEMS YOU SHOULD REPORT TO US: °· Fevers over 100.5 Fahrenheit. °· Heavy bleeding, or clots ( See above notes about blood in urine ). °· Inability to urinate. °· Drug reactions ( hives, rash, nausea, vomiting, diarrhea ). °· Severe burning or pain with urination that is not improving. ° °FOLLOW-UP: °You will need a follow-up appointment to monitor your progress. Call for this appointment at the number listed above.   Usually the first appointment will be about three to fourteen days after your surgery. ° ° ° ° ° °Post Anesthesia Home Care Instructions ° °Activity: °Get plenty of rest for the remainder of the day. A responsible adult should stay with you for 24 hours following the procedure.  °For the next 24 hours, DO NOT: °-Drive a car °-Operate machinery °-Drink alcoholic beverages °-Take any medication unless instructed by your physician °-Make any legal decisions or sign important papers. ° °Meals: °Start with liquid foods such as  gelatin or soup. Progress to regular foods as tolerated. Avoid greasy, spicy, heavy foods. If nausea and/or vomiting occur, drink only clear liquids until the nausea and/or vomiting subsides. Call your physician if vomiting continues. ° °Special Instructions/Symptoms: °Your throat may feel dry or sore from the anesthesia or the breathing tube placed in your throat during surgery. If this causes discomfort, gargle with warm salt water. The discomfort should disappear within 24 hours. ° °If you had a scopolamine patch placed behind your ear for the management of post- operative nausea and/or vomiting: ° °1. The medication in the patch is effective for 72 hours, after which it should be removed.  Wrap patch in a tissue and discard in the trash. Wash hands thoroughly with soap and water. °2. You may remove the patch earlier than 72 hours if you experience unpleasant side effects which may include dry mouth, dizziness or visual disturbances. °3. Avoid touching the patch. Wash your hands with soap and water after contact with the patch. °  ° °

## 2015-09-13 NOTE — Anesthesia Procedure Notes (Signed)
Procedure Name: LMA Insertion Date/Time: 09/13/2015 9:28 AM Performed by: Justice Rocher Pre-anesthesia Checklist: Patient identified, Emergency Drugs available, Suction available and Patient being monitored Patient Re-evaluated:Patient Re-evaluated prior to inductionOxygen Delivery Method: Circle System Utilized Preoxygenation: Pre-oxygenation with 100% oxygen Intubation Type: IV induction Ventilation: Mask ventilation without difficulty LMA: LMA inserted LMA Size: 5.0 Number of attempts: 1 Airway Equipment and Method: Bite block Placement Confirmation: positive ETCO2 Tube secured with: Tape Dental Injury: Teeth and Oropharynx as per pre-operative assessment

## 2015-09-13 NOTE — Anesthesia Preprocedure Evaluation (Addendum)
Anesthesia Evaluation  Patient identified by MRN, date of birth, ID band Patient awake    Reviewed: Allergy & Precautions, NPO status , Patient's Chart, lab work & pertinent test results  Airway Mallampati: II  TM Distance: >3 FB Neck ROM: Full    Dental no notable dental hx.    Pulmonary Current Smoker,    Pulmonary exam normal breath sounds clear to auscultation       Cardiovascular hypertension, Pt. on medications Normal cardiovascular exam Rhythm:Regular Rate:Normal     Neuro/Psych negative neurological ROS  negative psych ROS   GI/Hepatic negative GI ROS, Neg liver ROS,   Endo/Other  negative endocrine ROS  Renal/GU negative Renal ROS  negative genitourinary   Musculoskeletal negative musculoskeletal ROS (+)   Abdominal   Peds negative pediatric ROS (+)  Hematology negative hematology ROS (+)   Anesthesia Other Findings   Reproductive/Obstetrics negative OB ROS                            Anesthesia Physical Anesthesia Plan  ASA: II  Anesthesia Plan: General   Post-op Pain Management:    Induction: Intravenous  Airway Management Planned: LMA and Oral ETT  Additional Equipment:   Intra-op Plan:   Post-operative Plan: Extubation in OR  Informed Consent: I have reviewed the patients History and Physical, chart, labs and discussed the procedure including the risks, benefits and alternatives for the proposed anesthesia with the patient or authorized representative who has indicated his/her understanding and acceptance.   Dental advisory given  Plan Discussed with: CRNA  Anesthesia Plan Comments:         Anesthesia Quick Evaluation

## 2015-09-13 NOTE — Op Note (Signed)
NAME:  JYE, MOOS NO.:  MEDICAL RECORD NO.:  [id number]  LOCATION:                                 FACILITY:  PHYSICIAN:  Alexis Frock, MD          DATE OF BIRTH:  DATE OF PROCEDURE: 09/13/2015                              OPERATIVE REPORT  DIAGNOSES:  Large right renal pelvis stone, history of hematuria.  PROCEDURES: 1. Cystoscopy with right retrograde pyelogram and interpretation. 2. Right first-stage ureteroscopy with laser lithotripsy. 3. Insertion of right ureteral stent, 5 x 26, Polaris, no tether.  ESTIMATED BLOOD LOSS:  Nil.  COMPLICATION:  None.  SPECIMEN:  None.  FINDINGS: 1. Unremarkable bladder and urethra. 2. Mobile filling defect in the renal pelvis consistent with known     large renal pelvis stone. 3. Using dusting technique, ablation of approximately 50-70% of all     visible stone material. 4. Successful placement of right ureteral stent, proximal in the renal     pelvis and distal in the urinary bladder.  INDICATION:  Mr. Midence is a very pleasant 55 year old gentleman, who was found on workup of microscopic hematuria to have a relatively large right renal pelvis stone, this was nearly 2 cm.  He does endorse a history of very intermittent right-sided flank pain likely representing ball-valving of this large stone.  Options were discussed for management including observation versus percutaneous nephrostolithotomy versus shockwave lithotripsy versus staged ureteroscopy and he wished to proceed with the latter.  Informed consent was obtained and placed in the medical record.  PROCEDURE IN DETAIL:  The patient being Jorrel Cripe, was verified.  Procedure being right first-stage ureteroscopic stone manipulation was confirmed.  Procedure was carried out.  Time-out was performed.  Intravenous antibiotics were administered.  General LMA anesthesia was introduced.  The patient was placed into a low lithotomy position  and sterile field was created by prepping and draping the patient's penis, perineum and proximal thighs using iodine x3.  Next, cystourethroscopy was performed using a 23-French rigid cystoscope with 30-degree offset lens.  Inspection of the anterior and posterior urethra was unremarkable.  Inspection of the urinary bladder revealed no diverticula, calcifications, papular lesions.  The right ureteral orifice was cannulated with a 6-French end-hole catheter and right retrograde pyelogram was obtained.  Right retrograde pyelogram demonstrated a single right ureter with single-system right kidney.  There was no hydroureteronephrosis.  There was a large filling defect in the renal pelvis consistent with known stone.  It was not obviously acutely obstructing.  A 0.038 Zip wire was advanced at the level of the upper pole, set aside as a safety wire. Next, semi-rigid ureteroscopy was performed, alongside, a separate Sensor working wire to the level of proximal ureter, no mucosal abnormalities were found.  An 8-French feeding tube was placed in the urinary bladder for pressure release.  Next, the semi-rigid ureteroscope was exchanged for a 12/14, 38-cm ureteral access sheath using continuous fluoroscopic guidance to the level of proximal fourth of the ureter. Next, the flexible digital dual-channel ureteroscope was then used to perform flexible ureteroscopy of the more proximal ureter.  In the proximal fourth-fifth of the ureter, the caliber was  set aside, it did not allow easy safe passage of the dual-channel ureteroscope. Therefore, this was exchanged for the single-channel flexible ureteroscope, which was smaller in caliber and it did easily and successfully navigate to the level of renal pelvis.  There was no obvious stricturing of the area of proximal ureter.  This was felt to be just a physiologic narrowing.  The stone in question was encountered, it was very large as anticipated and  mobile, likely representing intermittent ball-valving.  As such, holmium laser energy was applied to the stone using settings of 0.3 joules and 50 hertz using a dusting technique ablating approximately 50-70% of the stone into dust or dust- like fragments.  Inherently, smaller fragments were also generated 1-3 mm in diameter.  These were most concentrated in the mid and lower poles.  A popcorning technique was then used on these fragments using settings of 1.8 joules and 10 hertz, allowing them to fragment into even smaller pieces.  Following these maneuvers, there was excellent hemostasis, no evidence of perforation.  We had applied holmium laser energy to the stone for well over an hour and so that, we achieved goals of first-stage procedure today.  The plan being to place a stent at this point, allow for interval passage of stone dust and second-stage procedure in a few weeks.  As such, the access sheath was removed under continuous ureteroscopic vision and no significant mucosal abnormalities were found and a new 5 x 26 Polaris-type stent was placed with remaining safety wire using cystoscopic and fluoroscopic guidance.  Good proximal and distal deployment were noted.  Bladder was emptied per cystoscope. Procedure was then terminated.  The patient tolerated the procedure well.  There were no immediate periprocedural complications.  The patient was taken to the postanesthesia care unit in stable condition.          ______________________________ Alexis Frock, MD     TM/MEDQ  D:  09/13/2015  T:  09/13/2015  Job:  [id number]

## 2015-09-13 NOTE — Anesthesia Postprocedure Evaluation (Signed)
Anesthesia Post Note  Patient: [name] [name]  Procedure(s) Performed: Procedure(s) (LRB): 1ST STAGE - CYSTOSCOPY/URETEROSCOPY/HOLMIUM LASER/STENT PLACEMENT (Right) HOLMIUM LASER APPLICATION (Right)  Patient location during evaluation: PACU Anesthesia Type: General Level of consciousness: awake and alert Pain management: pain level controlled Vital Signs Assessment: post-procedure vital signs reviewed and stable Respiratory status: spontaneous breathing, nonlabored ventilation, respiratory function stable and patient connected to nasal cannula oxygen Cardiovascular status: blood pressure returned to baseline and stable Postop Assessment: no signs of nausea or vomiting Anesthetic complications: no    Last Vitals:  Filed Vitals:   09/13/15 1100 09/13/15 1115  BP: 141/92 145/93  Pulse: 68 66  Temp:    Resp: 8 12    Last Pain:  Filed Vitals:   09/13/15 1120  PainSc: Asleep                 Montez Hageman

## 2015-09-13 NOTE — H&P (Signed)
[name] [name] is an 55 y.o. male.    Chief Complaint: Pre-OP Rt First Stage Ureteroscopic Stone Manipulation  HPI:     1 - Asymptomatic microscopic hematuria - sig blood on UA x several noted by PCP. >30PY smoker, still smokes. No chronic solvent exposure. He does have Gout. CT urogram 08/2015 w/o GU masses, just large right renal stone. Cysto 08/2015 normal.   2 - Prostate screening - No FHX prostate cancer 2016 PSA 0.14 (PCP labs) / DRE 45gm smooth  3 -  Nephrolithiasis - Incidetnal Rt 105mm renal pelvis stone with few ipsulateral punctate renal stones by hematuria CT 2017. Stone is 58mm, 1400HU, SSD 8cm.   PMH sig for HLD, Otho surgery, Gout. No strong blood thinners. His PCP is Garret Reddish MD with [facility].   Today " DP " is seen to proceed with right first stage ureteroscopic stone manipulation for large right renal stone,   Past Medical History  Diagnosis Date  . Hypertension   . Hyperlipidemia   . Raynaud's syndrome   . Renal calculus, right   . ED (erectile dysfunction)   . Testicular hypogonadism   . Microhematuria   . History of adenomatous polyp of colon     05-27-2015  . Arthritis   . History of gout     no issue since 2012  . Wears glasses     Past Surgical History  Procedure Laterality Date  . Biceps tendon repair Right 2003  . Total shoulder arthroplasty Right 08/17/2013    RIGHT TOTAL SHOULDER ARTHROPLASTY;  Surgeon: [name], MD;  Location: [place];  Service: Orthopedics;  Laterality: Right;  . Colonoscopy  05-27-2015    Family History  Problem Relation Age of Onset  . Coronary artery disease Father     CABG 107, former smoker  . Diabetes Father   . Hypertension Father   . Hyperlipidemia Mother     trig  . Hypertension Sister     HLD  . Colon cancer Neg Hx   . Esophageal cancer Neg Hx   . Rectal cancer Neg Hx   . Stomach cancer Neg Hx   . Prostate cancer Neg Hx    Social History:  reports that he has been smoking Cigarettes  and Cigars.  He has a 8.75 pack-year smoking history. He has never used smokeless tobacco. He reports that he drinks about 1.2 - 2.4 oz of alcohol per week. He reports that he does not use illicit drugs.  Allergies:  Allergies  Allergen Reactions  . Lisinopril Cough    No prescriptions prior to admission    No results found for this or any previous visit (from the past 48 hour(s)). No results found.  Review of Systems  Constitutional: Negative.   HENT: Negative.   Respiratory: Negative.   Cardiovascular: Negative.   Gastrointestinal: Negative.   Genitourinary: Positive for hematuria. Negative for flank pain.  Musculoskeletal: Negative.   Skin: Negative.   Neurological: Negative.   Endo/Heme/Allergies: Negative.   Psychiatric/Behavioral: Negative.     Height 5\' 9"  (1.753 m), weight 81.647 kg (180 lb). Physical Exam  Constitutional: He appears well-developed.  HENT:  Head: Normocephalic.  Eyes: Pupils are equal, round, and reactive to light.  Neck: Normal range of motion.  Cardiovascular: Normal rate.   Respiratory: Effort normal.  GI: Soft.  Genitourinary:  No  CVAT  Musculoskeletal: Normal range of motion.  Neurological: He is alert.  Skin: Skin is warm.  Psychiatric: He has a normal  mood and affect. His behavior is normal. Judgment and thought content normal.     Assessment/Plan   1 - Asymptomatic microscopic hematuria - likely due to large Rt renal stone as per below. NO malignancy on eval which is fortunate.   2 - Prostate screening - up to date this year, agree with annual screening until age 59 or so as average risk.   3 - Nephrolithiasis - present stone non-obstructing but fairly large nd quite dense. Not passable size, not candidate for SWL.   We rediscussed ureteroscopic stone manipulation with basketing and laser-lithotripsy in detail.  We rediscussed risks including bleeding, infection, damage to kidney / ureter  bladder, rarely loss of kidney. We  rediscussed anesthetic risks and rare but serious surgical complications including DVT, PE, MI, and mortality. We specifically readdressed that in 5-10% of cases a staged approach is required with stenting followed by re-attempt ureteroscopy if anatomy unfavorable.   The patient voiced understanding and wishs to proceed today as planned.   Alexis Frock, MD 09/13/2015, 6:08 AM

## 2015-09-16 ENCOUNTER — Encounter (HOSPITAL_BASED_OUTPATIENT_CLINIC_OR_DEPARTMENT_OTHER): Payer: Self-pay | Admitting: Urology

## 2015-09-16 ENCOUNTER — Other Ambulatory Visit: Payer: Self-pay | Admitting: *Deleted

## 2015-09-16 MED ORDER — AMLODIPINE BESYLATE 5 MG PO TABS: 5.0000 mg | ORAL_TABLET | Freq: Every day | ORAL | Status: DC

## 2015-09-19 ENCOUNTER — Encounter (HOSPITAL_BASED_OUTPATIENT_CLINIC_OR_DEPARTMENT_OTHER): Payer: Self-pay | Admitting: *Deleted

## 2015-09-19 NOTE — Progress Notes (Signed)
Hx reviewed w pt. Pt states no changes from last visit.  Pt instructed npo p mn 3/23 x norvasc, lipitor, w sip of water. Ok to use flonase and pain med if needed.  Pt aware no smoking p mn as well. Needs istat 8 on arrival.  ekg in epic

## 2015-09-23 NOTE — Anesthesia Preprocedure Evaluation (Addendum)
Anesthesia Evaluation  Patient identified by MRN, date of birth, ID band Patient awake    Reviewed: Allergy & Precautions, NPO status , Patient's Chart, lab work & pertinent test results  Airway Mallampati: II  TM Distance: >3 FB Neck ROM: Full    Dental no notable dental hx. (+) Dental Advisory Given, Teeth Intact   Pulmonary Current Smoker,    Pulmonary exam normal breath sounds clear to auscultation       Cardiovascular hypertension, Pt. on medications + Peripheral Vascular Disease (Raynaeds)  Normal cardiovascular exam Rhythm:Regular Rate:Normal  EKG partial RBB 09/2015   Neuro/Psych negative neurological ROS  negative psych ROS   GI/Hepatic negative GI ROS, Neg liver ROS,   Endo/Other  negative endocrine ROS  Renal/GU negative Renal ROSStones  negative genitourinary   Musculoskeletal negative musculoskeletal ROS (+)   Abdominal   Peds negative pediatric ROS (+)  Hematology negative hematology ROS (+) 15/45   Anesthesia Other Findings   Reproductive/Obstetrics negative OB ROS                            Anesthesia Physical Anesthesia Plan  ASA: II  Anesthesia Plan: General   Post-op Pain Management:    Induction: Intravenous  Airway Management Planned: LMA  Additional Equipment:   Intra-op Plan:   Post-operative Plan: Extubation in OR  Informed Consent: I have reviewed the patients History and Physical, chart, labs and discussed the procedure including the risks, benefits and alternatives for the proposed anesthesia with the patient or authorized representative who has indicated his/her understanding and acceptance.     Plan Discussed with:   Anesthesia Plan Comments: (LMA 5 used last time)        Anesthesia Quick Evaluation

## 2015-09-27 ENCOUNTER — Ambulatory Visit (HOSPITAL_BASED_OUTPATIENT_CLINIC_OR_DEPARTMENT_OTHER): Payer: Managed Care, Other (non HMO) | Admitting: Anesthesiology

## 2015-09-27 ENCOUNTER — Ambulatory Visit (HOSPITAL_BASED_OUTPATIENT_CLINIC_OR_DEPARTMENT_OTHER)
Admission: RE | Admit: 2015-09-27 | Discharge: 2015-09-27 | Disposition: A | Discharge status: Home / Self Care | Payer: Managed Care, Other (non HMO) | Source: Ambulatory Visit | Attending: Urology | Admitting: Urology

## 2015-09-27 ENCOUNTER — Encounter (HOSPITAL_BASED_OUTPATIENT_CLINIC_OR_DEPARTMENT_OTHER)
Admission: RE | Disposition: A | Discharge status: Home / Self Care | Payer: Self-pay | Source: Ambulatory Visit | Attending: Urology

## 2015-09-27 ENCOUNTER — Encounter (HOSPITAL_BASED_OUTPATIENT_CLINIC_OR_DEPARTMENT_OTHER): Payer: Self-pay | Admitting: Certified Registered"

## 2015-09-27 DIAGNOSIS — I7300 Raynaud's syndrome without gangrene: Secondary | ICD-10-CM | POA: Diagnosis not present

## 2015-09-27 DIAGNOSIS — I739 Peripheral vascular disease, unspecified: Secondary | ICD-10-CM | POA: Diagnosis not present

## 2015-09-27 DIAGNOSIS — I10 Essential (primary) hypertension: Secondary | ICD-10-CM | POA: Diagnosis not present

## 2015-09-27 DIAGNOSIS — N200 Calculus of kidney: Secondary | ICD-10-CM | POA: Diagnosis present

## 2015-09-27 DIAGNOSIS — M109 Gout, unspecified: Secondary | ICD-10-CM | POA: Insufficient documentation

## 2015-09-27 DIAGNOSIS — E291 Testicular hypofunction: Secondary | ICD-10-CM | POA: Diagnosis not present

## 2015-09-27 DIAGNOSIS — Z79899 Other long term (current) drug therapy: Secondary | ICD-10-CM | POA: Diagnosis not present

## 2015-09-27 DIAGNOSIS — F17210 Nicotine dependence, cigarettes, uncomplicated: Secondary | ICD-10-CM | POA: Diagnosis not present

## 2015-09-27 DIAGNOSIS — F17290 Nicotine dependence, other tobacco product, uncomplicated: Secondary | ICD-10-CM | POA: Insufficient documentation

## 2015-09-27 DIAGNOSIS — E785 Hyperlipidemia, unspecified: Secondary | ICD-10-CM | POA: Diagnosis not present

## 2015-09-27 DIAGNOSIS — Z96611 Presence of right artificial shoulder joint: Secondary | ICD-10-CM | POA: Diagnosis not present

## 2015-09-27 HISTORY — PX: CYSTOSCOPY WITH URETEROSCOPY, STONE BASKETRY AND STENT PLACEMENT: SHX6378

## 2015-09-27 HISTORY — PX: HOLMIUM LASER APPLICATION: SHX5852

## 2015-09-27 LAB — POCT I-STAT, CHEM 8
BUN: 14 mg/dL (ref 6–20)
Calcium, Ion: 1.24 mmol/L — ABNORMAL HIGH (ref 1.12–1.23)
Chloride: 107 mmol/L (ref 101–111)
Creatinine, Ser: 0.8 mg/dL (ref 0.61–1.24)
Glucose, Bld: 109 mg/dL — ABNORMAL HIGH (ref 65–99)
HCT: 46 % (ref 39.0–52.0)
Hemoglobin: 15.6 g/dL (ref 13.0–17.0)
Potassium: 3.8 mmol/L (ref 3.5–5.1)
Sodium: 142 mmol/L (ref 135–145)
TCO2: 22 mmol/L (ref 0–100)

## 2015-09-27 SURGERY — CYSTOSCOPY, WITH CALCULUS MANIPULATION OR REMOVAL
Anesthesia: General | Site: Ureter | Laterality: Right

## 2015-09-27 MED ORDER — FENTANYL CITRATE (PF) 100 MCG/2ML IJ SOLN
25.0000 ug | INTRAMUSCULAR | Status: DC | PRN
  Filled 2015-09-27: qty 1

## 2015-09-27 MED ORDER — KETOROLAC TROMETHAMINE 30 MG/ML IJ SOLN
INTRAMUSCULAR | Status: DC | PRN
  Administered 2015-09-27: 30 mg via INTRAVENOUS

## 2015-09-27 MED ORDER — GENTAMICIN IN SALINE 1.6-0.9 MG/ML-% IV SOLN
80.0000 mg | INTRAVENOUS | Status: DC
  Filled 2015-09-27: qty 50

## 2015-09-27 MED ORDER — SODIUM CHLORIDE 0.9 % IR SOLN
Status: DC | PRN
  Administered 2015-09-27: 1000 mL
  Administered 2015-09-27: 500 mL
  Administered 2015-09-27: 3000 mL

## 2015-09-27 MED ORDER — GENTAMICIN SULFATE 40 MG/ML IJ SOLN
5.0000 mg/kg | Freq: Once | INTRAMUSCULAR | Status: AC
  Administered 2015-09-27: 410 mg via INTRAVENOUS
  Filled 2015-09-27: qty 10.25

## 2015-09-27 MED ORDER — FENTANYL CITRATE (PF) 100 MCG/2ML IJ SOLN
INTRAMUSCULAR | Status: DC | PRN
  Administered 2015-09-27: 50 ug via INTRAVENOUS
  Administered 2015-09-27 (×3): 25 ug via INTRAVENOUS

## 2015-09-27 MED ORDER — PROMETHAZINE HCL 25 MG/ML IJ SOLN
6.2500 mg | INTRAMUSCULAR | Status: DC | PRN
  Filled 2015-09-27: qty 1

## 2015-09-27 MED ORDER — SULFAMETHOXAZOLE-TRIMETHOPRIM 800-160 MG PO TABS: 1.0000 | ORAL_TABLET | Freq: Two times a day (BID) | ORAL | Status: DC

## 2015-09-27 MED ORDER — LIDOCAINE HCL (CARDIAC) 20 MG/ML IV SOLN
INTRAVENOUS | Status: AC
  Filled 2015-09-27: qty 5

## 2015-09-27 MED ORDER — MEPERIDINE HCL 25 MG/ML IJ SOLN
6.2500 mg | INTRAMUSCULAR | Status: DC | PRN
  Filled 2015-09-27: qty 1

## 2015-09-27 MED ORDER — LACTATED RINGERS IV SOLN
INTRAVENOUS | Status: DC
  Administered 2015-09-27 (×2): via INTRAVENOUS
  Filled 2015-09-27: qty 1000

## 2015-09-27 MED ORDER — MIDAZOLAM HCL 5 MG/5ML IJ SOLN
INTRAMUSCULAR | Status: DC | PRN
  Administered 2015-09-27: 2 mg via INTRAVENOUS

## 2015-09-27 MED ORDER — DEXAMETHASONE SODIUM PHOSPHATE 10 MG/ML IJ SOLN
INTRAMUSCULAR | Status: AC
Start: 2015-09-27 — End: 2015-09-27
  Filled 2015-09-27: qty 1

## 2015-09-27 MED ORDER — PROPOFOL 10 MG/ML IV BOLUS
INTRAVENOUS | Status: DC | PRN
  Administered 2015-09-27: 180 mg via INTRAVENOUS

## 2015-09-27 MED ORDER — KETOROLAC TROMETHAMINE 30 MG/ML IJ SOLN
INTRAMUSCULAR | Status: AC
  Filled 2015-09-27: qty 1

## 2015-09-27 MED ORDER — ONDANSETRON HCL 4 MG/2ML IJ SOLN
INTRAMUSCULAR | Status: DC | PRN
  Administered 2015-09-27: 4 mg via INTRAVENOUS

## 2015-09-27 MED ORDER — LIDOCAINE HCL (CARDIAC) 20 MG/ML IV SOLN
INTRAVENOUS | Status: DC | PRN
  Administered 2015-09-27: 60 mg via INTRAVENOUS

## 2015-09-27 MED ORDER — PROPOFOL 10 MG/ML IV BOLUS
INTRAVENOUS | Status: AC
  Filled 2015-09-27: qty 20

## 2015-09-27 MED ORDER — FENTANYL CITRATE (PF) 100 MCG/2ML IJ SOLN
INTRAMUSCULAR | Status: AC
  Filled 2015-09-27: qty 2

## 2015-09-27 MED ORDER — FENTANYL CITRATE (PF) 250 MCG/5ML IJ SOLN
INTRAMUSCULAR | Status: AC
Start: 2015-09-27 — End: 2015-09-27
  Filled 2015-09-27: qty 5

## 2015-09-27 MED ORDER — ONDANSETRON HCL 4 MG/2ML IJ SOLN
INTRAMUSCULAR | Status: AC
  Filled 2015-09-27: qty 2

## 2015-09-27 MED ORDER — MIDAZOLAM HCL 2 MG/2ML IJ SOLN
INTRAMUSCULAR | Status: AC
  Filled 2015-09-27: qty 2

## 2015-09-27 MED ORDER — DEXAMETHASONE SODIUM PHOSPHATE 4 MG/ML IJ SOLN
INTRAMUSCULAR | Status: DC | PRN
  Administered 2015-09-27: 10 mg via INTRAVENOUS

## 2015-09-27 SURGICAL SUPPLY — 35 items
BAG DRAIN URO-CYSTO SKYTR STRL (DRAIN) ×2 IMPLANT
BASKET LASER NITINOL 1.9FR (BASKET) ×2 IMPLANT
BASKET STONE NCOMPASS (UROLOGICAL SUPPLIES) ×2 IMPLANT
BASKET ZERO TIP NITINOL 2.4FR (BASKET) IMPLANT
CATH INTERMIT  6FR 70CM (CATHETERS) ×2 IMPLANT
CATH URET 5FR 28IN OPEN ENDED (CATHETERS) IMPLANT
CLOTH BEACON ORANGE TIMEOUT ST (SAFETY) ×2 IMPLANT
ELECT REM PT RETURN 9FT ADLT (ELECTROSURGICAL)
ELECTRODE REM PT RTRN 9FT ADLT (ELECTROSURGICAL) IMPLANT
FIBER LASER FLEXIVA 365 (UROLOGICAL SUPPLIES) IMPLANT
FIBER LASER TRAC TIP (UROLOGICAL SUPPLIES) ×2 IMPLANT
GLOVE BIO SURGEON STRL SZ 6.5 (GLOVE) ×2 IMPLANT
GLOVE BIO SURGEON STRL SZ7 (GLOVE) ×2 IMPLANT
GLOVE BIO SURGEON STRL SZ7.5 (GLOVE) ×2 IMPLANT
GLOVE INDICATOR 6.5 STRL GRN (GLOVE) ×4 IMPLANT
GOWN STRL REUS W/TWL XL LVL3 (GOWN DISPOSABLE) ×4 IMPLANT
GUIDEWIRE 0.038 PTFE COATED (WIRE) IMPLANT
GUIDEWIRE ANG ZIPWIRE 038X150 (WIRE) ×2 IMPLANT
GUIDEWIRE STR DUAL SENSOR (WIRE) ×2 IMPLANT
IV NS 1000ML (IV SOLUTION) ×1
IV NS 1000ML BAXH (IV SOLUTION) ×1 IMPLANT
IV NS IRRIG 3000ML ARTHROMATIC (IV SOLUTION) ×2 IMPLANT
KIT BALLIN UROMAX 15FX10 (LABEL) IMPLANT
KIT BALLN UROMAX 15FX4 (MISCELLANEOUS) IMPLANT
KIT BALLN UROMAX 26 75X4 (MISCELLANEOUS)
KIT ROOM TURNOVER WOR (KITS) ×2 IMPLANT
MANIFOLD NEPTUNE II (INSTRUMENTS) ×2 IMPLANT
PACK CYSTO (CUSTOM PROCEDURE TRAY) ×2 IMPLANT
SET HIGH PRES BAL DIL (LABEL)
SHEATH ACCESS URETERAL 38CM (SHEATH) ×2 IMPLANT
STENT POLARIS 5FRX26 (STENTS) ×2 IMPLANT
SYRINGE 10CC LL (SYRINGE) ×2 IMPLANT
SYRINGE IRR TOOMEY STRL 70CC (SYRINGE) IMPLANT
TUBE CONNECTING 12X1/4 (SUCTIONS) ×2 IMPLANT
TUBE FEEDING 8FR 16IN STR KANG (MISCELLANEOUS) IMPLANT

## 2015-09-27 NOTE — H&P (Signed)
[name] [name] is an 55 y.o. male.    Chief Complaint: Pre-op RIGHT 2nd stage ureteroscopy / laser lithotripsy  HPI:      1 - Asymptomatic microscopic hematuria - sig blood on UA x several noted by PCP. >30PY smoker, still smokes. No chronic solvent exposure. He does have Gout. CT urogram 08/2015 w/o GU masses, just large right renal stone. Cysto 08/2015 normal.   2 - Prostate screening - No FHX prostate cancer 2016 PSA 0.14 (PCP labs) / DRE 45gm smooth  3 -  Nephrolithiasis - Incidetnal Rt 46mm renal pelvis stone with few ipsulateral punctate renal stones by hematuria CT 2017. Stone is 18mm, 1400HU, SSD 8cm.  Underwent 1st stage ureteroscopy / laser lithotripsy 09/13/2015.   PMH sig for HLD, Otho surgery, Gout. No strong blood thinners. His PCP is Garret Reddish MD with [facility].   Today " DP " is seen to proceed with right second stage ureteroscopic stone manipulation for large right renal stone. He has passed some interval stone debris as expected.   Past Medical History  Diagnosis Date  . Hypertension   . Hyperlipidemia   . Raynaud's syndrome   . Renal calculus, right   . ED (erectile dysfunction)   . Testicular hypogonadism   . Microhematuria   . History of adenomatous polyp of colon     05-27-2015  . Arthritis   . History of gout     no issue since 2012  . Wears glasses     Past Surgical History  Procedure Laterality Date  . Biceps tendon repair Right 2003  . Total shoulder arthroplasty Right 08/17/2013    RIGHT TOTAL SHOULDER ARTHROPLASTY;  Surgeon: [name], MD;  Location: [place];  Service: Orthopedics;  Laterality: Right;  . Colonoscopy  05-27-2015  . Cystoscopy/ureteroscopy/holmium laser/stent placement Right 09/13/2015    Procedure: 1ST STAGE - CYSTOSCOPY/URETEROSCOPY/HOLMIUM LASER/STENT PLACEMENT;  Surgeon: Alexis Frock, MD;  Location: [facility];  Service: Urology;  Laterality: Right;  . Holmium laser application Right [id number]     Procedure: HOLMIUM LASER APPLICATION;  Surgeon: Alexis Frock, MD;  Location: [facility];  Service: Urology;  Laterality: Right;    Family History  Problem Relation Age of Onset  . Coronary artery disease Father     CABG 45, former smoker  . Diabetes Father   . Hypertension Father   . Hyperlipidemia Mother     trig  . Hypertension Sister     HLD  . Colon cancer Neg Hx   . Esophageal cancer Neg Hx   . Rectal cancer Neg Hx   . Stomach cancer Neg Hx   . Prostate cancer Neg Hx    Social History:  reports that he has been smoking Cigarettes and Cigars.  He has a 8.75 pack-year smoking history. He has never used smokeless tobacco. He reports that he drinks about 1.2 - 2.4 oz of alcohol per week. He reports that he does not use illicit drugs.  Allergies:  Allergies  Allergen Reactions  . Lisinopril Cough    No prescriptions prior to admission    No results found for this or any previous visit (from the past 48 hour(s)). No results found.  Review of Systems  Constitutional: Negative.  Negative for fever and chills.  HENT: Negative.   Eyes: Negative.   Respiratory: Negative.   Cardiovascular: Negative.   Gastrointestinal: Negative.  Negative for nausea and vomiting.  Genitourinary: Positive for hematuria.  Stent colic symptoms  Musculoskeletal: Negative.   Skin: Negative.   Neurological: Negative.   Endo/Heme/Allergies: Negative.   Psychiatric/Behavioral: Negative.     Height 5\' 9"  (1.753 m), weight 83.008 kg (183 lb). Physical Exam  Constitutional: He appears well-developed.  HENT:  Head: Normocephalic.  Eyes: Pupils are equal, round, and reactive to light.  Neck: Normal range of motion.  Cardiovascular: Normal rate.   Respiratory: Effort normal.  GI: Soft.  Genitourinary:  No CVAT  Musculoskeletal: Normal range of motion.  Neurological: He is alert.  Skin: Skin is warm.  Psychiatric: He has a normal mood and affect. His behavior is  normal. Judgment and thought content normal.     Assessment/Plan   1 - Asymptomatic microscopic hematuria - likely due to large Rt renal stone as per below. NO malignancy on eval which is fortunate.   2 - Prostate screening - up to date this year, agree with annual screening until age 83 or so as average risk.   3 - Nephrolithiasis - proceed today as planned with 2nd stage surgery with goal of stone free.   We rediscussed ureteroscopic stone manipulation with basketing and laser-lithotripsy in detail.  We rediscussed risks including bleeding, infection, damage to kidney / ureter  bladder, rarely loss of kidney. We rediscussed anesthetic risks and rare but serious surgical complications including DVT, PE, MI, and mortality. We specifically readdressed that in 5-10% of cases a staged approach is required with stenting followed by re-attempt ureteroscopy if anatomy unfavorable.   The patient voiced understanding and wishs to proceed today as planned.   Alexis Frock, MD 09/27/2015, 6:06 AM

## 2015-09-27 NOTE — Anesthesia Procedure Notes (Signed)
Procedure Name: LMA Insertion Date/Time: 09/27/2015 10:26 AM Performed by: Denna Haggard D Pre-anesthesia Checklist: Patient identified, Emergency Drugs available, Suction available and Patient being monitored Patient Re-evaluated:Patient Re-evaluated prior to inductionOxygen Delivery Method: Circle System Utilized Preoxygenation: Pre-oxygenation with 100% oxygen Intubation Type: IV induction Ventilation: Mask ventilation without difficulty LMA: LMA inserted LMA Size: 4.0 Number of attempts: 1 Airway Equipment and Method: Bite block Placement Confirmation: positive ETCO2 Tube secured with: Tape Dental Injury: Teeth and Oropharynx as per pre-operative assessment

## 2015-09-27 NOTE — Brief Op Note (Signed)
09/27/2015  12:07 PM  PATIENT:  [name] [name]  55 y.o. male  PRE-OPERATIVE DIAGNOSIS:  RIGHT RENAL STONE  POST-OPERATIVE DIAGNOSIS:  RIGHT RENAL STONE  PROCEDURE:  Procedure(s): 2ND STAGE CYSTOSCOPY RIGHT URETEROSCOPY STENT REPLACEMENT (Right) HOLMIUM LASER (Right)  SURGEON:  Surgeon(s) and Role:    * Alexis Frock, MD - Primary  PHYSICIAN ASSISTANT:   ASSISTANTS: none   ANESTHESIA:   general  EBL:  Total I/O In: 1000 [I.V.:1000] Out: 5 [Blood:5]  BLOOD ADMINISTERED:none  DRAINS: none   LOCAL MEDICATIONS USED:  NONE  SPECIMEN:  Source of Specimen:  right renal stone fragmetns  DISPOSITION OF SPECIMEN:  [facility] for compositional analysis  COUNTS:  YES  TOURNIQUET:  * No tourniquets in log *  DICTATION: .Other Dictation: Dictation Number [id number]  PLAN OF CARE: Discharge to home after PACU  PATIENT DISPOSITION:  PACU - hemodynamically stable.   Delay start of Pharmacological VTE agent (>24hrs) due to surgical blood loss or risk of bleeding: not applicable

## 2015-09-27 NOTE — Transfer of Care (Signed)
Immediate Anesthesia Transfer of Care Note  Patient: [name] [name]  Procedure(s) Performed: Procedure(s) (LRB): 2ND STAGE CYSTOSCOPY RIGHT URETEROSCOPY STENT REPLACEMENT (Right) HOLMIUM LASER (Right)  Patient Location: PACU  Anesthesia Type: General  Level of Consciousness: awake, oriented, sedated and patient cooperative  Airway & Oxygen Therapy: Patient Spontanous Breathing and Patient connected to face mask oxygen  Post-op Assessment: Report given to PACU RN and Post -op Vital signs reviewed and stable  Post vital signs: Reviewed and stable  Complications: No apparent anesthesia complications  Last Vitals:  Filed Vitals:   09/27/15 0916 09/27/15 1217  BP: 129/82 143/85  Pulse: 64 70  Temp: 36.6 C 36.5 C  Resp: 14 12

## 2015-09-27 NOTE — Discharge Instructions (Signed)
1 - You may have urinary urgency (bladder spasms) and bloody urine on / off with stent in place. This is normal. ° °2 - Call MD or go to ER for fever >102, severe pain / nausea / vomiting not relieved by medications, or acute change in medical status °[facility] °[phone] °Post Ureteroscopy With or Without Stent Instructions ° °Definitions: ° °Ureter: The duct that transports urine from the kidney to the bladder. °Stent:   A plastic hollow tube that is placed into the ureter, from the kidney to the                 bladder to prevent the ureter from swelling shut. ° °GENERAL INSTRUCTIONS: ° °Despite the fact that no skin incisions were used, the area around the ureter and bladder is raw and irritated. The stent is a foreign body which will further irritate the bladder wall. This irritation is manifested by increased frequency of urination, both day and night, and by an increase in the urge to urinate. In some, the urge to urinate is present almost always. Sometimes the urge is strong enough that you may not be able to stop yourself from urinating. The only real cure is to remove the stent and then give time for the bladder wall to heal which can't be done until the danger of the ureter swelling shut has passed, which varies. ° °You may see some blood in your urine while the stent is in place and a few days afterwards. Do not be alarmed, even if the urine was clear for a while. Get off your feet and drink lots of fluids until clearing occurs. If you start to pass clots or don't improve, call us. ° °DIET: °You may return to your normal diet immediately. Because of the raw surface of your bladder, alcohol, spicy foods, acid type foods and drinks with caffeine may cause irritation or frequency and should be used in moderation. To keep your urine flowing freely and to avoid constipation, drink plenty of fluids during the day ( 8-10 glasses ). °Tip: Avoid cranberry juice because it is very  acidic. ° °ACTIVITY: °Your physical activity doesn't need to be restricted. However, if you are very active, you may see some blood in your urine. We suggest that you reduce your activity under these circumstances until the bleeding has stopped. ° °BOWELS: °It is important to keep your bowels regular during the postoperative period. Straining with bowel movements can cause bleeding. A bowel movement every other day is reasonable. Use a mild laxative if needed, such as Milk of Magnesia 2-3 tablespoons, or 2 Dulcolax tablets. Call if you continue to have problems. If you have been taking narcotics for pain, before, during or after your surgery, you may be constipated. Take a laxative if necessary. ° ° °MEDICATION: °You should resume your pre-surgery medications unless told not to. In addition you will often be given an antibiotic to prevent infection. These should be taken as prescribed until the bottles are finished unless you are having an unusual reaction to one of the drugs. ° °PROBLEMS YOU SHOULD REPORT TO US: °· Fevers over 100.5 Fahrenheit. °· Heavy bleeding, or clots ( See above notes about blood in urine ). °· Inability to urinate. °· Drug reactions ( hives, rash, nausea, vomiting, diarrhea ). °· Severe burning or pain with urination that is not improving. ° °FOLLOW-UP: °You will need a follow-up appointment to monitor your progress. Call for this appointment at the number listed above.   Usually the first appointment will be about three to fourteen days after your surgery. ° ° ° ° ° °Post Anesthesia Home Care Instructions ° °Activity: °Get plenty of rest for the remainder of the day. A responsible adult should stay with you for 24 hours following the procedure.  °For the next 24 hours, DO NOT: °-Drive a car °-Operate machinery °-Drink alcoholic beverages °-Take any medication unless instructed by your physician °-Make any legal decisions or sign important papers. ° °Meals: °Start with liquid foods such as  gelatin or soup. Progress to regular foods as tolerated. Avoid greasy, spicy, heavy foods. If nausea and/or vomiting occur, drink only clear liquids until the nausea and/or vomiting subsides. Call your physician if vomiting continues. ° °Special Instructions/Symptoms: °Your throat may feel dry or sore from the anesthesia or the breathing tube placed in your throat during surgery. If this causes discomfort, gargle with warm salt water. The discomfort should disappear within 24 hours. ° °If you had a scopolamine patch placed behind your ear for the management of post- operative nausea and/or vomiting: ° °1. The medication in the patch is effective for 72 hours, after which it should be removed.  Wrap patch in a tissue and discard in the trash. Wash hands thoroughly with soap and water. °2. You may remove the patch earlier than 72 hours if you experience unpleasant side effects which may include dry mouth, dizziness or visual disturbances. °3. Avoid touching the patch. Wash your hands with soap and water after contact with the patch. °  ° °

## 2015-09-27 NOTE — Anesthesia Postprocedure Evaluation (Signed)
Anesthesia Post Note  Patient: [name] [name]  Procedure(s) Performed: Procedure(s) (LRB): 2ND STAGE CYSTOSCOPY RIGHT URETEROSCOPY STENT REPLACEMENT (Right) HOLMIUM LASER (Right)  Patient location during evaluation: PACU Anesthesia Type: General Level of consciousness: awake and alert Pain management: pain level controlled Vital Signs Assessment: post-procedure vital signs reviewed and stable Respiratory status: spontaneous breathing, nonlabored ventilation, respiratory function stable and patient connected to nasal cannula oxygen Cardiovascular status: blood pressure returned to baseline and stable Postop Assessment: no signs of nausea or vomiting Anesthetic complications: no    Last Vitals:  Filed Vitals:   09/27/15 1230 09/27/15 1245  BP: 141/84 140/82  Pulse: 70 65  Temp:    Resp: 12 13    Last Pain:  Filed Vitals:   09/27/15 1246  PainSc: Asleep                 Alexis Frock

## 2015-09-30 ENCOUNTER — Encounter (HOSPITAL_BASED_OUTPATIENT_CLINIC_OR_DEPARTMENT_OTHER): Payer: Self-pay | Admitting: Urology

## 2015-09-30 NOTE — Op Note (Signed)
NAMEDMETRIUS, BARNFIELD NO.:  [id number]  MEDICAL RECORD NO.:  [id number]  LOCATION:                                 FACILITY:  PHYSICIAN:  Alexis Frock, MD     DATE OF BIRTH:  [birth year]  DATE OF PROCEDURE: 09/27/2015                              OPERATIVE REPORT  PREOPERATIVE DIAGNOSIS:  Large volume right-sided nephrolithiasis.  PROCEDURE: 1. Cystoscopy with right retrograde pyelogram interpretation. 2. Exchange of right ureteral stent, 5 x 26 Polaris, no tether. 3. Second stage right ureteroscopy with laser lithotripsy.  ESTIMATED BLOOD LOSS:  Nil.  COMPLICATION:  None.  SPECIMEN:  Numerous renal stone fragments for compositional analysis.  FINDINGS: 1. Residual large volume, mostly fragmented right renal stone.     Estimated volume today approximately 1.2 cm. 2. Complete resolution of all stone fragments larger than 1/3rd mm     following basket extraction and laser lithotripsy. 3. Successful replacement of right ureteral stent, proximal in renal     pelvis, distal in urinary bladder.  INDICATION:  Mr. Steuerwald is very pleasant 55 year old gentleman, who was found on workup of hematuria to have a large right intrarenal stone, almost 3 cm.  Options were discussed for management including staged ureteroscopy versus percutaneous approach as he had minimal intrarenal hydronephrosis, we both agreed on staged ureteroscopy.  He underwent first-staged procedure several weeks ago at which the majority of the stone was addressed.  He has had interval stenting to allow for stone fragment passage and presents today for second-staged procedure.  He has had no interval fevers.  Informed consent was obtained and placed in medical record.  PROCEDURE IN DETAIL:  The patient being Gerrit Schreiner verified. Procedure being right second-staged ureteroscopic stone manipulation was confirmed.  Procedure was carried out.  Time-out was performed. Intravenous  antibiotics were administered.  General LMA anesthesia was introduced.  Patient was placed into a low lithotomy position and sterile field was created by prepping and draping the patient's penis, perineum, proximal thighs using iodine x3.  Next, cystourethroscopy was performed using a 23-French rigid cystoscope with 30-degree offset lens. Inspection of the anterior and posterior urethra were unremarkable. Inspection of urinary bladder revealed multifocal, very small volume stone fragments which were irrigated.  The distal end of stent was seen in situ.  This was grasped, brought to the level of the urethral meatus, through which a 0.038 Zip wire was advanced at the level of the upper pole and exchanged for a 5-French open-ended catheter and right retrograde pyelogram was obtained.  Right retrograde pyelogram demonstrated a single right ureter with single-system right kidney.  There was some small volume residual filling defect in lower pole consistent likely with residual stone.  The zip wire was once again advanced, set aside as a safety wire over the proximal and the upper pole.  An 8-French feeding tube placed in urinary bladder for pressure release.  Next, semi-rigid ureteroscopy from the distal 2/3rds of the right ureter alongside a separate Sensor working wire.  No mucosal abnormalities were found.  Several small fragments of stone were encountered and basketed with an Escape basket, removed. Next, the semi-rigid scope was exchanged for a 12/14, 38 cm ureteral access sheath  at the level of proximal ureter taking great care not to advance the sheath to the level that had not been visualized.  Next, flexible digital ureteroscopy was performed allowing inspection of the proximal ureter and systematic inspection of the kidney including all calices x3.  There was multifocal small fragment, the large volume stone within the proximal ureter, this was grasped and removed sequentially using  combination of the Encompass and Escape basket.  Additionally in the renal pelvis, there was multifocal stone that estimated a total volume of stone which is approximately 1.2 cm.  This was very carefully sequentially grasped, using again a combination of escape basket and Encompass basket, removing all stones larger than approximately 1 mm. There is still significant volume of very, very small fragments mostly in the lower pole.  These suggest may be they would not easily basket as such holmium laser energy applied to this using settings of 1 joule and 15 hertz using a popcorn technique fragmenting the stone into essentially stone dust.  Following these maneuvers, there was complete resolution of all stone fragments larger than 1/3rd mm with excellent hemostasis.  No evidence of perforation.  The access sheath was removed under continuous vision.  No mucosal abnormalities were found.  Given the much smaller volume of stone dust, it was felt that interval stenting would be warranted without a tether as such a new 5 x 26 Polaris-type stent was placed with remaining safety wire using cystoscopic and fluoroscopic guidance.  Good proximal supplying were noted.  Bladder was partially emptied per cystoscope.  Procedure was then terminated.  The patient tolerated procedure well.  No immediate periprocedural complications.  Patient was taken to postanesthesia care unit in stable condition.          ______________________________ Alexis Frock, MD     TM/MEDQ  D:  09/27/2015  T:  09/27/2015  Job:  [id number]

## 2016-03-13 ENCOUNTER — Other Ambulatory Visit: Payer: Self-pay | Admitting: Family Medicine

## 2016-04-07 ENCOUNTER — Ambulatory Visit (INDEPENDENT_AMBULATORY_CARE_PROVIDER_SITE_OTHER): Payer: Managed Care, Other (non HMO) | Admitting: Family Medicine

## 2016-04-07 ENCOUNTER — Encounter: Payer: Self-pay | Admitting: Family Medicine

## 2016-04-07 VITALS — BP 144/78 | HR 82 | Temp 98.2°F | Wt 184.6 lb

## 2016-04-07 DIAGNOSIS — Z23 Encounter for immunization: Secondary | ICD-10-CM | POA: Diagnosis not present

## 2016-04-07 DIAGNOSIS — S61011A Laceration without foreign body of right thumb without damage to nail, initial encounter: Secondary | ICD-10-CM | POA: Diagnosis not present

## 2016-04-07 NOTE — Progress Notes (Signed)
Pre visit review using our clinic review tool, if applicable. No additional management support is needed unless otherwise documented below in the visit note. 

## 2016-04-07 NOTE — Progress Notes (Signed)
Subjective:  [name] [name] is a 55 y.o. year old very pleasant male patient who presents for/with See problem oriented charting ROS- no fever, chills, expanding redness, nausea,worsening pain.see any ROS included in HPI as well.   Past Medical History-  Patient Active Problem List   Diagnosis Date Noted  . Tobacco abuse 08/13/2014    Priority: High  . Hyperlipidemia 05/26/2010    Priority: Medium  . Gout 05/26/2010    Priority: Medium  . Essential hypertension 05/26/2010    Priority: Medium  . History of adenomatous polyp of colon 06/03/2015    Priority: Low  . Allergic rhinitis 08/13/2014    Priority: Low  . S/P shoulder replacement 08/17/2013    Priority: Low  . Acne rosacea 05/05/2013    Priority: Low    Medications- reviewed and updated Current Outpatient Prescriptions  Medication Sig Dispense Refill  . amLODipine (NORVASC) 5 MG tablet TAKE 1 TABLET (5 MG TOTAL) BY MOUTH DAILY. 90 tablet 1  . atorvastatin (LIPITOR) 20 MG tablet TAKE 1 TABLET BY MOUTH EVERY DAY 90 tablet 2  . fluticasone (FLONASE) 50 MCG/ACT nasal spray Place 1 spray into both nostrils daily as needed.     Marland Kitchen ibuprofen (ADVIL,MOTRIN) 600 MG tablet Take by mouth every 6 (six) hours as needed.      No current facility-administered medications for this visit.     Objective: BP (!) 144/78 (BP Location: Left Arm, Patient Position: Sitting, Cuff Size: Large)   Pulse 82   Temp 98.2 F (36.8 C) (Oral)   Wt 184 lb 9.6 oz (83.7 kg)   SpO2 96%   BMI 27.26 kg/m  Gen: NAD, resting comfortably Ext: mild edema in thumb, right thumb with 11 mm laceration extending to bed of nail but not into nail bed. Approximates very closely. About 2 mm depth. Still slightly oozing with blood Skin: warm, dry, no surrounding rash Neuro: good thumb movement and intact distal sensation to wound  Assessment/Plan:  Laceration of right thumb without foreign body without damage to nail, initial encounter S: about 3 hours prior  to visit was removing a stove front when a spring loaded hinge struck his right thumb. Immediate blood and pain was noted. He did not wash the area off but covered with pressure and bleeding slow. Mild to moderate aching pain. Spared the nail fortunately. Goes just above the skin over nail but not to the nail. He was tempted not even to come but family member strongly encouraged him. Wound 11 mm and approximates very well. 1-2 mm depth.  A/P: We extensively irrigated with tap water and cleansed near wound. Laceration repair was then performed after drying with dermabond. We considered sutures but with lack of depth, good approximation thought patient could do well with dermabond. Even considered steri strips given excellent wound approximation. Also updated tetanus shot as loast in 2011 (over 5 years so updated)  Blood pressure noted up slightly but in acute pain. Continue amlodipine alone an recheck next visit.   BP Readings from Last 3 Encounters:  04/07/16 (!) 144/78  09/27/15 132/86  09/13/15 (!) 145/93    Orders Placed This Encounter  Procedures  . Flu Vaccine QUAD 36+ mos IM  . Td : Tetanus/diphtheria >7yo Preservative  free   Return precautions advised.  Garret Reddish, MD

## 2016-04-07 NOTE — Patient Instructions (Signed)
Updated tetanus shot. Gave flu shot.   This should heal within about 2 weeks.   If you were to have recurrent bleeding not controlled with pressure for more than 10 minutes need to return to care. If you have expanding redness near wound site or fever, return to care  Blood pressure high but you were in pain

## 2016-04-09 ENCOUNTER — Ambulatory Visit (INDEPENDENT_AMBULATORY_CARE_PROVIDER_SITE_OTHER): Payer: Managed Care, Other (non HMO) | Admitting: Family Medicine

## 2016-04-09 ENCOUNTER — Encounter: Payer: Self-pay | Admitting: Family Medicine

## 2016-04-09 VITALS — BP 132/76 | HR 74 | Temp 98.1°F | Wt 183.8 lb

## 2016-04-09 DIAGNOSIS — S61011A Laceration without foreign body of right thumb without damage to nail, initial encounter: Secondary | ICD-10-CM

## 2016-04-09 MED ORDER — CEPHALEXIN 500 MG PO CAPS: 500.0000 mg | ORAL_CAPSULE | Freq: Three times a day (TID) | ORAL | 0 refills | Status: DC

## 2016-04-09 NOTE — Progress Notes (Signed)
Subjective:  Ramonte Fecteau is a 55 y.o. year old very pleasant male patient who presents for/with See problem oriented charting ROS- no fever, chills, nausea, expanding redness.see any ROS included in HPI as well.   Past Medical History-  Patient Active Problem List   Diagnosis Date Noted  . Tobacco abuse 08/13/2014    Priority: High  . Hyperlipidemia 05/26/2010    Priority: Medium  . Gout 05/26/2010    Priority: Medium  . Essential hypertension 05/26/2010    Priority: Medium  . History of adenomatous polyp of colon 06/03/2015    Priority: Low  . Allergic rhinitis 08/13/2014    Priority: Low  . S/P shoulder replacement 08/17/2013    Priority: Low  . Acne rosacea 05/05/2013    Priority: Low    Medications- reviewed and updated Current Outpatient Prescriptions  Medication Sig Dispense Refill  . amLODipine (NORVASC) 5 MG tablet TAKE 1 TABLET (5 MG TOTAL) BY MOUTH DAILY. 90 tablet 1  . atorvastatin (LIPITOR) 20 MG tablet TAKE 1 TABLET BY MOUTH EVERY DAY 90 tablet 2  . fluticasone (FLONASE) 50 MCG/ACT nasal spray Place 1 spray into both nostrils daily as needed.     Marland Kitchen ibuprofen (ADVIL,MOTRIN) 600 MG tablet Take by mouth every 6 (six) hours as needed.     . cephALEXin (KEFLEX) 500 MG capsule Take 1 capsule (500 mg total) by mouth 3 (three) times daily. 21 capsule 0   No current facility-administered medications for this visit.     Objective: BP 132/76 (BP Location: Left Arm, Patient Position: Sitting, Cuff Size: Large)   Pulse 74   Temp 98.1 F (36.7 C) (Oral)   Wt 183 lb 12.8 oz (83.4 kg)   SpO2 97%   BMI 27.14 kg/m  Gen: NAD, resting comfortably Regular heart rate nonlabored breathing 11 cm wound on right hand with about 1-41mm separation though prior depth of 2-3 mm no longer noted- appears to be healing by secondary intention  Assessment/Plan:  Laceration of right thumb without foreign body without damage to nail, initial encounter S: Patient seen  2 days ago  with 11 mm laceration on right thumb about 3 mm deep. We used dermabond to close wound. While we did cover that day per his strong request- we discussed risks of this affecting the dermabond. When he removed dressing this Am he noted that the dermabond pulled off with it or had already removed overnight. Has slight separation of wound- but depth of wound has already improved A/P: Separation of wound without obvious infection. High risk area- we will cover with keflex given the separation and how active patient is with his hands. Return precautions given.   Meds ordered this encounter  Medications  . cephALEXin (KEFLEX) 500 MG capsule    Sig: Take 1 capsule (500 mg total) by mouth 3 (three) times daily.    Dispense:  21 capsule    Refill:  0    Return precautions advised.  Garret Reddish, MD

## 2016-04-09 NOTE — Progress Notes (Signed)
Pre visit review using our clinic review tool, if applicable. No additional management support is needed unless otherwise documented below in the visit note. 

## 2016-04-09 NOTE — Patient Instructions (Signed)
Since wound pulled apart- let's cover with keflex since higher risk area on hand for infection.   Does appear to be healing well fortunately  Use steri strips daily until you run out- may be able to cut strips in half to extend them a few more days. Then cover again- wash each time you take dressing off daily. No antibiotic cream since using oral antibiotic

## 2016-06-08 ENCOUNTER — Other Ambulatory Visit: Payer: Self-pay | Admitting: Family Medicine

## 2016-09-06 ENCOUNTER — Other Ambulatory Visit: Payer: Self-pay | Admitting: Family Medicine

## 2017-01-16 ENCOUNTER — Other Ambulatory Visit: Payer: Self-pay | Admitting: Family Medicine

## 2017-03-03 ENCOUNTER — Other Ambulatory Visit: Payer: Self-pay | Admitting: Family Medicine

## 2017-07-08 ENCOUNTER — Other Ambulatory Visit: Payer: Self-pay | Admitting: Family Medicine

## 2017-08-10 LAB — PSA: PSA: 0.18

## 2017-09-02 ENCOUNTER — Encounter: Payer: Self-pay | Admitting: Family Medicine

## 2017-09-29 ENCOUNTER — Other Ambulatory Visit: Payer: Self-pay

## 2017-09-29 MED ORDER — AMLODIPINE BESYLATE 5 MG PO TABS: 5.0000 mg | ORAL_TABLET | Freq: Every day | ORAL | 1 refills | Status: DC

## 2017-11-30 ENCOUNTER — Encounter: Payer: Self-pay | Admitting: Family Medicine

## 2017-11-30 ENCOUNTER — Ambulatory Visit (INDEPENDENT_AMBULATORY_CARE_PROVIDER_SITE_OTHER): Payer: 59 | Admitting: Family Medicine

## 2017-11-30 VITALS — BP 130/82 | HR 80 | Temp 97.8°F | Ht 69.0 in | Wt 184.0 lb

## 2017-11-30 DIAGNOSIS — E785 Hyperlipidemia, unspecified: Secondary | ICD-10-CM | POA: Diagnosis not present

## 2017-11-30 DIAGNOSIS — I10 Essential (primary) hypertension: Secondary | ICD-10-CM

## 2017-11-30 DIAGNOSIS — Z720 Tobacco use: Secondary | ICD-10-CM | POA: Diagnosis not present

## 2017-11-30 DIAGNOSIS — Z0000 Encounter for general adult medical examination without abnormal findings: Secondary | ICD-10-CM

## 2017-11-30 DIAGNOSIS — M1A09X0 Idiopathic chronic gout, multiple sites, without tophus (tophi): Secondary | ICD-10-CM | POA: Diagnosis not present

## 2017-11-30 LAB — COMPREHENSIVE METABOLIC PANEL
ALBUMIN: 4.5 g/dL (ref 3.5–5.2)
ALT: 45 U/L (ref 0–53)
AST: 31 U/L (ref 0–37)
Alkaline Phosphatase: 73 U/L (ref 39–117)
BUN: 16 mg/dL (ref 6–23)
CO2: 26 mEq/L (ref 19–32)
Calcium: 9.8 mg/dL (ref 8.4–10.5)
Chloride: 101 mEq/L (ref 96–112)
Creatinine, Ser: 1.17 mg/dL (ref 0.40–1.50)
GFR: 68.71 mL/min (ref >60.00)
GLUCOSE: 91 mg/dL (ref 70–99)
POTASSIUM: 4.2 meq/L (ref 3.5–5.1)
Sodium: 138 mEq/L (ref 135–145)
Total Bilirubin: 0.7 mg/dL (ref 0.2–1.2)
Total Protein: 7.2 g/dL (ref 6.0–8.3)

## 2017-11-30 LAB — CBC
HCT: 46 % (ref 39.0–52.0)
HEMOGLOBIN: 15.5 g/dL (ref 13.0–17.0)
MCHC: 33.7 g/dL (ref 30.0–36.0)
MCV: 88 fl (ref 78.0–100.0)
PLATELETS: 245 10*3/uL (ref 150.0–400.0)
RBC: 5.23 Mil/uL (ref 4.22–5.81)
RDW: 13.5 % (ref 11.5–15.5)
WBC: 12.1 10*3/uL — AB (ref 4.0–10.5)

## 2017-11-30 LAB — LDL CHOLESTEROL, DIRECT: Direct LDL: 85 mg/dL

## 2017-11-30 LAB — LIPID PANEL
CHOL/HDL RATIO: 4
Cholesterol: 198 mg/dL (ref 0–200)
HDL: 53.6 mg/dL (ref >39.00)
Triglycerides: 410 mg/dL — ABNORMAL HIGH (ref 0.0–149.0)

## 2017-11-30 NOTE — Assessment & Plan Note (Signed)
Gout - no recent flares

## 2017-11-30 NOTE — Addendum Note (Signed)
Addended by: Frutoso Chase A on: 11/30/2017 02:09 PM   Modules accepted: Orders

## 2017-11-30 NOTE — Patient Instructions (Addendum)
Please check with your pharmacy to see if they have the shingrix vaccine. If they do- please get this immunization and update [place] by phone call or mychart with dates you receive the vaccine  We will call you within two weeks about your referral to lung cancer screening program. If you do not hear within 3 weeks, give [place] a call.   If spot on outer knee enlarges or causes you more pain- let me refer you to Dr. Paulla Fore of sports medicine to have him take a look  Please stop by lab before you go

## 2017-11-30 NOTE — Progress Notes (Signed)
Please let us know by responding to this when you get this mychart message and let us know if you understand the results/directions  Your CBC was largely normal (blood counts, infection fighting cells, platelets) except for slightly high white blood cells. Rest of your cell lines look ok- 2 optiosn here- either recheck in a year with labs or come back in a month or so for repeat CBC under elevated white blood cell count/leukocytosis Your CMET was normal (kidney, liver, and electrolytes, blood sugar).  Your cholesterol was ok for bad cholesterol but your triglycerides were far too high- recommend healthy eating, regular exercise, weight loss and recheck next year. Ive got a handout on lowering triglycerides we can send him if he would like or he can access it online --> [email or web address]

## 2017-11-30 NOTE — Assessment & Plan Note (Signed)
HLD- on atorvastatin 20mg  . Update lipids- fasting this late in afternoon

## 2017-11-30 NOTE — Assessment & Plan Note (Signed)
HTN- controlled on amlodipine 5 mg. Need to see him at least yearly as long as home BPs <140/90 which they have been

## 2017-11-30 NOTE — Progress Notes (Signed)
Phone: [phone]  Subjective:  Patient presents today for their annual physical. Chief complaint-noted.   See problem oriented charting- ROS- full  review of systems was completed and negative including No chest pain or shortness of breath. No headache or blurry vision.   The following were reviewed and entered/updated in epic: Past Medical History:  Diagnosis Date  . Arthritis   . ED (erectile dysfunction)   . History of adenomatous polyp of colon    05-27-2015  . History of gout    no issue since 2012  . Hyperlipidemia   . Hypertension   . Microhematuria   . Raynaud's syndrome   . Renal calculus, right   . Testicular hypogonadism   . Wears glasses    Patient Active Problem List   Diagnosis Date Noted  . Tobacco abuse 08/13/2014    Priority: High  . Hyperlipidemia 05/26/2010    Priority: Medium  . Gout 05/26/2010    Priority: Medium  . Essential hypertension 05/26/2010    Priority: Medium  . History of adenomatous polyp of colon 06/03/2015    Priority: Low  . Allergic rhinitis 08/13/2014    Priority: Low  . S/P shoulder replacement 08/17/2013    Priority: Low  . Acne rosacea 05/05/2013    Priority: Low   Past Surgical History:  Procedure Laterality Date  . BICEPS TENDON REPAIR Right 2003  . COLONOSCOPY  05-27-2015  . CYSTOSCOPY WITH URETEROSCOPY, STONE BASKETRY AND STENT PLACEMENT Right 09/27/2015   Procedure: 2ND STAGE CYSTOSCOPY RIGHT URETEROSCOPY STENT REPLACEMENT;  Surgeon: Alexis Frock, MD;  Location: [facility];  Service: Urology;  Laterality: Right;  . CYSTOSCOPY/URETEROSCOPY/HOLMIUM LASER/STENT PLACEMENT Right 09/13/2015   Procedure: 1ST STAGE - CYSTOSCOPY/URETEROSCOPY/HOLMIUM LASER/STENT PLACEMENT;  Surgeon: Alexis Frock, MD;  Location: [facility];  Service: Urology;  Laterality: Right;  . HOLMIUM LASER APPLICATION Right 10/15/8784   Procedure: HOLMIUM LASER APPLICATION;  Surgeon: Alexis Frock, MD;  Location:  [facility];  Service: Urology;  Laterality: Right;  . HOLMIUM LASER APPLICATION Right 7/67/2094   Procedure: HOLMIUM LASER;  Surgeon: Alexis Frock, MD;  Location: [facility];  Service: Urology;  Laterality: Right;  . TOTAL SHOULDER ARTHROPLASTY Right 08/17/2013   RIGHT TOTAL SHOULDER ARTHROPLASTY;  Surgeon: [name], MD;  Location: [place];  Service: Orthopedics;  Laterality: Right;    Family History  Problem Relation Age of Onset  . Coronary artery disease Father        CABG 3, former smoker  . Diabetes Father   . Hypertension Father   . Hyperlipidemia Mother        trig  . Hypertension Sister        HLD  . Colon cancer Neg Hx   . Esophageal cancer Neg Hx   . Rectal cancer Neg Hx   . Stomach cancer Neg Hx   . Prostate cancer Neg Hx     Medications- reviewed and updated Current Outpatient Medications  Medication Sig Dispense Refill  . amLODipine (NORVASC) 5 MG tablet Take 1 tablet (5 mg total) by mouth daily. 90 tablet 1  . atorvastatin (LIPITOR) 20 MG tablet TAKE 1 TABLET EVERY DAY 90 tablet 2  . fluticasone (FLONASE) 50 MCG/ACT nasal spray Place 1 spray into both nostrils daily as needed.      No current facility-administered medications for this visit.     Allergies-reviewed and updated Allergies  Allergen Reactions  . Lisinopril Cough    Social History   Social  History Narrative   Married. Son and daughter (27 and 53 in 2016). No grandkids.       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 Oropharynx normal Neck: no  thyromegaly CV: RRR no murmurs rubs or gallops Lungs: CTAB no crackles, wheeze, rhonchi Abdomen: soft/nontender/nondistended/normal bowel sounds. No rebound or guarding.  Ext: no edema Skin: warm, dry Neuro: grossly normal, moves all extremities, PERRLA MSK: mobile lesion over left lateral knee- some softness to it (could be cyst)  Assessment/Plan:  57 y.o. male presenting for annual physical.  Health Maintenance counseling: 1. Anticipatory guidance: Patient counseled regarding regular dental exams -q6 months, eye exams -yearly (needs update), wearing seatbelts.  2. Risk factor reduction:  Advised patient of need for regular exercise and diet rich and fruits and vegetables to reduce risk of heart attack and stroke. Exercise- average 2x a week, thinking as travel is cut down should get better. Diet-BMI under 27.5 reasonable- he wants to potentially add some muscle mass.  Wt Readings from Last 3 Encounters:  11/30/17 184 lb (83.5 kg)  04/09/16 183 lb 12.8 oz (83.4 kg)  04/07/16 184 lb 9.6 oz (83.7 kg)  3. Immunizations/screenings/ancillary studies- - We discussed shingrix availability issues - I recommended that patient get vaccine at the pharmacy  Immunization History  Administered Date(s) Administered  . Influenza Whole 05/26/2010  . Influenza,inj,Quad PF,6+ Mos 04/07/2016  . Pneumococcal Polysaccharide-23 08/18/2013  . Td 05/26/2010, 04/07/2016  4. Prostate cancer screening- was checked by Dr. Tresa Moore as part of urological eval for kidney stones  Lab Results  Component Value Date   PSA 0.18 08/10/2017   PSA 0.14 04/23/2015   5. Colon cancer screening - 05/2015 with 5 year follow up due to adenoma 6. Skin cancer screening- [facility] dermatology- sees [place]. advised regular sunscreen use. Denies worrisome, changing, or new skin lesions.  7. Smoker- see below  Status of chronic or acute concerns   Asks about Low T- Still has libido and still getting morning erections- we discussed  testing only if these dropped off. Gets sildenafil through Dr. Tresa Moore  Essential hypertension HTN- controlled on amlodipine 5 mg. Need to see him at least yearly as long as home BPs <140/90 which they have been  Gout Gout - no recent flares  Hyperlipidemia HLD- on atorvastatin 20mg  . Update lipids- fasting this late in afternoon  Tobacco abuse Smoker- Pack per day smoking for 25 years then in early 70s cut down 1/2 PPD for 10 years at least 10 years.  1 pack per 2 weeks last visit- even lower now. - refer to lung cancer screening program - discussed AAA screen at age 36 - UA's done with urology yearly -strongly encouraged quitting smoking- he is not ready   Return in about 1 year (around 12/01/2018) for physical.  Lab/Order associations: Preventative health care - Plan: Lipid panel, CBC, Comprehensive metabolic panel, Ambulatory Referral for Lung Cancer Scre  Hyperlipidemia, unspecified hyperlipidemia type - Plan: Lipid panel, CBC, Comprehensive metabolic panel  Tobacco abuse -  Plan: Ambulatory Referral for Lung Cancer Scre  Return precautions advised.  Garret Reddish, MD

## 2017-11-30 NOTE — Assessment & Plan Note (Addendum)
Smoker- Pack per day smoking for 25 years then in early 57s cut down 1/2 PPD for 10 years at least 10 years.  1 pack per 2 weeks last visit- even lower now. - refer to lung cancer screening program - discussed AAA screen at age 57 - UA's done with urology yearly -strongly encouraged quitting smoking- he is not ready

## 2017-12-06 ENCOUNTER — Other Ambulatory Visit: Payer: Self-pay | Admitting: Acute Care

## 2017-12-06 DIAGNOSIS — Z122 Encounter for screening for malignant neoplasm of respiratory organs: Secondary | ICD-10-CM

## 2017-12-06 DIAGNOSIS — F17210 Nicotine dependence, cigarettes, uncomplicated: Secondary | ICD-10-CM

## 2017-12-07 ENCOUNTER — Other Ambulatory Visit: Payer: Self-pay

## 2017-12-07 MED ORDER — ATORVASTATIN CALCIUM 20 MG PO TABS: 20.0000 mg | ORAL_TABLET | Freq: Every day | ORAL | 2 refills | Status: DC

## 2017-12-15 ENCOUNTER — Ambulatory Visit (INDEPENDENT_AMBULATORY_CARE_PROVIDER_SITE_OTHER): Payer: 59 | Admitting: Acute Care

## 2017-12-15 ENCOUNTER — Telehealth: Payer: Self-pay | Admitting: Acute Care

## 2017-12-15 ENCOUNTER — Encounter: Payer: Self-pay | Admitting: Acute Care

## 2017-12-15 ENCOUNTER — Ambulatory Visit (INDEPENDENT_AMBULATORY_CARE_PROVIDER_SITE_OTHER)
Admission: RE | Admit: 2017-12-15 | Discharge: 2017-12-15 | Disposition: A | Discharge status: Home / Self Care | Payer: 59 | Source: Ambulatory Visit | Attending: Acute Care | Admitting: Acute Care

## 2017-12-15 DIAGNOSIS — F17210 Nicotine dependence, cigarettes, uncomplicated: Secondary | ICD-10-CM

## 2017-12-15 DIAGNOSIS — Z122 Encounter for screening for malignant neoplasm of respiratory organs: Secondary | ICD-10-CM

## 2017-12-15 NOTE — Progress Notes (Signed)
Shared Decision Making Visit Lung Cancer Screening Program [phone])   Eligibility:  Age 58 y.o.  Pack Years Smoking History Calculation 40 pack year (# packs/per year x # years smoked)  Recent History of coughing up blood  no  Unexplained weight loss? no ( >Than 15 pounds within the last 6 months )  Prior History Lung / other cancer no (Diagnosis within the last 5 years already requiring surveillance chest CT Scans).  Smoking Status Current Smoker  Former Smokers: Years since quit: NA  Quit Date: NA  Visit Components:  Discussion included one or more decision making aids. yes  Discussion included risk/benefits of screening. yes  Discussion included potential follow up diagnostic testing for abnormal scans. yes  Discussion included meaning and risk of over diagnosis. yes  Discussion included meaning and risk of False Positives. yes  Discussion included meaning of total radiation exposure. yes  Counseling Included:  Importance of adherence to annual lung cancer LDCT screening. yes  Impact of comorbidities on ability to participate in the program. yes  Ability and willingness to under diagnostic treatment. yes  Smoking Cessation Counseling:  Current Smokers:   Discussed importance of smoking cessation. yes  Information about tobacco cessation classes and interventions provided to patient. yes  Patient provided with "ticket" for LDCT Scan. yes  Symptomatic Patient. no  Counseling  Diagnosis Code: Tobacco Use Z72.0  Asymptomatic Patient yes  Counseling (Intermediate counseling: > three minutes counseling) F0932  Former Smokers:   Discussed the importance of maintaining cigarette abstinence. yes  Diagnosis Code: Personal History of Nicotine Dependence. T55.732  Information about tobacco cessation classes and interventions provided to patient. Yes  Patient provided with "ticket" for LDCT Scan. yes  Written Order for Lung Cancer Screening with LDCT  placed in Epic. Yes (CT Chest Lung Cancer Screening Low Dose W/O CM) [id number] Z12.2-Screening of respiratory organs Z87.891-Personal history of nicotine dependence  I have spent 25 minutes of face to face time with Mr. Hassebrock discussing the risks and benefits of lung cancer screening. We viewed a power point together that explained in detail the above noted topics. We paused at intervals to allow for questions to be asked and answered to ensure understanding.We discussed that the single most powerful action that he can take to decrease his risk of developing lung cancer is to quit smoking. We discussed whether or not he is ready to commit to setting a quit date. We discussed options for tools to aid in quitting smoking including nicotine replacement therapy, non-nicotine medications, support groups, Quit Smart classes, and behavior modification. We discussed that often times setting smaller, more achievable goals, such as eliminating 1 cigarette a day for a week and then 2 cigarettes a day for a week can be helpful in slowly decreasing the number of cigarettes smoked. This allows for a sense of accomplishment as well as providing a clinical benefit. I gave him the " Be Stronger Than Your Excuses" card with contact information for community resources, classes, free nicotine replacement therapy, and access to mobile apps, text messaging, and on-line smoking cessation help. I have also given him my card and contact information in the event he needs to contact me. We discussed the time and location of the scan, and that either Doroteo Glassman RN or I will call with the results within 24-48 hours of receiving them. I have offered him  a copy of the power point we viewed  as a resource in the event they need reinforcement of the concepts  we discussed today in the office. The patient verbalized understanding of all of  the above and had no further questions upon leaving the office. They have my contact information  in the event they have any further questions.  I spent 4 minutes counseling on smoking cessation and the health risks of continued tobacco abuse.  I explained to the patient that there has been a high incidence of coronary artery disease noted on these exams. I explained that this is a non-gated exam therefore degree or severity cannot be determined. This patient is on statin therapy. I have asked the patient to follow-up with their PCP regarding any incidental finding of coronary artery disease and management with diet or medication as their PCP  feels is clinically indicated. The patient verbalized understanding of the above and had no further questions upon completion of the visit.      Magdalen Spatz, NP 12/15/2017 10:12 AM

## 2017-12-21 NOTE — Telephone Encounter (Signed)
Called and spoke with pt stating to him I was going to send message to [place], [place] for her to go over results with him.  Pt expressed understanding. Routing to [place].

## 2017-12-21 NOTE — Telephone Encounter (Signed)
Pt informed of CT results per Sarah Groce, NP.  PT verbalized understanding.  Copy sent to PCP.  Order placed for 1 yr f/u CT.  

## 2018-04-04 ENCOUNTER — Other Ambulatory Visit: Payer: Self-pay | Admitting: Family Medicine

## 2018-08-26 ENCOUNTER — Other Ambulatory Visit: Payer: Self-pay | Admitting: Family Medicine

## 2018-09-30 ENCOUNTER — Other Ambulatory Visit: Payer: Self-pay | Admitting: Family Medicine

## 2018-09-30 ENCOUNTER — Other Ambulatory Visit: Payer: Self-pay | Admitting: Adult Health

## 2018-09-30 MED ORDER — GABAPENTIN 300 MG PO CAPS: 300.0000 mg | ORAL_CAPSULE | Freq: Every day | ORAL | 0 refills | Status: DC

## 2018-09-30 MED ORDER — CYCLOBENZAPRINE HCL 5 MG PO TABS: 5.0000 mg | ORAL_TABLET | Freq: Three times a day (TID) | ORAL | 0 refills | Status: DC | PRN

## 2018-12-02 ENCOUNTER — Other Ambulatory Visit: Payer: Self-pay

## 2018-12-02 ENCOUNTER — Ambulatory Visit (INDEPENDENT_AMBULATORY_CARE_PROVIDER_SITE_OTHER): Payer: 59 | Admitting: Family Medicine

## 2018-12-02 ENCOUNTER — Encounter: Payer: Self-pay | Admitting: Family Medicine

## 2018-12-02 VITALS — BP 135/88 | HR 66 | Temp 98.0°F | Ht 69.5 in | Wt 179.6 lb

## 2018-12-02 DIAGNOSIS — Z125 Encounter for screening for malignant neoplasm of prostate: Secondary | ICD-10-CM | POA: Diagnosis not present

## 2018-12-02 DIAGNOSIS — E663 Overweight: Secondary | ICD-10-CM | POA: Diagnosis not present

## 2018-12-02 DIAGNOSIS — Z0000 Encounter for general adult medical examination without abnormal findings: Secondary | ICD-10-CM

## 2018-12-02 DIAGNOSIS — E785 Hyperlipidemia, unspecified: Secondary | ICD-10-CM

## 2018-12-02 LAB — CBC WITH DIFFERENTIAL/PLATELET
Basophils Absolute: 0.1 10*3/uL (ref 0.0–0.1)
Basophils Relative: 1.3 % (ref 0.0–3.0)
Eosinophils Absolute: 0.4 10*3/uL (ref 0.0–0.7)
Eosinophils Relative: 4.2 % (ref 0.0–5.0)
HCT: 46.8 % (ref 39.0–52.0)
Hemoglobin: 15.6 g/dL (ref 13.0–17.0)
Lymphocytes Relative: 18 % (ref 12.0–46.0)
Lymphs Abs: 1.8 10*3/uL (ref 0.7–4.0)
MCHC: 33.3 g/dL (ref 30.0–36.0)
MCV: 89.1 fl (ref 78.0–100.0)
Monocytes Absolute: 0.6 10*3/uL (ref 0.1–1.0)
Monocytes Relative: 6.5 % (ref 3.0–12.0)
Neutro Abs: 7 10*3/uL (ref 1.4–7.7)
Neutrophils Relative %: 70 % (ref 43.0–77.0)
Platelets: 237 10*3/uL (ref 150.0–400.0)
RBC: 5.24 Mil/uL (ref 4.22–5.81)
RDW: 13.7 % (ref 11.5–15.5)
WBC: 9.9 10*3/uL (ref 4.0–10.5)

## 2018-12-02 LAB — COMPREHENSIVE METABOLIC PANEL
ALT: 33 U/L (ref 0–53)
AST: 29 U/L (ref 0–37)
Albumin: 4.3 g/dL (ref 3.5–5.2)
Alkaline Phosphatase: 76 U/L (ref 39–117)
BUN: 15 mg/dL (ref 6–23)
CO2: 25 mEq/L (ref 19–32)
Calcium: 9.4 mg/dL (ref 8.4–10.5)
Chloride: 104 mEq/L (ref 96–112)
Creatinine, Ser: 1.09 mg/dL (ref 0.40–1.50)
GFR: 69.89 mL/min (ref >60.00)
Glucose, Bld: 83 mg/dL (ref 70–99)
Potassium: 3.9 mEq/L (ref 3.5–5.1)
Sodium: 138 mEq/L (ref 135–145)
Total Bilirubin: 0.5 mg/dL (ref 0.2–1.2)
Total Protein: 7 g/dL (ref 6.0–8.3)

## 2018-12-02 LAB — LIPID PANEL
Cholesterol: 163 mg/dL (ref 0–200)
HDL: 64.4 mg/dL (ref >39.00)
LDL Cholesterol: 77 mg/dL (ref 0–99)
NonHDL: 98.97
Total CHOL/HDL Ratio: 3
Triglycerides: 108 mg/dL (ref 0.0–149.0)
VLDL: 21.6 mg/dL (ref 0.0–40.0)

## 2018-12-02 NOTE — Patient Instructions (Addendum)
Please stop by lab before you go If you do not have mychart- we will call you about results within 5 business days of [place] receiving them.  If you have mychart- we will send your results within 3 business days of [place] receiving them.  If abnormal or we want to clarify a result, we will call or mychart you to make sure you receive the message.  If you have questions or concerns or don't hear within 5-7 days, please send [place] a message or call us.   One of the best things you can do is quit smoking  Try to find some ways to exercise at home including perhaps body weight strength training

## 2018-12-02 NOTE — Progress Notes (Signed)
Phone: [phone]   Subjective:  Patient presents today for their annual physical. Chief complaint-noted.   See problem oriented charting- ROS- full  review of systems was completed and negative including No chest pain or shortness of breath. No headache or blurry vision.   The following were reviewed and entered/updated in epic:  Patient Active Problem List   Diagnosis Date Noted  . Tobacco abuse 08/13/2014    Priority: High  . Hyperlipidemia 05/26/2010    Priority: Medium  . Gout 05/26/2010    Priority: Medium  . Essential hypertension 05/26/2010    Priority: Medium  . History of adenomatous polyp of colon 06/03/2015    Priority: Low  . Allergic rhinitis 08/13/2014    Priority: Low  . S/P shoulder replacement 08/17/2013    Priority: Low  . Acne rosacea 05/05/2013    Priority: Low   Past Surgical History:  Procedure Laterality Date  . BICEPS TENDON REPAIR Right 2003  . COLONOSCOPY  05-27-2015  . CYSTOSCOPY WITH URETEROSCOPY, STONE BASKETRY AND STENT PLACEMENT Right 09/27/2015   Procedure: 2ND STAGE CYSTOSCOPY RIGHT URETEROSCOPY STENT REPLACEMENT;  Surgeon: Alexis Frock, MD;  Location: [facility];  Service: Urology;  Laterality: Right;  . CYSTOSCOPY/URETEROSCOPY/HOLMIUM LASER/STENT PLACEMENT Right 09/13/2015   Procedure: 1ST STAGE - CYSTOSCOPY/URETEROSCOPY/HOLMIUM LASER/STENT PLACEMENT;  Surgeon: Alexis Frock, MD;  Location: [facility];  Service: Urology;  Laterality: Right;  . HOLMIUM LASER APPLICATION Right 01/18/9677   Procedure: HOLMIUM LASER APPLICATION;  Surgeon: Alexis Frock, MD;  Location: [facility];  Service: Urology;  Laterality: Right;  . HOLMIUM LASER APPLICATION Right 9/38/1017   Procedure: HOLMIUM LASER;  Surgeon: Alexis Frock, MD;  Location: [facility];  Service: Urology;  Laterality: Right;  . TOTAL SHOULDER ARTHROPLASTY Right 08/17/2013   RIGHT TOTAL SHOULDER ARTHROPLASTY;  Surgeon:  [name], MD;  Location: [facility];  Service: Orthopedics;  Laterality: Right;    Family History  Problem Relation Age of Onset  . Coronary artery disease Father        CABG 1, former smoker  . Diabetes Father   . Hypertension Father   . Hyperlipidemia Mother        trig  . Hypertension Sister        HLD  . Colon cancer Neg Hx   . Esophageal cancer Neg Hx   . Rectal cancer Neg Hx   . Stomach cancer Neg Hx   . Prostate cancer Neg Hx     Medications- reviewed and updated Current Outpatient Medications  Medication Sig Dispense Refill  . amLODipine (NORVASC) 5 MG tablet TAKE 1 TABLET BY MOUTH EVERY DAY 90 tablet 0  . atorvastatin (LIPITOR) 20 MG tablet TAKE 1 TABLET BY MOUTH EVERY DAY 90 tablet 2  . cyclobenzaprine (FLEXERIL) 5 MG tablet Take 1 tablet (5 mg total) by mouth 3 (three) times daily as needed for muscle spasms. 21 tablet 0  . fluticasone (FLONASE) 50 MCG/ACT nasal spray Place 1 spray into both nostrils daily as needed.     . gabapentin (NEURONTIN) 300 MG capsule Take 1 capsule (300 mg total) by mouth at bedtime. 14 capsule 0  . amoxicillin (AMOXIL) 500 MG capsule TAKE 4 CAPSULES BY MOUTH 1 HOUR PRIOR TO DENTAL APPT     No current facility-administered medications for this visit.     Allergies-reviewed and updated Allergies  Allergen Reactions  . Lisinopril Cough    Social History   Social History Narrative   Married. Son  and daughter (32 and 2 in 2016). No grandkids.       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 No rebound or guarding.  Ext: no edema Skin: warm, dry Neuro: grossly normal, moves all extremities, PERRLA   Assessment and Plan  58 y.o. male presenting for annual physical.  Health Maintenance counseling: 1. Anticipatory guidance: Patient counseled regarding regular dental exams -q6 months, eye exams - q2yearly,  avoiding smoking and second hand smoke- he needs to quit smoking , limiting alcohol to 2 beverages per day (outside of covid 19).   2. Risk factor reduction:  Advised patient of need for regular exercise and diet rich and fruits and vegetables to reduce risk of heart attack and stroke. Exercise- had been exercising - a little lower with covid 19- discussed some home exercise options. Diet-has been trying to eat a healthier diet.  Down 5 lbs from last year! Thinks may have lost some muscle mass- wants to do some weight training Wt Readings from Last 3 Encounters:  12/02/18 179 lb 9.6 oz (81.5 kg)  11/30/17 184 lb (83.5 kg)  04/09/16 183 lb 12.8 oz (83.4 kg)  3. Immunizations/screenings/ancillary studies- holding off on shingrix in light of covid 19 pandemic Immunization History  Administered Date(s) Administered  . Influenza Whole 05/26/2010  . Influenza,inj,Quad PF,6+ Mos 04/07/2016  . Pneumococcal Polysaccharide-23 08/18/2013  . Td 05/26/2010, 04/07/2016   4. Prostate cancer screening-  low risk psa trend previously- and followed this year by Dr. Tresa Moore was 0.16- we will defer rectal and PSA since recently done Lab Results  Component Value Date   PSA 0.18 08/10/2017   PSA 0.14 04/23/2015   5. Colon cancer screening - 05/2015 with 5 year follow up due to adenomatous colon polyp 6. Skin cancer screening- [facility]- saw Coralyn Helling in past- likely going in fall again. advised regular sunscreen use. Denies worrisome, changing, or new skin lesions.  7.  Smoker- see below  Status of chronic or acute concerns   #Hypertension-  controlled on amlodipine 5 mg. A few home checks 130s/80s or 70s  #HLD- compliant with atorvastatin 20 mg- update lipids, cbc, cmp  #Gout- no recent flares   # allergic rhinitis- reasonably controlled on flonase OTC- prn  # tobacco -  over 30 pack years. Less than 1 pack per 2 weeks last year- about the same this year  -referred to lung cancer screening program last year - AAA screen planned at 63 - gets UAs with urology - strongly encouraged cessation of smoking  # gabapentin and flexeril sent in for about 2 weeks from neurology and that resolved symptoms  #sildenafil through Dr. Tresa Moore. He saw him in April and he told him he could come back in 1 year.   No problem-specific Assessment & Plan notes found for this encounter.   No future appointments. No follow-ups on file.  Lab/Order associations: fasting Preventative health care - Plan: Comprehensive metabolic panel, Lipid  panel, CBC with Differential/Platelet, CANCELED: PSA  Hyperlipidemia, unspecified hyperlipidemia type - Plan: Comprehensive metabolic panel, Lipid panel, CBC with Differential/Platelet  Screening for prostate cancer - Plan: CANCELED: PSA  No orders of the defined types were placed in this encounter.   Return precautions advised.  Garret Reddish, MD

## 2019-01-02 ENCOUNTER — Other Ambulatory Visit: Payer: Self-pay | Admitting: Family Medicine

## 2019-01-13 ENCOUNTER — Other Ambulatory Visit: Payer: Self-pay | Admitting: *Deleted

## 2019-01-13 DIAGNOSIS — F17210 Nicotine dependence, cigarettes, uncomplicated: Secondary | ICD-10-CM

## 2019-01-13 DIAGNOSIS — Z122 Encounter for screening for malignant neoplasm of respiratory organs: Secondary | ICD-10-CM

## 2019-02-27 ENCOUNTER — Ambulatory Visit (INDEPENDENT_AMBULATORY_CARE_PROVIDER_SITE_OTHER)
Admission: RE | Admit: 2019-02-27 | Discharge: 2019-02-27 | Disposition: A | Discharge status: Home / Self Care | Payer: 59 | Source: Ambulatory Visit | Attending: Acute Care | Admitting: Acute Care

## 2019-02-27 ENCOUNTER — Other Ambulatory Visit: Payer: Self-pay

## 2019-02-27 DIAGNOSIS — F17210 Nicotine dependence, cigarettes, uncomplicated: Secondary | ICD-10-CM | POA: Diagnosis not present

## 2019-02-27 DIAGNOSIS — Z122 Encounter for screening for malignant neoplasm of respiratory organs: Secondary | ICD-10-CM

## 2019-03-03 ENCOUNTER — Other Ambulatory Visit: Payer: Self-pay | Admitting: *Deleted

## 2019-03-03 DIAGNOSIS — F17210 Nicotine dependence, cigarettes, uncomplicated: Secondary | ICD-10-CM

## 2019-03-03 DIAGNOSIS — Z122 Encounter for screening for malignant neoplasm of respiratory organs: Secondary | ICD-10-CM

## 2019-04-08 ENCOUNTER — Other Ambulatory Visit: Payer: Self-pay | Admitting: Family Medicine

## 2019-05-16 ENCOUNTER — Other Ambulatory Visit: Payer: Self-pay | Admitting: Family Medicine

## 2019-07-07 ENCOUNTER — Other Ambulatory Visit: Payer: Self-pay | Admitting: Family Medicine

## 2019-10-04 ENCOUNTER — Other Ambulatory Visit: Payer: Self-pay | Admitting: Family Medicine

## 2019-10-12 LAB — PSA: PSA: 0.13

## 2019-10-28 ENCOUNTER — Encounter: Payer: Self-pay | Admitting: Family Medicine

## 2019-12-31 ENCOUNTER — Other Ambulatory Visit: Payer: Self-pay | Admitting: Family Medicine

## 2020-02-03 ENCOUNTER — Other Ambulatory Visit: Payer: Self-pay | Admitting: Family Medicine

## 2020-03-06 ENCOUNTER — Other Ambulatory Visit: Payer: Self-pay | Admitting: Family Medicine

## 2020-03-07 NOTE — Patient Instructions (Addendum)
Health Maintenance Due  Topic Date Due   INFLUENZA VACCINE -today 02/04/2020   You will be due for colonoscopy in November- when you lookat your calendar and find some time that would work-please let us know and we can place a referral-you also likely can simply call their office and they can set you up for repeat  If you stretch to yearly physical please at least check blood pressure once a month to make sure less than 140/90  Would love for you to quit smoking- one of the best things you can do for your health!   Please stop by lab before you go If you have mychart- we will send your results within 3 business days of [place] receiving them.  If you do not have mychart- we will call you about results within 5 business days of [place] receiving them.  *please note we are currently using [facility] which has a longer processing time than [place] typically so labs may not come back as quickly as in the past *please also note that you will see labs on mychart as soon as they post. I will later go in and write notes on them- will say "notes from Dr. Yong Channel"

## 2020-03-07 NOTE — Progress Notes (Deleted)
Duplicate note

## 2020-03-08 ENCOUNTER — Encounter: Payer: Self-pay | Admitting: Family Medicine

## 2020-03-08 ENCOUNTER — Other Ambulatory Visit: Payer: Self-pay

## 2020-03-08 ENCOUNTER — Ambulatory Visit (INDEPENDENT_AMBULATORY_CARE_PROVIDER_SITE_OTHER): Payer: No Typology Code available for payment source | Admitting: Family Medicine

## 2020-03-08 VITALS — BP 136/78 | HR 82 | Temp 98.6°F | Ht 69.5 in | Wt 182.0 lb

## 2020-03-08 DIAGNOSIS — Z23 Encounter for immunization: Secondary | ICD-10-CM | POA: Diagnosis not present

## 2020-03-08 DIAGNOSIS — Z720 Tobacco use: Secondary | ICD-10-CM

## 2020-03-08 DIAGNOSIS — J439 Emphysema, unspecified: Secondary | ICD-10-CM | POA: Insufficient documentation

## 2020-03-08 DIAGNOSIS — Z125 Encounter for screening for malignant neoplasm of prostate: Secondary | ICD-10-CM | POA: Diagnosis not present

## 2020-03-08 DIAGNOSIS — Z0000 Encounter for general adult medical examination without abnormal findings: Secondary | ICD-10-CM

## 2020-03-08 DIAGNOSIS — I10 Essential (primary) hypertension: Secondary | ICD-10-CM

## 2020-03-08 DIAGNOSIS — E785 Hyperlipidemia, unspecified: Secondary | ICD-10-CM

## 2020-03-08 LAB — POC URINALSYSI DIPSTICK (AUTOMATED)
Bilirubin, UA: NEGATIVE
Blood, UA: 10
Glucose, UA: NEGATIVE
Ketones, UA: NEGATIVE
Leukocytes, UA: NEGATIVE
Nitrite, UA: NEGATIVE
Protein, UA: POSITIVE — AB
Spec Grav, UA: 1.025 (ref 1.010–1.025)
Urobilinogen, UA: 0.2 E.U./dL
pH, UA: 6 (ref 5.0–8.0)

## 2020-03-08 MED ORDER — ATORVASTATIN CALCIUM 20 MG PO TABS: 20.0000 mg | ORAL_TABLET | Freq: Every day | ORAL | 3 refills | Status: DC

## 2020-03-08 MED ORDER — AMLODIPINE BESYLATE 5 MG PO TABS: 5.0000 mg | ORAL_TABLET | Freq: Every day | ORAL | 3 refills | Status: DC

## 2020-03-08 NOTE — Addendum Note (Signed)
Addended by: Genevie Cheshire L on: 03/08/2020 04:40 PM   Modules accepted: Orders

## 2020-03-08 NOTE — Progress Notes (Addendum)
Phone: [phone]   Subjective:  Patient presents today for their annual physical. Chief complaint-noted.   See problem oriented charting- Review of Systems  Constitutional: Negative for chills and fever.  HENT: Negative for hearing loss and tinnitus.   Eyes: Negative for blurred vision and double vision.  Respiratory: Negative for cough, shortness of breath and wheezing.   Cardiovascular: Negative for chest pain, palpitations and leg swelling.  Gastrointestinal: Negative for heartburn, nausea and vomiting.  Genitourinary: Negative for dysuria, frequency and urgency.  Skin: Negative for rash.  Neurological: Negative for dizziness, seizures and headaches.  Endo/Heme/Allergies: Does not bruise/bleed easily.  Psychiatric/Behavioral: Negative for depression, memory loss and suicidal ideas. The patient does not have insomnia.    The following were reviewed and entered/updated in epic: Past Medical History:  Diagnosis Date  . Arthritis   . ED (erectile dysfunction)   . History of adenomatous polyp of colon    05-27-2015  . History of gout    no issue since 2012  . Hyperlipidemia   . Hypertension   . Microhematuria   . Raynaud's syndrome   . Renal calculus, right   . Testicular hypogonadism   . Wears glasses    Patient Active Problem List   Diagnosis Date Noted  . Tobacco abuse 08/13/2014    Priority: High  . Hyperlipidemia 05/26/2010    Priority: Medium  . Gout 05/26/2010    Priority: Medium  . Essential hypertension 05/26/2010    Priority: Medium  . History of adenomatous polyp of colon 06/03/2015    Priority: Low  . Allergic rhinitis 08/13/2014    Priority: Low  . S/P shoulder replacement 08/17/2013    Priority: Low  . Acne rosacea 05/05/2013    Priority: Low   Past Surgical History:  Procedure Laterality Date  . BICEPS TENDON REPAIR Right 2003  . COLONOSCOPY  05-27-2015  . CYSTOSCOPY WITH URETEROSCOPY, STONE BASKETRY AND STENT PLACEMENT Right 09/27/2015    Procedure: 2ND STAGE CYSTOSCOPY RIGHT URETEROSCOPY STENT REPLACEMENT;  Surgeon: Alexis Frock, MD;  Location: [facility];  Service: Urology;  Laterality: Right;  . CYSTOSCOPY/URETEROSCOPY/HOLMIUM LASER/STENT PLACEMENT Right 09/13/2015   Procedure: 1ST STAGE - CYSTOSCOPY/URETEROSCOPY/HOLMIUM LASER/STENT PLACEMENT;  Surgeon: Alexis Frock, MD;  Location: [facility];  Service: Urology;  Laterality: Right;  . HOLMIUM LASER APPLICATION Right 0/98/1191   Procedure: HOLMIUM LASER APPLICATION;  Surgeon: Alexis Frock, MD;  Location: [facility];  Service: Urology;  Laterality: Right;  . HOLMIUM LASER APPLICATION Right 4/78/2956   Procedure: HOLMIUM LASER;  Surgeon: Alexis Frock, MD;  Location: [facility];  Service: Urology;  Laterality: Right;  . TOTAL SHOULDER ARTHROPLASTY Right 08/17/2013   RIGHT TOTAL SHOULDER ARTHROPLASTY;  Surgeon: [name], MD;  Location: [place] [facility];  Service: Orthopedics;  Laterality: Right;  . URETEROLITHOTOMY  2017    Family History  Problem Relation Age of Onset  . Coronary artery disease Father        CABG 45, former smoker  . Diabetes Father   . Hypertension Father   . Hyperlipidemia Mother        trig  . Hypertension Sister        HLD  . Colon cancer Neg Hx   . Esophageal cancer Neg Hx   . Rectal cancer Neg Hx   . Stomach cancer Neg Hx   . Prostate cancer Neg Hx     Medications- reviewed and updated Current Outpatient Medications  Medication Sig Dispense Refill  .  amLODipine (NORVASC) 5 MG tablet TAKE 1 TABLET BY MOUTH EVERY DAY 90 tablet 0  . amoxicillin (AMOXIL) 500 MG capsule TAKE 4 CAPSULES BY MOUTH 1 HOUR PRIOR TO DENTAL APPT    . atorvastatin (LIPITOR) 20 MG tablet TAKE 1 TABLET (20 MG TOTAL) BY MOUTH DAILY. PLEASE SCHEDULE FOLLOW UP APPT FOR FURTHER REFILLS [phone] 30 tablet 0  . fluticasone (FLONASE) 50 MCG/ACT nasal spray Place 1 spray into both nostrils daily as needed.     .  sildenafil (REVATIO) 20 MG tablet Take 20 mg by mouth 3 (three) times daily.     No current facility-administered medications for this visit.    Allergies-reviewed and updated Allergies  Allergen Reactions  . Lisinopril Cough    Social History   Social History Narrative   Married. Son and daughter (85 and 43 in 2021).  Grandchild in [place] with daughter summer 2021.       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 Oropharynx normal Neck: no thyromegaly CV: RRR no murmurs rubs or gallops Lungs: CTAB no crackles, wheeze, rhonchi Abdomen: soft/nontender/nondistended/normal bowel sounds. No rebound or guarding.  Ext: trace edema Skin: warm, dry Neuro: grossly normal, moves all extremities, PERRLA    Assessment and Plan  59 y.o. male presenting for annual physical.  Health Maintenance counseling: 1. Anticipatory guidance: Patient counseled regarding regular dental exams q6 months, eye exams every two years,  avoiding smoking and second hand smoke- see below current smoker , limiting alcohol to 2 beverages per day -  2-3 drinks 3 times a week.    2. Risk factor reduction:  Advised patient of need for regular exercise and diet rich and fruits and vegetables to reduce risk of heart attack and stroke. Exercise-  has not exercised much due to covid- doing a lot of physical work with his caring for golf courses. Diet-has healthy diet limits portion control.  Previously in 190s and has done a good job keeping weight down.  Wt Readings from Last 3 Encounters:  03/08/20 182 lb (82.6  kg)  12/02/18 179 lb 9.6 oz (81.5 kg)  11/30/17 184 lb (83.5 kg)  3. Immunizations/screenings/ancillary studies-  Immunization History  Administered Date(s) Administered  . Influenza Whole 05/26/2010  . Influenza, Quadrivalent, Recombinant, Inj, Pf 04/26/2019  . Influenza,inj,Quad PF,6+ Mos 04/07/2016  . Moderna SARS-COVID-2 Vaccination 10/23/2019, 11/20/2019  . Pneumococcal Polysaccharide-23 08/18/2013  . Td 05/26/2010, 04/07/2016  . Zoster Recombinat (Shingrix) 04/26/2019, 08/29/2019  4. Prostate cancer screening- does yearly with urology.   Lab Results  Component Value Date   PSA 0.13 10/12/2019   PSA 0.18 08/10/2017   PSA 0.14 04/23/2015   5. Colon cancer screening -  up to date due 05/26/2020- gave him a reminder on avs 6. Skin cancer screening- due for follow up- agrees to schedule. advised regular sunscreen use. Denies worrisome, changing, or new skin lesions.  7. Current  Smoker- 1 pack per 3 days recently. Enrolled in lung cancer screening program. They did note  emphysema. Will get UA 8. STD screening - monogamous   Status of chronic or acute concerns   #hypertension S: medication: amlodipine 5 mg Home readings #s:  Does not check BP Readings from Last 3 Encounters:  03/08/20 136/78  12/02/18 135/88  11/30/17 130/82  A/P: Good control blood pressure in office-continue amlodipine 5 mg-refills provided today  #hyperlipidemia S: Medication: atovastatin 20mg  daily  Lab Results  Component Value Date   CHOL 163 12/02/2018   HDL 64.40 12/02/2018   LDLCALC 77 12/02/2018   LDLDIRECT 85.0 11/30/2017   TRIG 108.0 12/02/2018   CHOLHDL 3 12/02/2018   A/P: Hopefully LDL below 70-if not we could consider either increase to atorvastatin 40 mg or changing to rosuvastatin 20 mg  #ED- sildenafil helpful when needed  From urology  #sciatica- inversion tablet has helped him.   Recommended follow up: Return in about 6 months (around 09/05/2020) for follow up- or sooner if  needed.  Lab/Order associations:not  fasting   ICD-10-CM   1. Preventative health care  I01.65 COMPLETE METABOLIC PANEL WITH GFR    CBC with Differential/Platelet    Lipid panel    Ambulatory Referral for Lung Cancer Scre    POCT Urinalysis Dipstick (Automated)  2. Hyperlipidemia, unspecified hyperlipidemia type  V37.4 COMPLETE METABOLIC PANEL WITH GFR    CBC with Differential/Platelet    Lipid panel  3. Screening for prostate cancer  Z12.5   4. Tobacco abuse  Z72.0 Ambulatory Referral for Lung Cancer Scre    POCT Urinalysis Dipstick (Automated)   Meds ordered this encounter  Medications  . amLODipine (NORVASC) 5 MG tablet    Sig: Take 1 tablet (5 mg total) by mouth daily.    Dispense:  90 tablet    Refill:  3  . atorvastatin (LIPITOR) 20 MG tablet    Sig: Take 1 tablet (20 mg total) by mouth daily.    Dispense:  90 tablet    Refill:  3   Return precautions advised.  Garret Reddish, MD

## 2020-03-09 LAB — LIPID PANEL
Cholesterol: 147 mg/dL (ref <200)
HDL: 50 mg/dL (ref >=40)
LDL Cholesterol (Calc): 61 mg/dL (calc)
Non-HDL Cholesterol (Calc): 97 mg/dL (calc) (ref <130)
Total CHOL/HDL Ratio: 2.9 (calc) (ref <5.0)
Triglycerides: 343 mg/dL — ABNORMAL HIGH (ref <150)

## 2020-03-09 LAB — COMPLETE METABOLIC PANEL WITH GFR
AG Ratio: 1.8 (calc) (ref 1.0–2.5)
ALT: 30 U/L (ref 9–46)
AST: 26 U/L (ref 10–35)
Albumin: 4.1 g/dL (ref 3.6–5.1)
Alkaline phosphatase (APISO): 79 U/L (ref 35–144)
BUN: 16 mg/dL (ref 7–25)
CO2: 20 mmol/L (ref 20–32)
Calcium: 9.1 mg/dL (ref 8.6–10.3)
Chloride: 107 mmol/L (ref 98–110)
Creat: 1.17 mg/dL (ref 0.70–1.33)
GFR, Est African American: 80 mL/min/{1.73_m2} (ref >=60)
GFR, Est Non African American: 69 mL/min/{1.73_m2} (ref >=60)
Globulin: 2.3 g/dL (calc) (ref 1.9–3.7)
Glucose, Bld: 92 mg/dL (ref 65–99)
Potassium: 3.9 mmol/L (ref 3.5–5.3)
Sodium: 138 mmol/L (ref 135–146)
Total Bilirubin: 0.4 mg/dL (ref 0.2–1.2)
Total Protein: 6.4 g/dL (ref 6.1–8.1)

## 2020-03-09 LAB — CBC WITH DIFFERENTIAL/PLATELET
Absolute Monocytes: 854 cells/uL (ref 200–950)
Basophils Absolute: 48 cells/uL (ref 0–200)
Basophils Relative: 0.5 %
Eosinophils Absolute: 384 cells/uL (ref 15–500)
Eosinophils Relative: 4 %
HCT: 44.2 % (ref 38.5–50.0)
Hemoglobin: 14.8 g/dL (ref 13.2–17.1)
Lymphs Abs: 1565 cells/uL (ref 850–3900)
MCH: 29.7 pg (ref 27.0–33.0)
MCHC: 33.5 g/dL (ref 32.0–36.0)
MCV: 88.8 fL (ref 80.0–100.0)
MPV: 10.6 fL (ref 7.5–12.5)
Monocytes Relative: 8.9 %
Neutro Abs: 6749 cells/uL (ref 1500–7800)
Neutrophils Relative %: 70.3 %
Platelets: 224 10*3/uL (ref 140–400)
RBC: 4.98 10*6/uL (ref 4.20–5.80)
RDW: 13.1 % (ref 11.0–15.0)
Total Lymphocyte: 16.3 %
WBC: 9.6 10*3/uL (ref 3.8–10.8)

## 2020-03-12 ENCOUNTER — Other Ambulatory Visit: Payer: Self-pay

## 2020-03-12 DIAGNOSIS — R809 Proteinuria, unspecified: Secondary | ICD-10-CM

## 2020-03-12 DIAGNOSIS — R319 Hematuria, unspecified: Secondary | ICD-10-CM

## 2020-03-13 NOTE — Progress Notes (Signed)
Returned pt call and lab appt made.

## 2020-03-13 NOTE — Progress Notes (Unsigned)
Patient calling back about lab results.  °

## 2020-03-18 ENCOUNTER — Other Ambulatory Visit: Payer: Self-pay

## 2020-03-18 ENCOUNTER — Other Ambulatory Visit: Payer: No Typology Code available for payment source

## 2020-03-18 DIAGNOSIS — R319 Hematuria, unspecified: Secondary | ICD-10-CM

## 2020-03-19 LAB — URINALYSIS, MICROSCOPIC ONLY
Bacteria, UA: NONE SEEN /HPF
Hyaline Cast: NONE SEEN /LPF
RBC / HPF: NONE SEEN /HPF (ref 0–2)
Squamous Epithelial / HPF: NONE SEEN /HPF (ref <=5)
WBC, UA: NONE SEEN /HPF (ref 0–5)

## 2020-05-22 ENCOUNTER — Other Ambulatory Visit: Payer: Self-pay | Admitting: *Deleted

## 2020-05-22 DIAGNOSIS — F17210 Nicotine dependence, cigarettes, uncomplicated: Secondary | ICD-10-CM

## 2020-08-15 ENCOUNTER — Encounter: Payer: Self-pay | Admitting: Gastroenterology

## 2020-10-04 DIAGNOSIS — Z87898 Personal history of other specified conditions: Secondary | ICD-10-CM

## 2020-10-04 HISTORY — DX: Personal history of other specified conditions: Z87.898

## 2020-10-21 ENCOUNTER — Other Ambulatory Visit: Payer: Self-pay

## 2020-10-21 ENCOUNTER — Encounter: Payer: Self-pay | Admitting: Family Medicine

## 2020-10-21 ENCOUNTER — Ambulatory Visit (INDEPENDENT_AMBULATORY_CARE_PROVIDER_SITE_OTHER): Payer: No Typology Code available for payment source | Admitting: Family Medicine

## 2020-10-21 VITALS — BP 145/80 | HR 79 | Temp 97.8°F | Wt 180.1 lb

## 2020-10-21 DIAGNOSIS — R55 Syncope and collapse: Secondary | ICD-10-CM | POA: Diagnosis not present

## 2020-10-21 LAB — CBC WITH DIFFERENTIAL/PLATELET
Basophils Absolute: 0.1 10*3/uL (ref 0.0–0.1)
Basophils Relative: 0.8 % (ref 0.0–3.0)
Eosinophils Absolute: 0.2 10*3/uL (ref 0.0–0.7)
Eosinophils Relative: 1.6 % (ref 0.0–5.0)
HCT: 49.6 % (ref 39.0–52.0)
Hemoglobin: 16.4 g/dL (ref 13.0–17.0)
Lymphocytes Relative: 14.3 % (ref 12.0–46.0)
Lymphs Abs: 1.7 10*3/uL (ref 0.7–4.0)
MCHC: 33 g/dL (ref 30.0–36.0)
MCV: 90.4 fl (ref 78.0–100.0)
Monocytes Absolute: 0.7 10*3/uL (ref 0.1–1.0)
Monocytes Relative: 6 % (ref 3.0–12.0)
Neutro Abs: 9 10*3/uL — ABNORMAL HIGH (ref 1.4–7.7)
Neutrophils Relative %: 77.3 % — ABNORMAL HIGH (ref 43.0–77.0)
Platelets: 204 10*3/uL (ref 150.0–400.0)
RBC: 5.49 Mil/uL (ref 4.22–5.81)
RDW: 14 % (ref 11.5–15.5)
WBC: 11.6 10*3/uL — ABNORMAL HIGH (ref 4.0–10.5)

## 2020-10-21 LAB — BASIC METABOLIC PANEL
BUN: 13 mg/dL (ref 6–23)
CO2: 28 mEq/L (ref 19–32)
Calcium: 9.8 mg/dL (ref 8.4–10.5)
Chloride: 103 mEq/L (ref 96–112)
Creatinine, Ser: 1.13 mg/dL (ref 0.40–1.50)
GFR: 71.79 mL/min (ref >60.00)
Glucose, Bld: 101 mg/dL — ABNORMAL HIGH (ref 70–99)
Potassium: 4.5 mEq/L (ref 3.5–5.1)
Sodium: 140 mEq/L (ref 135–145)

## 2020-10-21 LAB — MAGNESIUM: Magnesium: 1.8 mg/dL (ref 1.5–2.5)

## 2020-10-21 NOTE — Patient Instructions (Signed)
Syncope  Syncope refers to a condition in which a person temporarily loses consciousness. Syncope may also be called fainting or passing out. It is caused by a sudden decrease in blood flow to the brain. Even though most causes of syncope are not dangerous, syncope can be a sign of a serious medical problem. Your health care provider may do tests to find the reason why you are having syncope. Signs that you may be about to faint include:  Feeling dizzy or light-headed.  Feeling nauseous.  Seeing all white or all black in your field of vision.  Having cold, clammy skin. If you faint, get medical help right away. Call your local emergency services (911 in the [place] Do not drive yourself to the hospital. Follow these instructions at home: Pay attention to any changes in your symptoms. Take these actions to stay safe and to help relieve your symptoms: Lifestyle  Do not drive, use machinery, or play sports until your health care provider says it is okay.  Do not drink alcohol.  Do not use any products that contain nicotine or tobacco, such as cigarettes and e-cigarettes. If you need help quitting, ask your health care provider.  Drink enough fluid to keep your urine pale yellow. General instructions  Take over-the-counter and prescription medicines only as told by your health care provider.  If you are taking blood pressure or heart medicine, get up slowly and take several minutes to sit and then stand. This can reduce dizziness or light-headedness.  Have someone stay with you until you feel stable.  If you start to feel like you might faint, lie down right away and raise (elevate) your feet above the level of your heart. Breathe deeply and steadily. Wait until all the symptoms have passed.  Keep all follow-up visits as told by your health care provider. This is important. Get help right away if you:  Have a severe headache.  Faint once or repeatedly.  Have pain in your chest,  abdomen, or back.  Have a very fast or irregular heartbeat (palpitations).  Have pain when you breathe.  Are bleeding from your mouth or rectum, or you have black or tarry stool.  Have a seizure.  Are confused.  Have trouble walking.  Have severe weakness.  Have vision problems. These symptoms may represent a serious problem that is an emergency. Do not wait to see if your symptoms will go away. Get medical help right away. Call your local emergency services (911 in the [place] Do not drive yourself to the hospital. Summary  Syncope refers to a condition in which a person temporarily loses consciousness. It is caused by a sudden decrease in blood flow to the brain.  Signs that you may be about to faint include dizziness, feeling light-headed, feeling nauseous, sudden vision changes, or cold, clammy skin.  Although most causes of syncope are not dangerous, syncope can be a sign of a serious medical problem. If you faint, get medical help right away. This information is not intended to replace advice given to you by your health care provider. Make sure you discuss any questions you have with your health care provider. Document Revised: 11/02/2019 Document Reviewed: 11/02/2019 Elsevier Patient Education  2021 [employer] Inc.  

## 2020-10-21 NOTE — Progress Notes (Signed)
Established Patient Office Visit  Subjective:  Patient ID: [name] [name], male    DOB: Aug 08, 1960  Age: 60 y.o. MRN: [id number]  CC:  Chief Complaint  Patient presents with  . Loss of Consciousness    "blacked out" Friday morning at work, fell face first, was down for a few seconds and was able to get up on his own.     HPI [name] [name] presents for very brief syncopal episode which occurred this past Friday around 8 AM.  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  He was blowing off greens and had already been working for about 30 minutes when he has brief recollection of being lightheaded and then apparently fell face forward.  There was a coworker with him who observed and states that he was out for literally only couple of seconds and then he got up on his own power.  He did not recall any dizziness afterwards.  No chest pain.  No palpitations.  No nausea or vomiting.  He was immediately aware of his surroundings.  No history of seizures.  No history of vasovagal syncope.  He does not recall any dizziness that morning when he first got up and states that he "felt fine ".  He actually continued to work the remainder of the day although several of his coworkers had advised him to go home.  He did not have any further dizziness that day.  He denies any prior history of syncope.  No recent dizziness and no episodes of dizziness or syncope since episode Friday.  He has history of hypertension which is treated with amlodipine 5 mg daily.  He has hyperlipidemia treated with atorvastatin 20 mg daily. His work is fairly physical and he has not had any recent intolerance with physical exertion  Family history significant for father having coronary disease apparently in his mid 73s.  He was a smoker.  Past Medical History:  Diagnosis Date  . Arthritis   . ED (erectile dysfunction)   . History of adenomatous polyp of colon    05-27-2015  . History of gout    no  issue since 2012  . Hyperlipidemia   . Hypertension   . Microhematuria   . Raynaud's syndrome   . Renal calculus, right   . Testicular hypogonadism   . Wears glasses     Past Surgical History:  Procedure Laterality Date  . BICEPS TENDON REPAIR Right 2003  . COLONOSCOPY  05-27-2015  . CYSTOSCOPY WITH URETEROSCOPY, STONE BASKETRY AND STENT PLACEMENT Right 09/27/2015   Procedure: 2ND STAGE CYSTOSCOPY RIGHT URETEROSCOPY STENT REPLACEMENT;  Surgeon: Alexis Frock, MD;  Location: [facility];  Service: Urology;  Laterality: Right;  . CYSTOSCOPY/URETEROSCOPY/HOLMIUM LASER/STENT PLACEMENT Right 09/13/2015   Procedure: 1ST STAGE - CYSTOSCOPY/URETEROSCOPY/HOLMIUM LASER/STENT PLACEMENT;  Surgeon: Alexis Frock, MD;  Location: [facility];  Service: Urology;  Laterality: Right;  . HOLMIUM LASER APPLICATION Right 8/84/1660   Procedure: HOLMIUM LASER APPLICATION;  Surgeon: Alexis Frock, MD;  Location[facility];  Service: Urology;  Laterality: Right;  . HOLMIUM LASER APPLICATION Right 01/03/1600   Procedure: HOLMIUM LASER;  Surgeon: Alexis Frock, MD;  Location: [facility];  Service: Urology;  Laterality: Right;  . TOTAL SHOULDER ARTHROPLASTY Right 08/17/2013   RIGHT TOTAL SHOULDER ARTHROPLASTY;  Surgeon: [name], MD;  Location: [place];  Service: Orthopedics;  Laterality: Right;  . URETEROLITHOTOMY  2017    Family History  Problem Relation  Age of Onset  . Coronary artery disease Father        CABG 30, former smoker  . Diabetes Father   . Hypertension Father   . Hyperlipidemia Mother        trig  . Hypertension Sister        HLD  . Colon cancer Neg Hx   . Esophageal cancer Neg Hx   . Rectal cancer Neg Hx   . Stomach cancer Neg Hx   . Prostate cancer Neg Hx     Social History   Socioeconomic History  . Marital status: Married    Spouse name: Not on file  . Number of children: Not on file  . Years of education: Not  on file  . Highest education level: Not on file  Occupational History  . Not on file  Tobacco Use  . Smoking status: Current Some Day Smoker    Packs/day: 1.00    Years: 40.00    Pack years: 40.00    Types: Cigarettes, Cigars  . Smokeless tobacco: Never Used  . Tobacco comment: quit cigarett's 2014  was 5 cig. per day/   currently occasional cigar  Substance and Sexual Activity  . Alcohol use: Yes    Alcohol/week: 2.0 - 4.0 standard drinks    Types: 2 - 4 Shots of liquor per week  . Drug use: No  . Sexual activity: Not on file  Other Topics Concern  . Not on file  Social History Narrative   Married. Son and daughter (1 and 28 in 2021).  Grandchild in [place] with daughter summer 2021.       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 amLODipine (NORVASC) 5 MG tablet Take 1 tablet (5 mg total) by mouth daily. 90 tablet 3  . amoxicillin (AMOXIL) 500 MG capsule TAKE 4 CAPSULES BY MOUTH 1 HOUR PRIOR TO DENTAL APPT    . atorvastatin (LIPITOR) 20 MG tablet Take 1 tablet (20 mg total) by mouth daily. 90 tablet 3  . fluticasone (FLONASE) 50 MCG/ACT nasal spray Place 1 spray into both nostrils daily as needed.     . sildenafil (REVATIO) 20 MG tablet Take 20 mg by mouth 3 (three) times daily.     No facility-administered medications prior to visit.    Allergies  Allergen Reactions  . Lisinopril Cough    ROS Review of Systems  Constitutional: Negative for chills, fatigue  and fever.  Eyes: Negative for visual disturbance.  Respiratory: Negative for cough, chest tightness and shortness of breath.   Cardiovascular: Negative for chest pain, palpitations and leg swelling.  Gastrointestinal: Negative for abdominal pain.  Neurological: Positive for syncope. Negative for tremors, seizures, facial asymmetry, speech difficulty, weakness, light-headedness and headaches.  Psychiatric/Behavioral: Negative for confusion.      Objective:    Physical Exam Vitals reviewed.  Constitutional:      Appearance: Normal appearance.  Cardiovascular:     Rate  and Rhythm: Normal rate.  Pulmonary:     Effort: Pulmonary effort is normal.     Breath sounds: Normal breath sounds.  Neurological:     General: No focal deficit present.     Mental Status: He is alert and oriented to person, place, and time.     Cranial Nerves: No cranial nerve deficit.     Motor: No weakness.     BP 140/68 (BP Location: Left Arm, Patient Position: Sitting, Cuff Size: Normal)   Pulse 79   Temp 97.8 F (36.6 C) (Oral)   Wt 180 lb 1.6 oz (81.7 kg)   SpO2 98%   BMI 26.21 kg/m  Wt Readings from Last 3 Encounters:  10/21/20 180 lb 1.6 oz (81.7 kg)  03/08/20 182 lb (82.6 kg)  12/02/18 179 lb 9.6 oz (81.5 kg)     Health Maintenance Due  Topic Date Due  . COLONOSCOPY (Pts 45-25yrs Insurance coverage will need to be confirmed)  05/26/2020    There are no preventive care reminders to display for this patient.  Lab Results  Component Value Date   TSH 1.98 04/23/2015   Lab Results  Component Value Date   WBC 9.6 03/08/2020   HGB 14.8 03/08/2020   HCT 44.2 03/08/2020   MCV 88.8 03/08/2020   PLT 224 03/08/2020   Lab Results  Component Value Date   NA 138 03/08/2020   K 3.9 03/08/2020   CO2 20 03/08/2020   GLUCOSE 92 03/08/2020   BUN 16 03/08/2020   CREATININE 1.17 03/08/2020   BILITOT 0.4 03/08/2020   ALKPHOS 76 12/02/2018   AST 26 03/08/2020   ALT 30 03/08/2020   PROT 6.4  03/08/2020   ALBUMIN 4.3 12/02/2018   CALCIUM 9.1 03/08/2020   GFR 69.89 12/02/2018   Lab Results  Component Value Date   CHOL 147 03/08/2020   Lab Results  Component Value Date   HDL 50 03/08/2020   Lab Results  Component Value Date   LDLCALC 61 03/08/2020   Lab Results  Component Value Date   TRIG 343 (H) 03/08/2020   Lab Results  Component Value Date   CHOLHDL 2.9 03/08/2020   No results found for: HGBA1C    Assessment & Plan:   Brief syncopal episode which occurred this past Friday at work.  No episodes since then.  Etiology unclear.  Low clinical suspicion for seizure.  No history of autonomic dysfunction.  He has not described any recent orthostatic type symptoms.  This did not sound like likely vasovagal syncope.    On initial palpation on physical exam patient appeared to be in likely trigeminy type pattern with consistent pause after 2 regular beats.  EKG shows sinus rhythm with occasional PVC but no acute ST-T changes.  -In addition EKG (above) we recommended checking basic metabolic panel, CBC, magnesium level -Consider cardiac event monitor -Consider echocardiogram -Follow-up immediately for any recurrent episodes of dizziness or syncope.  No orders of the defined types were placed in this encounter.   Follow-up: No follow-ups on file.    Carolann Littler, MD

## 2020-10-24 ENCOUNTER — Ambulatory Visit (INDEPENDENT_AMBULATORY_CARE_PROVIDER_SITE_OTHER): Payer: No Typology Code available for payment source

## 2020-10-24 DIAGNOSIS — R55 Syncope and collapse: Secondary | ICD-10-CM | POA: Diagnosis not present

## 2020-11-11 ENCOUNTER — Telehealth: Payer: Self-pay | Admitting: Family Medicine

## 2020-11-11 ENCOUNTER — Telehealth: Payer: Self-pay | Admitting: *Deleted

## 2020-11-11 NOTE — Telephone Encounter (Signed)
Advised patient, I will contact [employer] to ship him a bridge and 66M sensitive skin repositionable electrodes.  He can remove monitor and allow his skin to heal until he receives the alternative supplies.  Explained it would be better if we could monitor him the whole 30 days if at all possible. Taking a break to allow skin to heal would be better than discontinuing monitor all together.

## 2020-11-11 NOTE — Telephone Encounter (Signed)
New Messge:     Pt had to stop wearing the monitor, because he was allergic to the strips. He wants to know if there was enough information recorded before he had to take it off?

## 2020-11-11 NOTE — Telephone Encounter (Signed)
The patient was referred to cardiac for a heart monitor on 04/18 and he has been wearing it for about 2 weeks now and the adhesive it breaking his skin out so he took the monitor off 05/06 and was wondering if you think that they have received enough information or does he need to try and put the monitor back on?  Please advise

## 2020-11-11 NOTE — Telephone Encounter (Signed)
Called and spoke with pt and advised he reach out to cardiology in regards to this, pt states he will and he will give [place] a call back if he needs a visit regarding the rash/skin irritation.

## 2020-11-19 LAB — PSA: PSA: 0.14

## 2020-11-21 ENCOUNTER — Telehealth: Payer: Self-pay

## 2020-11-21 DIAGNOSIS — Z122 Encounter for screening for malignant neoplasm of respiratory organs: Secondary | ICD-10-CM

## 2020-11-21 NOTE — Telephone Encounter (Signed)
Referral has been placed. 

## 2020-11-21 NOTE — Telephone Encounter (Signed)
Patient is requesting a new referral to get a lung cancer screening same as last year  Please advise  and if we have any questions please call [phone] stephanie ( patients wife)

## 2020-11-27 ENCOUNTER — Ambulatory Visit (HOSPITAL_COMMUNITY): Payer: No Typology Code available for payment source

## 2020-11-28 ENCOUNTER — Other Ambulatory Visit: Payer: Self-pay | Admitting: Urology

## 2020-11-28 ENCOUNTER — Encounter: Payer: Self-pay | Admitting: Family Medicine

## 2020-12-04 ENCOUNTER — Ambulatory Visit (HOSPITAL_COMMUNITY)
Admission: RE | Admit: 2020-12-04 | Discharge: 2020-12-04 | Disposition: A | Discharge status: Home / Self Care | Payer: No Typology Code available for payment source | Source: Ambulatory Visit | Attending: Family Medicine | Admitting: Family Medicine

## 2020-12-04 ENCOUNTER — Other Ambulatory Visit: Payer: Self-pay

## 2020-12-04 DIAGNOSIS — I10 Essential (primary) hypertension: Secondary | ICD-10-CM | POA: Diagnosis not present

## 2020-12-04 DIAGNOSIS — I340 Nonrheumatic mitral (valve) insufficiency: Secondary | ICD-10-CM | POA: Diagnosis not present

## 2020-12-04 DIAGNOSIS — E785 Hyperlipidemia, unspecified: Secondary | ICD-10-CM | POA: Diagnosis not present

## 2020-12-04 DIAGNOSIS — R55 Syncope and collapse: Secondary | ICD-10-CM

## 2020-12-04 LAB — ECHOCARDIOGRAM COMPLETE
Area-P 1/2: 3.65 cm2
Calc EF: 49.8 %
S' Lateral: 3.5 cm
Single Plane A2C EF: 50.1 %
Single Plane A4C EF: 47.3 %

## 2020-12-04 NOTE — Progress Notes (Signed)
  Echocardiogram 2D Echocardiogram has been performed.  Jennette Dubin 12/04/2020, 8:45 AM

## 2020-12-05 ENCOUNTER — Telehealth: Payer: Self-pay | Admitting: Family Medicine

## 2020-12-05 NOTE — Telephone Encounter (Signed)
Team I received a request for surgical clearance-patient had syncopal episode in April and had a 30-day event monitor which was largely reassuring other than some PVCs.  When he had his echocardiogram he was experiencing frequent PVCs though and since syncope remains not clearly explained-I would recommend referral to cardiology under preoperative evaluation-he has urological procedure on June 29 and would ideally like for him to be seen before that time if possible for their expert opinion.  Team please place this referral and speak with referral coordinator about hopefully getting this done rather soon.  Please also inform patient

## 2020-12-09 ENCOUNTER — Other Ambulatory Visit: Payer: Self-pay | Admitting: Family Medicine

## 2020-12-09 ENCOUNTER — Other Ambulatory Visit: Payer: Self-pay

## 2020-12-09 DIAGNOSIS — I493 Ventricular premature depolarization: Secondary | ICD-10-CM

## 2020-12-09 DIAGNOSIS — I472 Ventricular tachycardia, unspecified: Secondary | ICD-10-CM

## 2020-12-09 DIAGNOSIS — R55 Syncope and collapse: Secondary | ICD-10-CM

## 2020-12-09 NOTE — Telephone Encounter (Signed)
Pt returned call and made aware of the below. Referral has been placed.

## 2020-12-09 NOTE — Telephone Encounter (Signed)
Called and lm on pt vm tcb. 

## 2020-12-17 ENCOUNTER — Ambulatory Visit (INDEPENDENT_AMBULATORY_CARE_PROVIDER_SITE_OTHER)
Admission: RE | Admit: 2020-12-17 | Discharge: 2020-12-17 | Disposition: A | Discharge status: Home / Self Care | Payer: No Typology Code available for payment source | Source: Ambulatory Visit | Attending: Acute Care | Admitting: Acute Care

## 2020-12-17 ENCOUNTER — Other Ambulatory Visit: Payer: Self-pay

## 2020-12-17 DIAGNOSIS — F17210 Nicotine dependence, cigarettes, uncomplicated: Secondary | ICD-10-CM | POA: Diagnosis not present

## 2020-12-18 ENCOUNTER — Telehealth: Payer: Self-pay | Admitting: Cardiovascular Disease

## 2020-12-18 NOTE — Telephone Encounter (Signed)
   [facility] Pre-operative Risk Assessment    Patient Name: [name] [name]  DOB: 1961-06-11  MRN: [id number]   HEARTCARE STAFF: - Please ensure there is not already an duplicate clearance open for this procedure. - Under Visit Info/Reason for Call, type in Other and utilize the format Clearance MM/DD/YY or Clearance TBD. Do not use dashes or single digits. - If request is for dental extraction, please clarify the # of teeth to be extracted. - If the patient is currently at the dentist's office, call Pre-Op APP to address. If the patient is not currently in the dentist office, please route to the Pre-Op pool  Request for surgical clearance:  What type of surgery is being performed? Ureteroscopy   When is this surgery scheduled? 01/01/2021  What type of clearance is required (medical clearance vs. Pharmacy clearance to hold med vs. Both)? Both  Are there any medications that need to be held prior to surgery and how long? Aspirin 5 days prior  Practice name and name of physician performing surgery? [facility], Dr. Alexis Frock  What is the office phone number? [phone]   7.   What is the office fax number? [phone]  8.   Anesthesia type (None, local, MAC, general) ? general   Selena Zobro 12/18/2020, 3:58 PM  _________________________________________________________________   (provider comments below)

## 2020-12-18 NOTE — Telephone Encounter (Signed)
Call and leave message on machine to give our office a call back concerning pt

## 2020-12-18 NOTE — Telephone Encounter (Signed)
   Name: [name] [name]  DOB: 09-01-1960  MRN: [id number]  Primary Cardiologist: None  This patient does not follow with our team   Pre-op covering staff: - Please contact requesting surgeon's office via preferred method (i.e, phone, fax) to inform them of this information.   Kathyrn Drown, NP  12/18/2020, 4:20 PM

## 2020-12-19 NOTE — Telephone Encounter (Signed)
Foster Simpson was returning your call.

## 2020-12-19 NOTE — Telephone Encounter (Signed)
Pt has appt 12/26/20 with Dr. Percival Spanish for pre op clearance. Will forward clearance notes to MD for upcoming appt. Will send FYI to requesting office pt has appt 12/26/20.

## 2020-12-19 NOTE — Telephone Encounter (Signed)
Received a call back from patient advised appointment moved up to 6/23 at 11:40 am with Dr.Hochrein.Directions given.

## 2020-12-19 NOTE — Telephone Encounter (Signed)
Spoke to Hiram with [facility] she stated patient is scheduled to have kidney stone surgery 6/29 and he will need a cardiology clearance.Stated he has appt scheduled 02/11/21 with Dr.Berry.Stated he is a new patient She requested a sooner appointment.Appointment rescheduled to 6/23 at 11:40 am with Dr.Hochrein.  Called patient left message to call.

## 2020-12-25 ENCOUNTER — Encounter: Payer: Self-pay | Admitting: Cardiology

## 2020-12-25 NOTE — Progress Notes (Deleted)
Cardiology Office Note   Date:  12/25/2020   ID:  [name] [name], DOB 07-Oct-1960, MRN [id number]  PCP:  Marin Olp, MD  Cardiologist:   None Referring:  ***  No chief complaint on file.     History of Present Illness: [name] [name] is a 60 y.o. male who is referred by *** for evaluation of syncope.  He is scheduled to have kidney stone surgery on 01/01/2021.      He had an echocardiogram in June that demonstrated well-preserved ejection fraction.  There were frequent PVCs.  ***   Past Medical History:  Diagnosis Date   Arthritis    ED (erectile dysfunction)    History of adenomatous polyp of colon    05-27-2015   History of gout    no issue since 2012   Hyperlipidemia    Hypertension    Microhematuria    Raynaud's syndrome    Renal calculus, right    Testicular hypogonadism    Wears glasses     Past Surgical History:  Procedure Laterality Date   BICEPS TENDON REPAIR Right 2003   COLONOSCOPY  05-27-2015   CYSTOSCOPY WITH URETEROSCOPY, STONE BASKETRY AND STENT PLACEMENT Right 09/27/2015   Procedure: 2ND STAGE CYSTOSCOPY RIGHT URETEROSCOPY STENT REPLACEMENT;  Surgeon: Alexis Frock, MD;  Location: [facility];  Service: Urology;  Laterality: Right;   CYSTOSCOPY/URETEROSCOPY/HOLMIUM LASER/STENT PLACEMENT Right 09/13/2015   Procedure: 1ST STAGE - CYSTOSCOPY/URETEROSCOPY/HOLMIUM LASER/STENT PLACEMENT;  Surgeon: Alexis Frock, MD;  Location: [facility];  Service: Urology;  Laterality: Right;   HOLMIUM LASER APPLICATION Right 5/57/3220   Procedure: HOLMIUM LASER APPLICATION;  Surgeon: Alexis Frock, MD;  Location: [facility];  Service: Urology;  Laterality: Right;   HOLMIUM LASER APPLICATION Right 2/54/2706   Procedure: HOLMIUM LASER;  Surgeon: Alexis Frock, MD;  Location: [facility];  Service: Urology;  Laterality: Right;   TOTAL SHOULDER ARTHROPLASTY Right 08/17/2013   RIGHT TOTAL  SHOULDER ARTHROPLASTY;  Surgeon: [name], MD;  Location: [place];  Service: Orthopedics;  Laterality: Right;   URETEROLITHOTOMY  2017     Current Outpatient Medications  Medication Sig Dispense Refill   amLODipine (NORVASC) 5 MG tablet Take 1 tablet (5 mg total) by mouth daily. 90 tablet 3   amoxicillin (AMOXIL) 500 MG capsule TAKE 4 CAPSULES BY MOUTH 1 HOUR PRIOR TO DENTAL APPT     atorvastatin (LIPITOR) 20 MG tablet Take 1 tablet (20 mg total) by mouth daily. 90 tablet 3   fluticasone (FLONASE) 50 MCG/ACT nasal spray Place 1 spray into both nostrils daily as needed.      sildenafil (REVATIO) 20 MG tablet Take 20 mg by mouth 3 (three) times daily.     No current facility-administered medications for this visit.    Allergies:   Lisinopril    Social History:  The patient  reports that he has been smoking cigarettes and cigars. He has a 40.00 pack-year smoking history. He has never used smokeless tobacco. He reports current alcohol use of about 2.0 - 4.0 standard drinks of alcohol per week. He reports that he does not use drugs.   Family History:  The patient's ***family history includes Coronary artery disease in his father; Diabetes in his father; Hyperlipidemia in his mother; Hypertension in his father and sister.    ROS:  Please see the history of present illness.   Otherwise, review of systems are positive for {NONE DEFAULTED:18576}.   All other systems are  reviewed and negative.    PHYSICAL EXAM: VS:  There were no vitals taken for this visit. , BMI There is no height or weight on file to calculate BMI. GENERAL:  Well appearing HEENT:  Pupils equal round and reactive, fundi not visualized, oral mucosa unremarkable NECK:  No jugular venous distention, waveform within normal limits, carotid upstroke brisk and symmetric, no bruits, no thyromegaly LYMPHATICS:  No cervical, inguinal adenopathy LUNGS:  Clear to auscultation bilaterally BACK:  No CVA tenderness CHEST:   Unremarkable HEART:  PMI not displaced or sustained,S1 and S2 within normal limits, no S3, no S4, no clicks, no rubs, *** murmurs ABD:  Flat, positive bowel sounds normal in frequency in pitch, no bruits, no rebound, no guarding, no midline pulsatile mass, no hepatomegaly, no splenomegaly EXT:  2 plus pulses throughout, no edema, no cyanosis no clubbing SKIN:  No rashes no nodules NEURO:  Cranial nerves II through XII grossly intact, motor grossly intact throughout PSYCH:  Cognitively intact, oriented to person place and time    EKG:  EKG {ACTION; IS/IS ZYY:[id number]} ordered today. The ekg ordered today demonstrates ***   Recent Labs: 03/08/2020: ALT 30 10/21/2020: BUN 13; Creatinine, Ser 1.13; Hemoglobin 16.4; Magnesium 1.8; Platelets 204.0; Potassium 4.5; Sodium 140    Lipid Panel    Component Value Date/Time   CHOL 147 03/08/2020 1617   TRIG 343 (H) 03/08/2020 1617   HDL 50 03/08/2020 1617   CHOLHDL 2.9 03/08/2020 1617   VLDL 21.6 12/02/2018 0952   LDLCALC 61 03/08/2020 1617   LDLDIRECT 85.0 11/30/2017 1409      Wt Readings from Last 3 Encounters:  10/21/20 180 lb 1.6 oz (81.7 kg)  03/08/20 182 lb (82.6 kg)  12/02/18 179 lb 9.6 oz (81.5 kg)      Other studies Reviewed: Additional studies/ records that were reviewed today include: ***. Review of the above records demonstrates:  Please see elsewhere in the note.  ***   ASSESSMENT AND PLAN:  PVCs:  ***  PREOP:  ***     Current medicines are reviewed at length with the patient today.  The patient {ACTIONS; HAS/DOES NOT HAVE:19233} concerns regarding medicines.  The following changes have been made:  {PLAN; NO CHANGE:13088:s}  Labs/ tests ordered today include: *** No orders of the defined types were placed in this encounter.    Disposition:   FU with ***    Signed, Minus Breeding, MD  12/25/2020 11:43 AM    [facility]

## 2020-12-26 ENCOUNTER — Ambulatory Visit (INDEPENDENT_AMBULATORY_CARE_PROVIDER_SITE_OTHER): Payer: No Typology Code available for payment source | Admitting: Cardiology

## 2020-12-26 ENCOUNTER — Encounter: Payer: Self-pay | Admitting: Cardiology

## 2020-12-26 ENCOUNTER — Telehealth (HOSPITAL_COMMUNITY): Payer: Self-pay | Admitting: *Deleted

## 2020-12-26 ENCOUNTER — Other Ambulatory Visit: Payer: Self-pay

## 2020-12-26 VITALS — BP 132/76 | HR 80 | Ht 69.0 in | Wt 174.4 lb

## 2020-12-26 DIAGNOSIS — R55 Syncope and collapse: Secondary | ICD-10-CM | POA: Diagnosis not present

## 2020-12-26 DIAGNOSIS — R0989 Other specified symptoms and signs involving the circulatory and respiratory systems: Secondary | ICD-10-CM

## 2020-12-26 NOTE — Telephone Encounter (Signed)
Close encounter 

## 2020-12-26 NOTE — Patient Instructions (Signed)
Medication Instructions:  Your physician recommends that you continue on your current medications as directed. Please refer to the Current Medication list given to you today.  *If you need a refill on your cardiac medications before your next appointment, please call your pharmacy*   Lab Work: None ordered.    Testing/Procedures: Your physician has requested that you have an exercise tolerance test. For further information please visit [email or web address]. Please also follow instruction sheet, as given.  Your physician has requested that you have a carotid duplex. This test is an ultrasound of the carotid arteries in your neck. It looks at blood flow through these arteries that supply the brain with blood. Allow one hour for this exam. There are no restrictions or special instructions.    Follow-Up: At [facility], you and your health needs are our priority.  As part of our continuing mission to provide you with exceptional heart care, we have created designated Provider Care Teams.  These Care Teams include your primary Cardiologist (physician) and Advanced Practice Providers (APPs -  Physician Assistants and Nurse Practitioners) who all work together to provide you with the care you need, when you need it.  We recommend signing up for the patient portal called "MyChart".  Sign up information is provided on this After Visit Summary.  MyChart is used to connect with patients for Virtual Visits (Telemedicine).  Patients are able to view lab/test results, encounter notes, upcoming appointments, etc.  Non-urgent messages can be sent to your provider as well.   To learn more about what you can do with MyChart, go to [email or web address].    Your next appointment:   3 month(s)  The format for your next appointment:   In Person  Provider:   Minus Breeding, MD

## 2020-12-26 NOTE — Progress Notes (Signed)
Please call patient and let them  know their  low dose Ct was read as a Lung RADS 1, negative study: no nodules or definitely benign nodules. Radiology recommendation is for a repeat LDCT in 12 months. .Please let them  know we will order and schedule their  annual screening scan for 12/2021. Please let them  know there was notation of CAD on their  scan.  Please remind the patient  that this is a non-gated exam therefore degree or severity of disease  cannot be determined. Please have them  follow up with their PCP regarding potential risk factor modification, dietary therapy or pharmacologic therapy if clinically indicated. Pt.  is  currently on statin therapy. Please place order for annual  screening scan for  12/2021 and fax results to PCP. Thanks so much. + CAD, follow up with PCP. Thanks so much

## 2020-12-26 NOTE — Progress Notes (Addendum)
Cardiology Office Note   Date:  12/26/2020   ID:  [name] [name], DOB 1961/01/11, MRN [id number]  PCP:  Marin Olp, MD  Cardiologist:   None Referring:  Marin Olp, MD  Chief Complaint  Patient presents with   Loss of Consciousness       History of Present Illness: [name] [name] is a 60 y.o. male who is referred by Marin Olp, MD for evaluation of syncope.  He is scheduled to have kidney stone surgery on 01/01/2021.    He said this happened in late April and I see some labs around April 18.  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  He was blowing some leaves.  He said he was wearing the leaf blower and his coworker saw him pass out face forward he said "like a tree falling."  He fell onto the soft green so he did not injure himself and he was only out for a second.  He had no prodrome.  He did not feel any palpitations.  He has not had any orthostatic symptoms.  He denies any chest discomfort, neck or arm discomfort.  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  He has had documented PVCs on recent EKG.  He wore an event monitor and he had 33% PVCs with occasional nonsustained runs.  The longest was run was 7 beats.  He had an echocardiogram which demonstrated a low normal ejection fraction of 50 to 55%.  There were no significant valvular abnormalities.  Of note despite the fact that his EKG suggests left and right atrial enlargement this was not noted.  I do note that this EKG also demonstrates some QT prolongation mildly.  He has left ventricular hypertrophy voltage.  I did compare this to previous and the atrial enlargement is new but the right axis deviation is not new.  QT prolongation is slightly down.  He is to have removal of kidney stones next week.  This is also preoperative clearance.  I do see that he has had some coronary calcium noted on CTs that he has had to screen for lung cancer.  He had a previous smoking  history.   Past Medical History:  Diagnosis Date   Arthritis    ED (erectile dysfunction)    History of adenomatous polyp of colon    05-27-2015   History of gout    no issue since 2012   Hyperlipidemia    Hypertension    Microhematuria    Raynaud's syndrome    Renal calculus, right    Testicular hypogonadism     Past Surgical History:  Procedure Laterality Date   BICEPS TENDON REPAIR Right 2003   COLONOSCOPY  05-27-2015   CYSTOSCOPY WITH URETEROSCOPY, STONE BASKETRY AND STENT PLACEMENT Right 09/27/2015   Procedure: 2ND STAGE CYSTOSCOPY RIGHT URETEROSCOPY STENT REPLACEMENT;  Surgeon: Alexis Frock, MD;  Location: [facility];  Service: Urology;  Laterality: Right;   CYSTOSCOPY/URETEROSCOPY/HOLMIUM LASER/STENT PLACEMENT Right 09/13/2015   Procedure: 1ST STAGE - CYSTOSCOPY/URETEROSCOPY/HOLMIUM LASER/STENT PLACEMENT;  Surgeon: Alexis Frock, MD;  [facility]  Service: Urology;  Laterality: Right;   HOLMIUM LASER APPLICATION Right 11/20/6158   Procedure: HOLMIUM LASER APPLICATION;  Surgeon: Alexis Frock, MD;  Location: [facility];  Service: Urology;  Laterality: Right;   HOLMIUM LASER APPLICATION Right 7/37/1062   Procedure: HOLMIUM LASER;  Surgeon: Alexis Frock, MD;  Location: [facility];  Service: Urology;  Laterality: Right;   TOTAL SHOULDER ARTHROPLASTY Right 08/17/2013   RIGHT TOTAL SHOULDER ARTHROPLASTY;  Surgeon: [name], MD;  Location: [facility];  Service: Orthopedics;  Laterality: Right;   URETEROLITHOTOMY  2017     Current Outpatient Medications  Medication Sig Dispense Refill   amLODipine (NORVASC) 5 MG tablet Take 1 tablet (5 mg total) by mouth daily. 90 tablet 3   amoxicillin (AMOXIL) 500 MG capsule TAKE 4 CAPSULES BY MOUTH 1 HOUR PRIOR TO DENTAL APPT     atorvastatin (LIPITOR) 20 MG tablet Take 1 tablet (20 mg total) by mouth daily. 90 tablet 3   fluticasone (FLONASE) 50 MCG/ACT nasal  spray Place 1 spray into both nostrils daily as needed.      sildenafil (REVATIO) 20 MG tablet Take 20 mg by mouth 3 (three) times daily.     No current facility-administered medications for this visit.    Allergies:   Lisinopril    Social History:  The patient  reports that he has been smoking cigarettes and cigars. He has a 40.00 pack-year smoking history. He has never used smokeless tobacco. He reports current alcohol use of about 2.0 - 4.0 standard drinks of alcohol per week. He reports that he does not use drugs.   Family History:  The patient's family history includes Coronary artery disease in his father; Diabetes in his father; Hyperlipidemia in his mother; Hypertension in his father and sister.    ROS:  Please see the history of present illness.   Otherwise, review of systems are positive for none.   All other systems are reviewed and negative.    PHYSICAL EXAM: VS:  BP 132/76 (BP Location: Left Arm, Patient Position: Sitting, Cuff Size: Normal)   Pulse 80   Ht 5\' 9"  (1.753 m)   Wt 174 lb 6.4 oz (79.1 kg)   SpO2 98%   BMI 25.75 kg/m  , BMI Body mass index is 25.75 kg/m. GENERAL:  Well appearing HEENT:  Pupils equal round and reactive, fundi not visualized, oral mucosa unremarkable NECK:  No jugular venous distention, waveform within normal limits, carotid upstroke brisk and symmetric, left bruit, no thyromegaly LYMPHATICS:  No cervical, inguinal adenopathy LUNGS:  Clear to auscultation bilaterally BACK:  No CVA tenderness CHEST:  Unremarkable HEART:  PMI not displaced or sustained,S1 and S2 within normal limits, no S3, no S4, no clicks, no rubs, no murmurs ABD:  Flat, positive bowel sounds normal in frequency in pitch, no bruits, no rebound, no guarding, no midline pulsatile mass, no hepatomegaly, no splenomegaly EXT:  2 plus pulses throughout, no edema, no cyanosis no clubbing SKIN:  No rashes no nodules NEURO:  Cranial nerves II through XII grossly intact, motor  grossly intact throughout PSYCH:  Cognitively intact, oriented to person place and time    EKG:  EKG is ordered today. The ekg ordered today demonstrates sinus rhythm, rate 80, right axis deviation, premature ventricular contractions, left and right atrial enlargement by criteria   Recent Labs: 03/08/2020: ALT 30 10/21/2020: BUN 13; Creatinine, Ser 1.13; Hemoglobin 16.4; Magnesium 1.8; Platelets 204.0; Potassium 4.5; Sodium 140    Lipid Panel    Component Value Date/Time   CHOL 147 03/08/2020 1617   TRIG 343 (H) 03/08/2020 1617   HDL 50 03/08/2020 1617   CHOLHDL 2.9 03/08/2020 1617   VLDL 21.6 12/02/2018 0952   LDLCALC 61 03/08/2020 1617   LDLDIRECT 85.0 11/30/2017 1409      Wt Readings from  Last 3 Encounters:  12/26/20 174 lb 6.4 oz (79.1 kg)  10/21/20 180 lb 1.6 oz (81.7 kg)  03/08/20 182 lb (82.6 kg)      Other studies Reviewed: Additional studies/ records that were reviewed today include: CT, echo, personal review of his monitor strips. Review of the above records demonstrates:  Please see elsewhere in the note.     ASSESSMENT AND PLAN:  SYNCOPE: I am concerned about the syncopal episode being arrhythmic.  However, 4-week monitor was unrevealing and has had no further symptoms.  I am going to bring him back for stress test.  He also has some carotid bruits as below we will have carotid Doppler.  I will discuss his case with EP to consider loop monitor.  Of note I did tell the patient that he cannot drive for 6 months following an unexplained syncope.  PVCs: As above  PREOP: The patient will have a POET (Plain Old Exercise Treadmill).  Based on the results of this I will consider preop clearance.  BRUIT: He needs carotid Doppler.  QT PROLONGED: This is mildly prolonged.  His magnesium is mildly low.  I will avoid QT prolonging drugs but I am not strongly suspecting QT syndrome  CORONARY CALCIUM: This will be evaluated as above.     Current medicines are  reviewed at length with the patient today.  The patient does not have concerns regarding medicines.  The following changes have been made:  no change  Labs/ tests ordered today include:   Orders Placed This Encounter  Procedures   EXERCISE TOLERANCE TEST (ETT)   EKG 12-Lead   VAS US CAROTID      Disposition:   FU with me in 2 months.     Signed, Minus Breeding, MD  12/26/2020 1:42 PM    [facility]

## 2020-12-27 ENCOUNTER — Encounter: Payer: Self-pay | Admitting: *Deleted

## 2020-12-27 ENCOUNTER — Other Ambulatory Visit: Payer: Self-pay | Admitting: *Deleted

## 2020-12-27 ENCOUNTER — Telehealth: Payer: Self-pay

## 2020-12-27 ENCOUNTER — Ambulatory Visit (HOSPITAL_COMMUNITY)
Admission: RE | Admit: 2020-12-27 | Discharge: 2020-12-27 | Disposition: A | Discharge status: Home / Self Care | Payer: No Typology Code available for payment source | Source: Ambulatory Visit | Attending: Cardiology | Admitting: Cardiology

## 2020-12-27 ENCOUNTER — Encounter (HOSPITAL_BASED_OUTPATIENT_CLINIC_OR_DEPARTMENT_OTHER): Payer: Self-pay | Admitting: Urology

## 2020-12-27 DIAGNOSIS — R55 Syncope and collapse: Secondary | ICD-10-CM | POA: Insufficient documentation

## 2020-12-27 DIAGNOSIS — F17210 Nicotine dependence, cigarettes, uncomplicated: Secondary | ICD-10-CM

## 2020-12-27 LAB — EXERCISE TOLERANCE TEST
Estimated workload: 11.2 METS
Exercise duration (min): 9 min
Exercise duration (sec): 41 s
MPHR: 162 {beats}/min
Peak HR: 142 {beats}/min
Percent HR: 87 %
Rest HR: 83 {beats}/min

## 2020-12-27 NOTE — Telephone Encounter (Signed)
Per staff message from Dr. Percival Spanish, this RN to order EP referral for patient.  Minus Breeding, MD  Odaly Peri, Gilles Chiquito, RN Can you please do a referral to Dr. Caryl Comes in EP new patient consult forsyncope.     Orders placed, Dr. Percival Spanish aware.

## 2020-12-30 ENCOUNTER — Telehealth: Payer: Self-pay | Admitting: Cardiology

## 2020-12-30 ENCOUNTER — Other Ambulatory Visit: Payer: Self-pay

## 2020-12-30 ENCOUNTER — Encounter (HOSPITAL_BASED_OUTPATIENT_CLINIC_OR_DEPARTMENT_OTHER): Payer: Self-pay | Admitting: Urology

## 2020-12-30 NOTE — Telephone Encounter (Signed)
See clearance note for 6/15 fax # .

## 2020-12-30 NOTE — Telephone Encounter (Signed)
[facility] is reaching out in regards to surgical clearance. Pts chart states that after PT has POET procedure done an update would be given for clearance.. no update found on the chart.. please advise surgery is in two days

## 2020-12-30 NOTE — Telephone Encounter (Addendum)
   Name: Othniel Maret  DOB: 07-21-1960  MRN: [id number]   Primary Cardiologist: Minus Breeding, MD  Chart reviewed as part of pre-operative protocol coverage. Patient was contacted 12/30/2020 in reference to pre-operative risk assessment for pending surgery as outlined below.  Zyeir Dymek was last seen on 12/26/20 by Dr. Percival Spanish.  He underwent an exercise stress test that was nonischemic. He is cleared for surgery. From a cardiovascular standpoint, he may hold ASA 5-7 days prior to surgery.   Therefore, based on ACC/AHA guidelines, the patient would be at acceptable risk for the planned procedure without further cardiovascular testing.   The patient was advised that if he develops new symptoms prior to surgery to contact our office to arrange for a follow-up visit, and he verbalized understanding.  I will route this recommendation to the requesting party via Epic fax function and remove from pre-op pool. Please call with questions.  [place], PA 12/30/2020, 2:07 PM

## 2020-12-30 NOTE — Progress Notes (Signed)
Spoke w/ via phone for pre-op interview--- Pt Lab needs dos----   Istat            Lab results------ current ekg in epic/ chart COVID test -----patient states asymptomatic no test needed Arrive at -------  0945 on 01-01-2021 NPO after MN NO Solid Food.  Clear liquids from MN until--- 0845 Med rec completed Medications to take morning of surgery ----- Lipitor, Norvasc Diabetic medication ----- n/a Patient instructed no nail polish to be worn day of surgery Patient instructed to bring photo id and insurance card day of surgery Patient aware to have Driver (ride ) / caregiver   for 24 hours after surgery -- wife, [name] [name] Patient Special Instructions ----- n/a Pre-Op special Istructions ----- pt has cardiac clearance by [name] hochrein telephone note 12-30-2020, in epic/ chart Patient verbalized understanding of instructions that were given at this phone interview. Patient denies shortness of breath, chest pain, fever, cough at this phone interview.    Anesthesia :  HTN;  PVCs (pt states asymptomatic);  pt recently had syncopal episode 04/ 2022, pcp referred pt to cardiology.  Pt had full work-up done including event monitor, echo, ETT all results in epic.  Pt denies cardiac s&s, sob, and no peripheral swelling.  Stated has not had any other near syncope/ syncope episode since.  PCP:  [name] [name] (10-21-2020 epic) Cardiologist : [name] [name] [facility] 12-26-2020 epic) Chest x-ray : chest CT 12-17-2020 epic EKG : 12-26-2020 Echo : 12-04-2020 Stress test: ETT 12-27-2020 Cardiac Cath :  no Activity level: denies sob w/ any activity Sleep Study/ CPAP :  no Blood Thinner/ Instructions Maryjane Hurter Dose: NO ASA / Instructions/ Last Dose :  NO

## 2020-12-30 NOTE — Telephone Encounter (Signed)
Pre pre op provider she has faxed clearance notes.

## 2021-01-01 ENCOUNTER — Ambulatory Visit (HOSPITAL_BASED_OUTPATIENT_CLINIC_OR_DEPARTMENT_OTHER)
Admission: RE | Admit: 2021-01-01 | Discharge: 2021-01-01 | Disposition: A | Discharge status: Home / Self Care | Payer: No Typology Code available for payment source | Attending: Urology | Admitting: Urology

## 2021-01-01 ENCOUNTER — Encounter (HOSPITAL_BASED_OUTPATIENT_CLINIC_OR_DEPARTMENT_OTHER)
Admission: RE | Disposition: A | Discharge status: Home / Self Care | Payer: Self-pay | Source: Home / Self Care | Attending: Urology

## 2021-01-01 ENCOUNTER — Encounter (HOSPITAL_BASED_OUTPATIENT_CLINIC_OR_DEPARTMENT_OTHER): Payer: Self-pay | Admitting: Urology

## 2021-01-01 ENCOUNTER — Ambulatory Visit (HOSPITAL_BASED_OUTPATIENT_CLINIC_OR_DEPARTMENT_OTHER): Payer: No Typology Code available for payment source | Admitting: Anesthesiology

## 2021-01-01 ENCOUNTER — Other Ambulatory Visit: Payer: Self-pay | Admitting: Urology

## 2021-01-01 ENCOUNTER — Other Ambulatory Visit: Payer: Self-pay

## 2021-01-01 DIAGNOSIS — Z86010 Personal history of colonic polyps: Secondary | ICD-10-CM | POA: Diagnosis not present

## 2021-01-01 DIAGNOSIS — Z8249 Family history of ischemic heart disease and other diseases of the circulatory system: Secondary | ICD-10-CM | POA: Insufficient documentation

## 2021-01-01 DIAGNOSIS — N200 Calculus of kidney: Secondary | ICD-10-CM | POA: Diagnosis present

## 2021-01-01 DIAGNOSIS — Z888 Allergy status to other drugs, medicaments and biological substances status: Secondary | ICD-10-CM | POA: Diagnosis not present

## 2021-01-01 DIAGNOSIS — F17210 Nicotine dependence, cigarettes, uncomplicated: Secondary | ICD-10-CM | POA: Diagnosis not present

## 2021-01-01 DIAGNOSIS — Z96611 Presence of right artificial shoulder joint: Secondary | ICD-10-CM | POA: Diagnosis not present

## 2021-01-01 HISTORY — PX: CYSTOSCOPY WITH RETROGRADE PYELOGRAM, URETEROSCOPY AND STENT PLACEMENT: SHX5789

## 2021-01-01 HISTORY — PX: HOLMIUM LASER APPLICATION: SHX5852

## 2021-01-01 LAB — POCT I-STAT, CHEM 8
BUN: 13 mg/dL (ref 6–20)
Calcium, Ion: 1.25 mmol/L (ref 1.15–1.40)
Chloride: 105 mmol/L (ref 98–111)
Creatinine, Ser: 0.9 mg/dL (ref 0.61–1.24)
Glucose, Bld: 118 mg/dL — ABNORMAL HIGH (ref 70–99)
HCT: 51 % (ref 39.0–52.0)
Hemoglobin: 17.3 g/dL — ABNORMAL HIGH (ref 13.0–17.0)
Potassium: 3.9 mmol/L (ref 3.5–5.1)
Sodium: 141 mmol/L (ref 135–145)
TCO2: 23 mmol/L (ref 22–32)

## 2021-01-01 SURGERY — CYSTOURETEROSCOPY, WITH RETROGRADE PYELOGRAM AND STENT INSERTION
Anesthesia: General | Site: Ureter | Laterality: Right

## 2021-01-01 MED ORDER — ACETAMINOPHEN 500 MG PO TABS
1000.0000 mg | ORAL_TABLET | Freq: Once | ORAL | Status: AC
  Administered 2021-01-01: 1000 mg via ORAL

## 2021-01-01 MED ORDER — PROPOFOL 10 MG/ML IV BOLUS
INTRAVENOUS | Status: DC | PRN
  Administered 2021-01-01: 200 mg via INTRAVENOUS

## 2021-01-01 MED ORDER — EPHEDRINE 5 MG/ML INJ
INTRAVENOUS | Status: AC
  Filled 2021-01-01: qty 10

## 2021-01-01 MED ORDER — MIDAZOLAM HCL 2 MG/2ML IJ SOLN
INTRAMUSCULAR | Status: DC | PRN
  Administered 2021-01-01: 1 mg via INTRAVENOUS

## 2021-01-01 MED ORDER — KETOROLAC TROMETHAMINE 30 MG/ML IJ SOLN
INTRAMUSCULAR | Status: DC | PRN
  Administered 2021-01-01: 30 mg via INTRAVENOUS

## 2021-01-01 MED ORDER — LIDOCAINE HCL (CARDIAC) PF 100 MG/5ML IV SOSY
PREFILLED_SYRINGE | INTRAVENOUS | Status: DC | PRN
  Administered 2021-01-01: 50 mg via INTRAVENOUS

## 2021-01-01 MED ORDER — FENTANYL CITRATE (PF) 100 MCG/2ML IJ SOLN
INTRAMUSCULAR | Status: AC
  Filled 2021-01-01: qty 2

## 2021-01-01 MED ORDER — OXYCODONE-ACETAMINOPHEN 5-325 MG PO TABS: 1.0000 | ORAL_TABLET | Freq: Four times a day (QID) | ORAL | 0 refills | Status: DC | PRN

## 2021-01-01 MED ORDER — SODIUM CHLORIDE 0.9 % IR SOLN
Status: DC | PRN
  Administered 2021-01-01: 3000 mL via INTRAVESICAL

## 2021-01-01 MED ORDER — MIDAZOLAM HCL 2 MG/2ML IJ SOLN
INTRAMUSCULAR | Status: AC
  Filled 2021-01-01: qty 2

## 2021-01-01 MED ORDER — 0.9 % SODIUM CHLORIDE (POUR BTL) OPTIME
TOPICAL | Status: DC | PRN
  Administered 2021-01-01: 500 mL

## 2021-01-01 MED ORDER — GENTAMICIN SULFATE 40 MG/ML IJ SOLN
400.0000 mg | INTRAVENOUS | Status: AC
  Administered 2021-01-01: 400 mg via INTRAVENOUS
  Filled 2021-01-01: qty 10

## 2021-01-01 MED ORDER — GLYCOPYRROLATE 0.2 MG/ML IJ SOLN
INTRAMUSCULAR | Status: DC | PRN
  Administered 2021-01-01 (×2): .1 mg via INTRAVENOUS

## 2021-01-01 MED ORDER — IOHEXOL 300 MG/ML  SOLN
INTRAMUSCULAR | Status: DC | PRN
  Administered 2021-01-01: 5 mL via URETHRAL

## 2021-01-01 MED ORDER — ACETAMINOPHEN 500 MG PO TABS
ORAL_TABLET | ORAL | Status: AC
  Filled 2021-01-01: qty 2

## 2021-01-01 MED ORDER — FENTANYL CITRATE (PF) 100 MCG/2ML IJ SOLN
INTRAMUSCULAR | Status: DC | PRN
  Administered 2021-01-01 (×2): 25 ug via INTRAVENOUS
  Administered 2021-01-01: 50 ug via INTRAVENOUS

## 2021-01-01 MED ORDER — SENNOSIDES-DOCUSATE SODIUM 8.6-50 MG PO TABS: 1.0000 | ORAL_TABLET | Freq: Two times a day (BID) | ORAL | 0 refills | Status: DC

## 2021-01-01 MED ORDER — KETOROLAC TROMETHAMINE 30 MG/ML IJ SOLN
INTRAMUSCULAR | Status: AC
  Filled 2021-01-01: qty 1

## 2021-01-01 MED ORDER — GLYCOPYRROLATE PF 0.2 MG/ML IJ SOSY
PREFILLED_SYRINGE | INTRAMUSCULAR | Status: AC
  Filled 2021-01-01: qty 1

## 2021-01-01 MED ORDER — FENTANYL CITRATE (PF) 100 MCG/2ML IJ SOLN: 25.0000 ug | INTRAMUSCULAR | Status: DC | PRN

## 2021-01-01 MED ORDER — DEXAMETHASONE SODIUM PHOSPHATE 4 MG/ML IJ SOLN
INTRAMUSCULAR | Status: DC | PRN
  Administered 2021-01-01: 8 mg via INTRAVENOUS

## 2021-01-01 MED ORDER — LACTATED RINGERS IV SOLN: INTRAVENOUS | Status: DC

## 2021-01-01 MED ORDER — ONDANSETRON HCL 4 MG/2ML IJ SOLN
INTRAMUSCULAR | Status: DC | PRN
  Administered 2021-01-01: 4 mg via INTRAVENOUS

## 2021-01-01 MED ORDER — KETOROLAC TROMETHAMINE 10 MG PO TABS: 10.0000 mg | ORAL_TABLET | Freq: Three times a day (TID) | ORAL | 0 refills | Status: DC | PRN

## 2021-01-01 SURGICAL SUPPLY — 28 items
BAG DRAIN URO-CYSTO SKYTR STRL (DRAIN) ×2 IMPLANT
BAG DRN UROCATH (DRAIN) ×1
BASKET LASER NITINOL 1.9FR (BASKET) IMPLANT
BSKT STON RTRVL 120 1.9FR (BASKET)
CATH INTERMIT  6FR 70CM (CATHETERS) ×2 IMPLANT
CLOTH BEACON ORANGE TIMEOUT ST (SAFETY) ×2 IMPLANT
COVER DOME SNAP 22 D (MISCELLANEOUS) ×2 IMPLANT
FIBER LASER FLEXIVA 365 (UROLOGICAL SUPPLIES) IMPLANT
GLOVE SURG ENC MOIS LTX SZ7.5 (GLOVE) ×2 IMPLANT
GLOVE SURG UNDER POLY LF SZ7 (GLOVE) ×4 IMPLANT
GOWN STRL REUS W/TWL LRG LVL3 (GOWN DISPOSABLE) ×6 IMPLANT
GUIDEWIRE ANG ZIPWIRE 038X150 (WIRE) ×2 IMPLANT
GUIDEWIRE STR DUAL SENSOR (WIRE) ×2 IMPLANT
IV NS 1000ML (IV SOLUTION)
IV NS 1000ML BAXH (IV SOLUTION) IMPLANT
IV NS IRRIG 3000ML ARTHROMATIC (IV SOLUTION) ×2 IMPLANT
KIT TURNOVER CYSTO (KITS) ×2 IMPLANT
MANIFOLD NEPTUNE II (INSTRUMENTS) ×2 IMPLANT
NS IRRIG 500ML POUR BTL (IV SOLUTION) ×2 IMPLANT
PACK CYSTO (CUSTOM PROCEDURE TRAY) ×2 IMPLANT
SHEATH URETERAL 12FRX35CM (MISCELLANEOUS) ×2 IMPLANT
STENT POLARIS 5FRX26 (STENTS) ×2 IMPLANT
SYR 10ML LL (SYRINGE) ×2 IMPLANT
TRACTIP FLEXIVA PULS ID 200XHI (Laser) ×1 IMPLANT
TRACTIP FLEXIVA PULSE ID 200 (Laser) ×2
TUBE CONNECTING 12X1/4 (SUCTIONS) ×2 IMPLANT
TUBE FEEDING 8FR 16IN STR KANG (MISCELLANEOUS) ×2 IMPLANT
TUBING UROLOGY SET (TUBING) ×2 IMPLANT

## 2021-01-01 NOTE — Anesthesia Postprocedure Evaluation (Signed)
Anesthesia Post Note  Patient: Ovadia Lopp  Procedure(s) Performed: CYSTOSCOPY WITH RETROGRADE PYELOGRAM, URETEROSCOPY AND STENT PLACEMENT (Right: Ureter) 1ST STAGE HOLMIUM LASER APPLICATION (Right: Ureter)     Patient location during evaluation: PACU Anesthesia Type: General Level of consciousness: awake and alert Pain management: pain level controlled Vital Signs Assessment: post-procedure vital signs reviewed and stable Respiratory status: spontaneous breathing, nonlabored ventilation and respiratory function stable Cardiovascular status: blood pressure returned to baseline and stable Postop Assessment: no apparent nausea or vomiting Anesthetic complications: no   No notable events documented.  Last Vitals:  Vitals:   01/01/21 1400 01/01/21 1415  BP: (!) 154/88 (!) 159/107  Pulse: 70 (!) 109  Resp: 10 17  Temp:    SpO2: 94% 93%    Last Pain:  Vitals:   01/01/21 1400  PainSc: 0-No pain                 Richards Pherigo,W. EDMOND

## 2021-01-01 NOTE — Anesthesia Preprocedure Evaluation (Addendum)
Anesthesia Evaluation  Patient identified by MRN, date of birth, ID band Patient awake    Reviewed: Allergy & Precautions, H&P , NPO status , Patient's Chart, lab work & pertinent test results  Airway Mallampati: II  TM Distance: >3 FB Neck ROM: Full    Dental no notable dental hx. (+) Teeth Intact, Dental Advisory Given   Pulmonary Current Smoker and Patient abstained from smoking.,    Pulmonary exam normal breath sounds clear to auscultation       Cardiovascular hypertension, Pt. on medications  Rhythm:Regular Rate:Normal     Neuro/Psych negative neurological ROS  negative psych ROS   GI/Hepatic negative GI ROS, Neg liver ROS,   Endo/Other  negative endocrine ROS  Renal/GU Renal disease  negative genitourinary   Musculoskeletal  (+) Arthritis , Osteoarthritis,    Abdominal   Peds  Hematology negative hematology ROS (+)   Anesthesia Other Findings   Reproductive/Obstetrics negative OB ROS                            Anesthesia Physical Anesthesia Plan  ASA: 2  Anesthesia Plan: General   Post-op Pain Management:    Induction: Intravenous  PONV Risk Score and Plan: 2 and Ondansetron, Dexamethasone and Midazolam  Airway Management Planned: LMA  Additional Equipment:   Intra-op Plan:   Post-operative Plan: Extubation in OR  Informed Consent: I have reviewed the patients History and Physical, chart, labs and discussed the procedure including the risks, benefits and alternatives for the proposed anesthesia with the patient or authorized representative who has indicated his/her understanding and acceptance.     Dental advisory given  Plan Discussed with: CRNA  Anesthesia Plan Comments:         Anesthesia Quick Evaluation

## 2021-01-01 NOTE — Discharge Instructions (Addendum)
1 - You may have urinary urgency (bladder spasms) and bloody urine on / off with stent in place. This is normal.  2 - Call MD or go to ER for fever >102, severe pain / nausea / vomiting not relieved by medications, or acute change in medical status [facility] [phone] Post Ureteroscopy With Stent Instructions  Definitions:  Ureter: The duct that transports urine from the kidney to the bladder. Stent:   A plastic hollow tube that is placed into the ureter, from the kidney to the bladder to prevent the ureter from swelling shut.  GENERAL INSTRUCTIONS:  Despite the fact that no skin incisions were used, the area around the ureter and bladder is raw and irritated. The stent is a foreign body which will further irritate the bladder wall. This irritation is manifested by increased frequency of urination, both day and night, and by an increase in the urge to urinate. In some, the urge to urinate is present almost always. Sometimes the urge is strong enough that you may not be able to stop yourself from urinating. The only real cure is to remove the stent and then give time for the bladder wall to heal which can't be done until the danger of the ureter swelling shut has passed, which varies.  You may see some blood in your urine while the stent is in place and a few days afterwards. Do not be alarmed, even if the urine was clear for a while. Get off your feet and drink lots of fluids until clearing occurs. If you start to pass clots or don't improve, call us.  DIET: You may return to your normal diet immediately. Because of the raw surface of your bladder, alcohol, spicy foods, acid type foods and drinks with caffeine may cause irritation or frequency and should be used in moderation. To keep your urine flowing freely and to avoid constipation, drink plenty of fluids during the day ( 8-10 glasses ). Tip: Avoid cranberry juice because it is very acidic.  ACTIVITY: Your physical  activity doesn't need to be restricted. However, if you are very active, you may see some blood in your urine. We suggest that you reduce your activity under these circumstances until the bleeding has stopped.  BOWELS: It is important to keep your bowels regular during the postoperative period. Straining with bowel movements can cause bleeding. A bowel movement every other day is reasonable. Use a mild laxative if needed, such as Milk of Magnesia 2-3 tablespoons, or 2 Dulcolax tablets. Call if you continue to have problems. If you have been taking narcotics for pain, before, during or after your surgery, you may be constipated. Take a laxative if necessary.   MEDICATION: You should resume your pre-surgery medications unless told not to. In addition you will often be given an antibiotic to prevent infection. These should be taken as prescribed until the bottles are finished unless you are having an unusual reaction to one of the drugs.  PROBLEMS YOU SHOULD REPORT TO [place]: Fevers over 100.5 Fahrenheit. Heavy bleeding, or clots ( See above notes about blood in urine ). Inability to urinate. Drug reactions ( hives, rash, nausea, vomiting, diarrhea ). Severe burning or pain with urination that is not improving.  FOLLOW-UP: You will need a follow-up appointment to monitor your progress. Call for this appointment at the number listed above. Usually the first appointment will be about three to fourteen days after your surgery.   Post Anesthesia Home Care Instructions  Activity:  Get plenty of rest for the remainder of the day. A responsible individual must stay with you for 24 hours following the procedure.  For the next 24 hours, DO NOT: -Drive a car -Paediatric nurse -Drink alcoholic beverages -Take any medication unless instructed by your physician -Make any legal decisions or sign important papers.  Meals: Start with liquid foods such as gelatin or soup. Progress to regular foods as  tolerated. Avoid greasy, spicy, heavy foods. If nausea and/or vomiting occur, drink only clear liquids until the nausea and/or vomiting subsides. Call your physician if vomiting continues.  Special Instructions/Symptoms: Your throat may feel dry or sore from the anesthesia or the breathing tube placed in your throat during surgery. If this causes discomfort, gargle with warm salt water. The discomfort should disappear within 24 hours.    Tylenol given at 10:21 before surgery. May take next dose at 4:30 PM if needed. Be mindful of taking with Percocet as there is Tylenol in this medication.

## 2021-01-01 NOTE — Transfer of Care (Signed)
Immediate Anesthesia Transfer of Care Note  Patient: [name] [name]  Procedure(s) Performed: CYSTOSCOPY WITH RETROGRADE PYELOGRAM, URETEROSCOPY AND STENT PLACEMENT (Right: Ureter) 1ST STAGE HOLMIUM LASER APPLICATION (Right: Ureter)  Patient Location: PACU  Anesthesia Type:General  Level of Consciousness: awake, alert , oriented and patient cooperative  Airway & Oxygen Therapy: Patient Spontanous Breathing and Patient connected to nasal cannula oxygen  Post-op Assessment: Report given to RN and Post -op Vital signs reviewed and stable  Post vital signs: Reviewed and stable  Last Vitals:  Vitals Value Taken Time  BP 143/131 01/01/21 1332  Temp    Pulse 39 01/01/21 1334  Resp 17 01/01/21 1335  SpO2 98 % 01/01/21 1334  Vitals shown include unvalidated device data.  Last Pain: There were no vitals filed for this visit.    Patients Stated Pain Goal: 6 (91/98/02 2179)  Complications: No notable events documented.

## 2021-01-01 NOTE — Brief Op Note (Signed)
01/01/2021  1:18 PM  PATIENT:  Aundra Dubin  60 y.o. male  PRE-OPERATIVE DIAGNOSIS:  RIGHT RENAL STONE  POST-OPERATIVE DIAGNOSIS:  RIGHT RENAL STONE  PROCEDURE:  Procedure(s) with comments: CYSTOSCOPY WITH RETROGRADE PYELOGRAM, URETEROSCOPY AND STENT PLACEMENT (Right) - 90 MINS 1ST STAGE HOLMIUM LASER APPLICATION (Right)  SURGEON:  Surgeon(s) and Role:    Alexis Frock, MD - Primary  PHYSICIAN ASSISTANT:   ASSISTANTS: none   ANESTHESIA:   general  EBL:  minimal   BLOOD ADMINISTERED:none  DRAINS: none   LOCAL MEDICATIONS USED:  NONE  SPECIMEN:  Source of Specimen:  none  DISPOSITION OF SPECIMEN:  N/A  COUNTS:  YES  TOURNIQUET:  * No tourniquets in log *  DICTATION: .Other Dictation: Dictation Number [id number]  PLAN OF CARE: Discharge to home after PACU  PATIENT DISPOSITION:  PACU - hemodynamically stable.   Delay start of Pharmacological VTE agent (>24hrs) due to surgical blood loss or risk of bleeding: not applicable

## 2021-01-01 NOTE — Anesthesia Procedure Notes (Signed)
Procedure Name: LMA Insertion Date/Time: 01/01/2021 11:58 AM Performed by: Georgeanne Nim, CRNA Pre-anesthesia Checklist: Patient identified, Emergency Drugs available, Suction available, Patient being monitored and Timeout performed Patient Re-evaluated:Patient Re-evaluated prior to induction Oxygen Delivery Method: Circle system utilized Preoxygenation: Pre-oxygenation with 100% oxygen Induction Type: IV induction Ventilation: Mask ventilation without difficulty LMA: LMA inserted LMA Size: 4.0 Placement Confirmation: positive ETCO2, breath sounds checked- equal and bilateral and CO2 detector Tube secured with: Tape Dental Injury: Teeth and Oropharynx as per pre-operative assessment

## 2021-01-01 NOTE — H&P (Signed)
[name] [name] is an 60 y.o. male.    Chief Complaint: Pre-Op RIGHT Ureteroscopic Stone Manipulation  HPI:   1 -  Nephrolithiasis - s/p staged right ureteroscopic stone manipulation 09/2015 for 2cm renal stone. Composition 100% CaOx. BMP normal. Has had medical passage of several very small stone as well.   Recent Surveillance:  08/2017 - KUB, RUS - punctate Rt papillary tip calcs only.  09/2018 - KUB, RUS - no stones by KUB, passed small stone in last year  10/2019 - KUB - ? Left pap tip only.  11/2020 - KUB, RUS - New significant volume Rt stone 1cm x several withotu hydro by [place] (not visible on KUB)    PMH sig for HLD, Otho surgery, Gout. No strong blood thinners. His PCP is Garret Reddish MD with [facility].    Today " DP " is seen to proceed with RIGHT ureteroscopic stone manipulation for recurrent renal stones. No interval fevers. Most recetn UA without infectious parameters.   Past Medical History:  Diagnosis Date   Arthritis    ED (erectile dysfunction)    History of adenomatous polyp of colon    05-27-2015   History of gout    no issue since 2012   History of syncope 10/2020   pcp referred pt to cardiology, seen by dr hochrein, note 12-26-2020 in epic;  work-up includes event monitor 12-03-2020 showed 33% PVCs w/ occ nonsustained VT runs,  echo 12-04-2020 ef 55-55% freq PVCs GiDD,  ETT 12-27-2020 showed negative for ischemia   Hyperlipidemia    Hypertension    Raynaud's syndrome    Renal calculus, right    Testicular hypogonadism    Wears glasses     Past Surgical History:  Procedure Laterality Date   BICEPS TENDON REPAIR Right 2003   COLONOSCOPY  05/27/2015   CYSTOSCOPY WITH URETEROSCOPY, STONE BASKETRY AND STENT PLACEMENT Right 09/27/2015   Procedure: 2ND STAGE CYSTOSCOPY RIGHT URETEROSCOPY STENT REPLACEMENT;  Surgeon: Alexis Frock, MD;  Location: [facility];  Service: Urology;  Laterality: Right;   CYSTOSCOPY/URETEROSCOPY/HOLMIUM  LASER/STENT PLACEMENT Right 09/13/2015   Procedure: 1ST STAGE - CYSTOSCOPY/URETEROSCOPY/HOLMIUM LASER/STENT PLACEMENT;  Surgeon: Alexis Frock, MD;  Location: [facility];  Service: Urology;  Laterality: Right;   HOLMIUM LASER APPLICATION Right 16/04/9603   Procedure: HOLMIUM LASER APPLICATION;  Surgeon: Alexis Frock, MD;  Location: [facility];  Service: Urology;  Laterality: Right;   HOLMIUM LASER APPLICATION Right 54/03/8118   Procedure: HOLMIUM LASER;  Surgeon: Alexis Frock, MD;  Location: [facility];  Service: Urology;  Laterality: Right;   TOTAL SHOULDER ARTHROPLASTY Right 08/17/2013   RIGHT TOTAL SHOULDER ARTHROPLASTY;  Surgeon: [name], MD;  Location: [place];  Service: Orthopedics;  Laterality: Right;    Family History  Problem Relation Age of Onset   Coronary artery disease Father        CABG 26, former smoker   Diabetes Father    Hypertension Father    Hyperlipidemia Mother        trig   Hypertension Sister        HLD   Colon cancer Neg Hx    Esophageal cancer Neg Hx    Rectal cancer Neg Hx    Stomach cancer Neg Hx    Prostate cancer Neg Hx    Social History:  reports that he has been smoking cigarettes and cigars. He has a 40.00 pack-year smoking history. He has never used smokeless tobacco. He reports current  alcohol use of about 2.0 - 4.0 standard drinks of alcohol per week. He reports that he does not use drugs.  Allergies:  Allergies  Allergen Reactions   Lisinopril Cough    No medications prior to admission.    No results found for this or any previous visit (from the past 48 hour(s)). No results found.  Review of Systems  Constitutional:  Negative for chills and fever.  All other systems reviewed and are negative.  Height 5\' 9"  (1.753 m), weight 79.1 kg. Physical Exam Vitals reviewed.  HENT:     Head: Normocephalic.     Nose: Nose normal.     Mouth/Throat:     Mouth: Mucous membranes are  moist.  Eyes:     Pupils: Pupils are equal, round, and reactive to light.  Cardiovascular:     Rate and Rhythm: Normal rate.  Pulmonary:     Effort: Pulmonary effort is normal.  Abdominal:     General: Abdomen is flat.  Genitourinary:    Comments: No CVAT at present Musculoskeletal:     Cervical back: Normal range of motion.  Neurological:     General: No focal deficit present.     Mental Status: He is alert.  Psychiatric:        Mood and Affect: Mood normal.     Assessment/Plan  Proceed as planned with RIGHT ureteroscopic stone manipulation. Risks, benefits, alternatives, expected peri-op course discussed previously and reiterated today including need for possible staged approach given stone volume.   Alexis Frock, MD 01/01/2021, 7:15 AM

## 2021-01-01 NOTE — Op Note (Signed)
NAMEVOSHON, PETRO MEDICAL RECORD NO: [id number] ACCOUNT NO: [id number] DATE OF BIRTH: 1961/06/17 FACILITY: [place] LOCATION: [facility] PHYSICIAN: Alexis Frock, MD  Operative Report   DATE OF PROCEDURE: 01/01/2021  PREOPERATIVE DIAGNOSIS:  Large volume right recurrent renal stone.  PROCEDURES:    1.  Cystoscopy with right retrograde pyelogram and interpretation. 2.  Right ureteroscopy with laser lithotripsy, first stage. 3.  Insertion of right ureteral stent 5 x 26 Polaris, no tether.  ESTIMATED BLOOD LOSS:  Nil.  COMPLICATIONS:  None.  SPECIMEN:  None.  FINDINGS:    1.  Very large volume recurrent right renal stone near staghorn. 2.  Ablation of approximately 60% stone volume with first stage procedure. 3.  Successful placement of right ureteral stent, proximal end in renal pelvis, distal end in urinary bladder.  INDICATIONS:  The patient is a very pleasant 60 year old male with a longstanding history of recurrent urolithiasis.  He had a staged procedure for right-sided stone several years ago and has been on surveillance since then. He has done quite well with  minimal stone volume; however, this year, on surveillance imaging, he was found to have significant volume recurrence with at least 2 cm recurrent stone volume on the right side, however, no obstruction.  Options were discussed including continued  surveillance versus recommended path of ureteroscopy with goal of stone free before progression to obstruction given the stone volume.  We discussed beforehand that this very well may require a staged approach.  Informed consent was obtained and placed  in medical record.  DESCRIPTION OF PROCEDURE:  The patient being Schawn Byas verified.  Procedure being right ureteroscopic stone manipulation was confirmed.  Procedure timeout was performed.  Intravenous antibiotics were administered.  General anesthesia was  induced.  The patient was placed in the low lithotomy  position.  Sterile field was created prepping and draping the patient's penis, perineum, and proximal thigh using iodine.  Cystourethroscopy was performed using 21-French rigid cystoscope with offset  lens.  Inspection of the anterior and posterior urethra was unremarkable.  Inspection of the urinary bladder revealed no diverticula, calcifications, or papillary lesions.  The right ureteral orifice was cannulated with a 6-French renal catheter, and  right retrograde pyelogram was obtained.  Right retrograde pyelogram demonstrated a single right ureter, single system right kidney.  There was minimal amount of renal pelvis seen. This was somewhat concerning for possible large volume stone.  A 0.038 ZIPwire was advanced to the level of the  lower pole, set aside as a safety wire.  An 8-French feeding tube was placed in the urinary bladder for pressure release.  A semi-rigid ureteroscopy was performed of the distal two-thirds of right ureter alongside a separate sensor working wire, and no  mucosal abnormalities were found.  The semi-rigid scope was exchanged for a 12/14 medium length ureteral access sheath to the level of proximal ureter using continuous fluoroscopic guidance and flexible digital ureteroscopy was performed of the proximal  ureter and right kidney. I suspected there was a very large volume recurrent right renal stone nearly complete staghorn, however, no obstruction.  Photodocumentation performed. As such, Holmium laser energy applied using a setting of 0.2 joules and 20 Hz  escalating to 1 joule and 5 Hz.  Using a dusting technique, approximately 60% of the stone volume was cleared out.  Notably, the entire renal pelvis was cleared and some progress on upper and lower infundibula.  After approximately an hour and half of  laser lithotripsy, they inherently generated so much  stone dust and debris that visualization became quite poor.  It was clearly felt that a staged approach would be most  advantageous both for safety sake and completeness. As such, the access sheath was  removed under continuous vision.  No significant mucosal abnormalities were found.  A new 5 x 26 Polaris type stent was placed over the remaining safety wire using fluoroscopic guidance.  Good proximal and distal planes were noted.   The procedure was  terminated.  The patient tolerated the procedure well with no immediate perioperative complications.  The patient was taken to postanesthesia care unit in stable condition.  PLAN: Discharge home.  We will schedule second stage procedure in likely 1-3 weeks to continue progression on rendering him right-sided stone free.   ROH D: 01/01/2021 1:23:50 pm T: 01/01/2021 9:53:00 pm  JOB: [id number]/ [id number]

## 2021-01-03 ENCOUNTER — Encounter (HOSPITAL_BASED_OUTPATIENT_CLINIC_OR_DEPARTMENT_OTHER): Payer: Self-pay | Admitting: Urology

## 2021-01-08 ENCOUNTER — Encounter (HOSPITAL_BASED_OUTPATIENT_CLINIC_OR_DEPARTMENT_OTHER): Payer: Self-pay | Admitting: Urology

## 2021-01-08 ENCOUNTER — Other Ambulatory Visit: Payer: Self-pay

## 2021-01-08 NOTE — Progress Notes (Addendum)
Spoke w/ via phone for pre-op interview--- Pt Lab needs dos----   Istat            Lab results------ current ekg in epic/ chart COVID test -----patient states asymptomatic no test needed Arrive at -------  0830 on 01-10-2021 NPO after MN NO Solid Food.  Clear liquids from MN until--- 0730 Med rec completed Medications to take morning of surgery ----- Lipitor, Norvasc Diabetic medication ----- n/a Patient instructed no nail polish to be worn day of surgery Patient instructed to bring photo id and insurance card day of surgery Patient aware to have Driver (ride ) / caregiver   for 24 hours after surgery -- wife, Colletta [place] Patient Special Instructions ----- n/a Pre-Op special Istructions ----- pt has cardiac clearance by dr hochrein telephone note 12-30-2020, in epic, this was for 01-01-2021 surgery Patient verbalized understanding of instructions that were given at this phone interview. Patient denies shortness of breath, chest pain, fever, cough at this phone interview.    Anesthesia :  HTN;  PVCs (pt states asymptomatic);  pt recently had syncopal episode 04/ 2022, pcp referred pt to cardiology.  Pt had full work-up done including event monitor, echo, ETT all results in epic.  Pt denies cardiac s&s, sob, and no peripheral swelling.  Stated has not had any other near syncope/ syncope episode since.  On 01-08-2021 per pt no change's in health since surgery @ [facility] 01-01-2021.  PCP:  Dr Chauncey Cruel. Hunter (10-21-2020 epic) Cardiologist : Dr Percival Spanish [facility] 12-26-2020 epic) Chest x-ray : chest CT 12-17-2020 epic EKG : 12-26-2020 Echo : 12-04-2020 Stress test: ETT 12-27-2020 Cardiac Cath :  no Activity level: denies sob w/ any activity Sleep Study/ CPAP :  no Blood Thinner/ Instructions Maryjane Hurter Dose: NO ASA / Instructions/ Last Dose :  NO

## 2021-01-09 MED ORDER — GENTAMICIN SULFATE 40 MG/ML IJ SOLN
5.0000 mg/kg | Freq: Once | INTRAVENOUS | Status: AC
  Administered 2021-01-10: 390 mg via INTRAVENOUS
  Filled 2021-01-09: qty 9.75

## 2021-01-10 ENCOUNTER — Encounter (HOSPITAL_BASED_OUTPATIENT_CLINIC_OR_DEPARTMENT_OTHER)
Admission: RE | Disposition: A | Discharge status: Home / Self Care | Payer: Self-pay | Source: Home / Self Care | Attending: Urology

## 2021-01-10 ENCOUNTER — Ambulatory Visit (HOSPITAL_BASED_OUTPATIENT_CLINIC_OR_DEPARTMENT_OTHER): Payer: No Typology Code available for payment source | Admitting: Anesthesiology

## 2021-01-10 ENCOUNTER — Encounter (HOSPITAL_BASED_OUTPATIENT_CLINIC_OR_DEPARTMENT_OTHER): Payer: Self-pay | Admitting: Urology

## 2021-01-10 ENCOUNTER — Ambulatory Visit (HOSPITAL_BASED_OUTPATIENT_CLINIC_OR_DEPARTMENT_OTHER)
Admission: RE | Admit: 2021-01-10 | Discharge: 2021-01-10 | Disposition: A | Discharge status: Home / Self Care | Payer: No Typology Code available for payment source | Attending: Urology | Admitting: Urology

## 2021-01-10 DIAGNOSIS — F17290 Nicotine dependence, other tobacco product, uncomplicated: Secondary | ICD-10-CM | POA: Insufficient documentation

## 2021-01-10 DIAGNOSIS — Z833 Family history of diabetes mellitus: Secondary | ICD-10-CM | POA: Diagnosis not present

## 2021-01-10 DIAGNOSIS — Z87442 Personal history of urinary calculi: Secondary | ICD-10-CM | POA: Insufficient documentation

## 2021-01-10 DIAGNOSIS — N200 Calculus of kidney: Secondary | ICD-10-CM | POA: Diagnosis not present

## 2021-01-10 DIAGNOSIS — Z888 Allergy status to other drugs, medicaments and biological substances status: Secondary | ICD-10-CM | POA: Diagnosis not present

## 2021-01-10 DIAGNOSIS — I7300 Raynaud's syndrome without gangrene: Secondary | ICD-10-CM | POA: Insufficient documentation

## 2021-01-10 DIAGNOSIS — Z8249 Family history of ischemic heart disease and other diseases of the circulatory system: Secondary | ICD-10-CM | POA: Diagnosis not present

## 2021-01-10 DIAGNOSIS — Z8349 Family history of other endocrine, nutritional and metabolic diseases: Secondary | ICD-10-CM | POA: Diagnosis not present

## 2021-01-10 DIAGNOSIS — F17210 Nicotine dependence, cigarettes, uncomplicated: Secondary | ICD-10-CM | POA: Diagnosis not present

## 2021-01-10 DIAGNOSIS — I10 Essential (primary) hypertension: Secondary | ICD-10-CM | POA: Diagnosis not present

## 2021-01-10 HISTORY — PX: HOLMIUM LASER APPLICATION: SHX5852

## 2021-01-10 HISTORY — PX: CYSTOSCOPY WITH RETROGRADE PYELOGRAM, URETEROSCOPY AND STENT PLACEMENT: SHX5789

## 2021-01-10 LAB — POCT I-STAT, CHEM 8
BUN: 15 mg/dL (ref 6–20)
Calcium, Ion: 1.23 mmol/L (ref 1.15–1.40)
Chloride: 106 mmol/L (ref 98–111)
Creatinine, Ser: 0.9 mg/dL (ref 0.61–1.24)
Glucose, Bld: 121 mg/dL — ABNORMAL HIGH (ref 70–99)
HCT: 49 % (ref 39.0–52.0)
Hemoglobin: 16.7 g/dL (ref 13.0–17.0)
Potassium: 4.1 mmol/L (ref 3.5–5.1)
Sodium: 141 mmol/L (ref 135–145)
TCO2: 22 mmol/L (ref 22–32)

## 2021-01-10 SURGERY — CYSTOURETEROSCOPY, WITH RETROGRADE PYELOGRAM AND STENT INSERTION
Anesthesia: General | Site: Ureter | Laterality: Right

## 2021-01-10 MED ORDER — FENTANYL CITRATE (PF) 100 MCG/2ML IJ SOLN: 25.0000 ug | INTRAMUSCULAR | Status: DC | PRN

## 2021-01-10 MED ORDER — LACTATED RINGERS IV SOLN: INTRAVENOUS | Status: DC

## 2021-01-10 MED ORDER — LIDOCAINE 2% (20 MG/ML) 5 ML SYRINGE
INTRAMUSCULAR | Status: DC | PRN
  Administered 2021-01-10: 80 mg via INTRAVENOUS

## 2021-01-10 MED ORDER — PROPOFOL 10 MG/ML IV BOLUS
INTRAVENOUS | Status: AC
  Filled 2021-01-10: qty 20

## 2021-01-10 MED ORDER — MIDAZOLAM HCL 2 MG/2ML IJ SOLN
INTRAMUSCULAR | Status: AC
  Filled 2021-01-10: qty 2

## 2021-01-10 MED ORDER — ACETAMINOPHEN 500 MG PO TABS
1000.0000 mg | ORAL_TABLET | Freq: Once | ORAL | Status: AC
  Administered 2021-01-10: 1000 mg via ORAL

## 2021-01-10 MED ORDER — DEXAMETHASONE SODIUM PHOSPHATE 10 MG/ML IJ SOLN
INTRAMUSCULAR | Status: DC | PRN
  Administered 2021-01-10: 10 mg via INTRAVENOUS

## 2021-01-10 MED ORDER — DEXAMETHASONE SODIUM PHOSPHATE 10 MG/ML IJ SOLN
INTRAMUSCULAR | Status: AC
  Filled 2021-01-10: qty 1

## 2021-01-10 MED ORDER — CEPHALEXIN 500 MG PO CAPS: 500.0000 mg | ORAL_CAPSULE | Freq: Two times a day (BID) | ORAL | 0 refills | Status: AC

## 2021-01-10 MED ORDER — FENTANYL CITRATE (PF) 100 MCG/2ML IJ SOLN
INTRAMUSCULAR | Status: AC
  Filled 2021-01-10: qty 2

## 2021-01-10 MED ORDER — MIDAZOLAM HCL 5 MG/5ML IJ SOLN
INTRAMUSCULAR | Status: DC | PRN
  Administered 2021-01-10: 2 mg via INTRAVENOUS

## 2021-01-10 MED ORDER — LIDOCAINE HCL (PF) 2 % IJ SOLN
INTRAMUSCULAR | Status: AC
  Filled 2021-01-10: qty 5

## 2021-01-10 MED ORDER — IOHEXOL 300 MG/ML  SOLN
INTRAMUSCULAR | Status: DC | PRN
  Administered 2021-01-10: 10 mL via URETHRAL

## 2021-01-10 MED ORDER — FENTANYL CITRATE (PF) 250 MCG/5ML IJ SOLN
INTRAMUSCULAR | Status: AC
  Filled 2021-01-10: qty 5

## 2021-01-10 MED ORDER — SODIUM CHLORIDE 0.9 % IR SOLN
Status: DC | PRN
  Administered 2021-01-10: 3000 mL

## 2021-01-10 MED ORDER — FENTANYL CITRATE (PF) 100 MCG/2ML IJ SOLN
INTRAMUSCULAR | Status: DC | PRN
  Administered 2021-01-10 (×2): 25 ug via INTRAVENOUS
  Administered 2021-01-10: 50 ug via INTRAVENOUS

## 2021-01-10 MED ORDER — PROPOFOL 10 MG/ML IV BOLUS
INTRAVENOUS | Status: DC | PRN
  Administered 2021-01-10: 200 mg via INTRAVENOUS

## 2021-01-10 MED ORDER — PHENYLEPHRINE 40 MCG/ML (10ML) SYRINGE FOR IV PUSH (FOR BLOOD PRESSURE SUPPORT)
PREFILLED_SYRINGE | INTRAVENOUS | Status: AC
  Filled 2021-01-10: qty 10

## 2021-01-10 MED ORDER — KETOROLAC TROMETHAMINE 10 MG PO TABS: 10.0000 mg | ORAL_TABLET | Freq: Three times a day (TID) | ORAL | 0 refills | Status: DC | PRN

## 2021-01-10 MED ORDER — PHENYLEPHRINE 40 MCG/ML (10ML) SYRINGE FOR IV PUSH (FOR BLOOD PRESSURE SUPPORT)
PREFILLED_SYRINGE | INTRAVENOUS | Status: DC | PRN
  Administered 2021-01-10 (×2): 80 ug via INTRAVENOUS

## 2021-01-10 MED ORDER — ONDANSETRON HCL 4 MG/2ML IJ SOLN
INTRAMUSCULAR | Status: DC | PRN
  Administered 2021-01-10: 4 mg via INTRAVENOUS

## 2021-01-10 MED ORDER — ACETAMINOPHEN 500 MG PO TABS
ORAL_TABLET | ORAL | Status: AC
  Filled 2021-01-10: qty 1

## 2021-01-10 MED ORDER — ONDANSETRON HCL 4 MG/2ML IJ SOLN
INTRAMUSCULAR | Status: AC
  Filled 2021-01-10: qty 2

## 2021-01-10 SURGICAL SUPPLY — 24 items
BAG DRAIN URO-CYSTO SKYTR STRL (DRAIN) ×3 IMPLANT
BAG DRN UROCATH (DRAIN) ×2
BASKET LASER NITINOL 1.9FR (BASKET) ×3 IMPLANT
BASKET STONE NCOMPASS (UROLOGICAL SUPPLIES) ×3 IMPLANT
BSKT STON RTRVL 120 1.9FR (BASKET) ×2
CATH INTERMIT  6FR 70CM (CATHETERS) ×3 IMPLANT
CLOTH BEACON ORANGE TIMEOUT ST (SAFETY) ×3 IMPLANT
GLOVE SURG ENC MOIS LTX SZ7.5 (GLOVE) ×3 IMPLANT
GOWN STRL REUS W/TWL LRG LVL3 (GOWN DISPOSABLE) ×3 IMPLANT
GUIDEWIRE ANG ZIPWIRE 038X150 (WIRE) ×3 IMPLANT
GUIDEWIRE STR DUAL SENSOR (WIRE) ×3 IMPLANT
IV NS IRRIG 3000ML ARTHROMATIC (IV SOLUTION) ×3 IMPLANT
KIT TURNOVER CYSTO (KITS) ×3 IMPLANT
MANIFOLD NEPTUNE II (INSTRUMENTS) ×3 IMPLANT
NS IRRIG 500ML POUR BTL (IV SOLUTION) ×3 IMPLANT
PACK CYSTO (CUSTOM PROCEDURE TRAY) ×3 IMPLANT
SHEATH URET ACCESS 12FR/35CM (UROLOGICAL SUPPLIES) ×3 IMPLANT
STENT POLARIS 5FRX26 (STENTS) ×3 IMPLANT
SYR 10ML LL (SYRINGE) ×3 IMPLANT
TRACTIP FLEXIVA PULS ID 200XHI (Laser) ×2 IMPLANT
TRACTIP FLEXIVA PULSE ID 200 (Laser) ×3
TUBE CONNECTING 12X1/4 (SUCTIONS) ×3 IMPLANT
TUBE FEEDING 8FR 16IN STR KANG (MISCELLANEOUS) ×3 IMPLANT
TUBING UROLOGY SET (TUBING) ×3 IMPLANT

## 2021-01-10 NOTE — Op Note (Signed)
NAMEMATHHEW, BUYSSE MEDICAL RECORD NO: [id number] ACCOUNT NO: [id number] DATE OF BIRTH: 1960-07-20 FACILITY: [place] LOCATION: [facility] PHYSICIAN: Alexis Frock, MD  Operative Report   PREOPERATIVE DIAGNOSIS:  Residual right renal stone, status post first stage procedure.  POSTOPERATIVE DIAGNOSIS:  Residual right renal stone, status post first stage procedure.  PROCEDURES PERFORMED: 1.  Cystoscopy, right retrograde pyelogram interpretation. 2.  Right ureteroscopy with laser lithotripsy and stent exchange second stage.  ESTIMATED BLOOD LOSS:  Nil.  COMPLICATIONS:  None.  SPECIMEN:  Right renal stone fragments were given to the patient.  FINDINGS: 1.  Residual approximately 1.5 cm total stone volume, mostly mid, lower pole calices. 2.  Complete resolution of all accessible stone fragments larger than one-third mm following laser lithotripsy, basket extraction. 3.  Successful replacement of right ureteral stent, proximal end in the renal pelvis, distal end in the urinary bladder.  INDICATIONS:  The patient is a very pleasant and quite vigorous 60 year old man with longstanding history of recurrent and high risk urolithiasis.  He has been very compliant with surveillance of stones.  He was found on surveillance 4 months ago to have  a large volume recurrence of right renal stone.  Fortunately, no hydronephrosis.  Given the volume of stone, I felt that preemptive ureteroscopy warranted to prevent progression to percutaneous intervention.  He wished to proceed.  He underwent first  stage procedure approximately 2 weeks ago.  At which point, it was estimated approximately 60-70% of the stone volume was ablated and removed.  However, given the very large volume of stone, clearly a second stage procedure would be warranted for  completeness and safety sake and he presents for this today.  Informed consent was obtained and placed on the medical record.  PROCEDURE IN DETAIL:  The patient  being himself and the procedure being right second stage ureteroscopic stone manipulation was confirmed.  Procedure timeout was performed.  Intravenous antibiotics were administered.  General LMA anesthesia was induced.  The patient was placed in the low lithotomy position.   A sterile field was created, prepped and draped the patient's penis, perineum and proximal thigh using iodine.  Cystourethroscopy was performed using 21-French rigid cystoscope with offset lens.  Inspection of the anterior and posterior urethra was  unremarkable.  Inspection of the urinary bladder revealed distal end of right ureteral stent in situ.  It was grasped, brought to the level of the urethral meatus.  A 0.038 ZIPwire was advanced to the lower pole.  This stent was exchanged for an  open-ended catheter and right retrograde pyelogram was obtained.  Right retrograde pyelogram demonstrated a single right ureter with single system right kidney.  There is a dramatic improvement in stone volume now just some residual filling defect in the lower mid and lower poles respectively as anticipated.  A 0.038  ZIPwire was once again advanced and set aside as a safety wire.  An 8-French feeding tube was placed in the urinary bladder for pressure release.  A semirigid ureteroscopy was performed in the distal four-fifth right ureter alongside a separate sensor  working wire.  No significant mucosal abnormalities were found.  Next, a semirigid scope was exchanged for a 12/14 medium length ureteral access sheath at the level of the proximal ureter using continuous fluoroscopic guidance and flexible digital  ureteroscopy was performed using a single channel ureteroscope.  Inspection of the proximal ureter and systematic inspection of the right kidney revealed as anticipated significant residual volume stone, mostly in the mid, lower mid and  lower pole  calices respectively. Estimated total stone volume approximately 1.5 cm.  This was much too  large for simple basketing.  As such, holmium laser energy applied to the stone using settings of 1 joule and 10 Hz.  Using a dusting technique, approximately 70%  of the remaining stone volume was ablated and the remaining area being fragmented.  These fragments were then sequentially grasped with an escape basket, removed and set aside to be given to the patient. Additional basket retrieval was performed using  an NCompass basket, which successfully removed smaller fragments, such that all accessible stone fragments larger than one-third mm had been successfully removed.  I was quite happy with the completeness of the second stage procedure.  No evidence for  any ureteral perforation.  Access sheath was removed under continuous vision.  No significant mucosal abnormalities were found.  Given the large volume of stone, once again addressed and it was felt that interval stenting with nontethered stent would be  most prudent.  As such, a new 5 x 26 Polaris stent was placed over the safety wire using fluoroscopic guidance and good proximal and distal plane were noted.  Procedure was terminated.  The patient tolerated the procedure well.  No immediate  perioperative complications.  The patient was taken to postanesthesia care in stable condition.  Plan for discharge home.  He will have stent removed in the office as planned.   NIK D: 01/10/2021 11:21:24 am T: 01/10/2021 12:29:00 pm  JOB: [id number]/ [id number]

## 2021-01-10 NOTE — Anesthesia Procedure Notes (Signed)
Procedure Name: LMA Insertion Date/Time: 01/10/2021 9:43 AM Performed by: Benancio Osmundson D, CRNA Pre-anesthesia Checklist: Patient identified, Emergency Drugs available, Suction available and Patient being monitored Patient Re-evaluated:Patient Re-evaluated prior to induction Oxygen Delivery Method: Circle system utilized Preoxygenation: Pre-oxygenation with 100% oxygen Induction Type: IV induction Ventilation: Mask ventilation without difficulty LMA: LMA inserted LMA Size: 4.0 Tube type: Oral Number of attempts: 1 Placement Confirmation: positive ETCO2 and breath sounds checked- equal and bilateral Tube secured with: Tape Dental Injury: Teeth and Oropharynx as per pre-operative assessment

## 2021-01-10 NOTE — Discharge Instructions (Addendum)
1 - You may have urinary urgency (bladder spasms) and bloody urine on / off with stent in place. This is normal.  2 - Call MD or go to ER for fever >102, severe pain / nausea / vomiting not relieved by medications, or acute change in medical status  [facility] [phone] Post Ureteroscopy With Stent Instructions  Definitions:  Ureter: The duct that transports urine from the kidney to the bladder. Stent:   A plastic hollow tube that is placed into the ureter, from the kidney to the bladder to prevent the ureter from swelling shut.  GENERAL INSTRUCTIONS:  Despite the fact that no skin incisions were used, the area around the ureter and bladder is raw and irritated. The stent is a foreign body which will further irritate the bladder wall. This irritation is manifested by increased frequency of urination, both day and night, and by an increase in the urge to urinate. In some, the urge to urinate is present almost always. Sometimes the urge is strong enough that you may not be able to stop yourself from urinating. The only real cure is to remove the stent and then give time for the bladder wall to heal which can't be done until the danger of the ureter swelling shut has passed, which varies.  You may see some blood in your urine while the stent is in place and a few days afterwards. Do not be alarmed, even if the urine was clear for a while. Get off your feet and drink lots of fluids until clearing occurs. If you start to pass clots or don't improve, call us.  DIET: You may return to your normal diet immediately. Because of the raw surface of your bladder, alcohol, spicy foods, acid type foods and drinks with caffeine may cause irritation or frequency and should be used in moderation. To keep your urine flowing freely and to avoid constipation, drink plenty of fluids during the day ( 8-10 glasses ). Tip: Avoid cranberry juice because it is very acidic.  ACTIVITY: Your physical  activity doesn't need to be restricted. However, if you are very active, you may see some blood in your urine. We suggest that you reduce your activity under these circumstances until the bleeding has stopped.  BOWELS: It is important to keep your bowels regular during the postoperative period. Straining with bowel movements can cause bleeding. A bowel movement every other day is reasonable. Use a mild laxative if needed, such as Milk of Magnesia 2-3 tablespoons, or 2 Dulcolax tablets. Call if you continue to have problems. If you have been taking narcotics for pain, before, during or after your surgery, you may be constipated. Take a laxative if necessary.   MEDICATION: You should resume your pre-surgery medications unless told not to. In addition you will often be given an antibiotic to prevent infection. These should be taken as prescribed until the bottles are finished unless you are having an unusual reaction to one of the drugs.  PROBLEMS YOU SHOULD REPORT TO [place]: Fevers over 100.5 Fahrenheit. Heavy bleeding, or clots ( See above notes about blood in urine ). Inability to urinate. Drug reactions ( hives, rash, nausea, vomiting, diarrhea ). Severe burning or pain with urination that is not improving.  FOLLOW-UP: You will need a follow-up appointment to monitor your progress. Call for this appointment at the number listed above. Usually the first appointment will be about three to fourteen days after your surgery.   Post Anesthesia Home Care Instructions  Activity: Get plenty of rest for the remainder of the day. A responsible individual must stay with you for 24 hours following the procedure.  For the next 24 hours, DO NOT: -Drive a car -Paediatric nurse -Drink alcoholic beverages -Take any medication unless instructed by your physician -Make any legal decisions or sign important papers.  Meals: Start with liquid foods such as gelatin or soup. Progress to regular foods as  tolerated. Avoid greasy, spicy, heavy foods. If nausea and/or vomiting occur, drink only clear liquids until the nausea and/or vomiting subsides. Call your physician if vomiting continues.  Special Instructions/Symptoms: Your throat may feel dry or sore from the anesthesia or the breathing tube placed in your throat during surgery. If this causes discomfort, gargle with warm salt water. The discomfort should disappear within 24 hours.

## 2021-01-10 NOTE — Anesthesia Postprocedure Evaluation (Signed)
Anesthesia Post Note  Patient: [name] [name]  Procedure(s) Performed: CYSTOSCOPY WITH RETROGRADE PYELOGRAM, URETEROSCOPY AND STENT EXCHANGE (Right: Bladder) HOLMIUM LASER APPLICATION (Right: Ureter)     Patient location during evaluation: PACU Anesthesia Type: General Level of consciousness: awake and alert Pain management: pain level controlled Vital Signs Assessment: post-procedure vital signs reviewed and stable Respiratory status: spontaneous breathing, nonlabored ventilation, respiratory function stable and patient connected to nasal cannula oxygen Cardiovascular status: blood pressure returned to baseline and stable Postop Assessment: no apparent nausea or vomiting Anesthetic complications: no   No notable events documented.  Last Vitals:  Vitals:   01/10/21 1200 01/10/21 1221  BP: (!) 132/95 138/86  Pulse: (!) 57 (!) 58  Resp: 12 12  Temp:    SpO2: 96% 96%    Last Pain:  Vitals:   01/10/21 1221  TempSrc:   PainSc: 0-No pain                 Geraline Halberstadt L Leondro Coryell

## 2021-01-10 NOTE — Anesthesia Preprocedure Evaluation (Addendum)
Anesthesia Evaluation  Patient identified by MRN, date of birth, ID band Patient awake    Reviewed: Allergy & Precautions, NPO status , Patient's Chart, lab work & pertinent test results  Airway Mallampati: II  TM Distance: >3 FB Neck ROM: Full    Dental no notable dental hx. (+) Teeth Intact, Dental Advisory Given   Pulmonary COPD, Current Smoker and Patient abstained from smoking.,    Pulmonary exam normal breath sounds clear to auscultation       Cardiovascular hypertension, Pt. on medications Normal cardiovascular exam Rhythm:Regular Rate:Normal  HLD  TTE 2022 1. Frequent PVCs throughout the study. Left ventricular ejection  fraction, by estimation, is 50 to 55%. The left ventricle has low normal  function. The left ventricle has no regional wall motion abnormalities.  Left ventricular diastolic parameters are  consistent with Grade I diastolic dysfunction (impaired relaxation).  2. Right ventricular systolic function is normal. The right ventricular  size is normal.  3. The mitral valve is normal in structure. Mild mitral valve  regurgitation.  4. The aortic valve is tricuspid. Aortic valve regurgitation is not  visualized. No aortic stenosis is present.  5. The inferior vena cava is normal in size with greater than 50%  respiratory variability, suggesting right atrial pressure of 3 mmHg  Stress Test 2022 Blood pressure demonstrated a hypertensive response to exercise. No T wave inversion was noted during stress. There was no ST segment deviation noted during stress. Very frequent PVCs at rest and with stress. Ventricular triplets noted during stress. Negative, adequate study. Excellent exercise capacity.  Event Monitor 2022 Patient had a minimum heart rate of 56 bpm, maximum heart rate of 130 bpm, and average heart rate of 90 bpm. Predominant underlying rhythm was sinus rhythm. Multiple runs of non-sustained  ventricular tachycardia occurred lasting 7 beats at longest with a max rate of 210 bpm at fastest. Isolated PACs were rare (<1.0%), with rare couplets or triplets present. Isolated PVCs were frequent (33%). No evidence of complete heart block or atrial fibrillation. Triggered and diary events associated with PVCs.      Neuro/Psych negative neurological ROS  negative psych ROS   GI/Hepatic negative GI ROS, Neg liver ROS,   Endo/Other  negative endocrine ROS  Renal/GU negative Renal ROS  negative genitourinary   Musculoskeletal  (+) Arthritis ,   Abdominal   Peds  Hematology negative hematology ROS (+)   Anesthesia Other Findings   Reproductive/Obstetrics                            Anesthesia Physical Anesthesia Plan  ASA: 2  Anesthesia Plan: General   Post-op Pain Management:    Induction: Intravenous  PONV Risk Score and Plan: 3 and Ondansetron, Dexamethasone and Midazolam  Airway Management Planned: LMA  Additional Equipment:   Intra-op Plan:   Post-operative Plan: Extubation in OR  Informed Consent: I have reviewed the patients History and Physical, chart, labs and discussed the procedure including the risks, benefits and alternatives for the proposed anesthesia with the patient or authorized representative who has indicated his/her understanding and acceptance.     Dental advisory given  Plan Discussed with: CRNA  Anesthesia Plan Comments:         Anesthesia Quick Evaluation

## 2021-01-10 NOTE — H&P (Signed)
[name] [name] is an 60 y.o. male.    Chief Complaint: Pre-Op 2nd Stage Ureteroscopic Stone Manipulation  HPI:    -  Nephrolithiasis - s/p staged right ureteroscopic stone manipulation 09/2015 for 2cm renal stone. Composition 100% CaOx. BMP normal. Has had medical passage of several very small stone as well.   Recent Surveillance:  08/2017 - KUB, RUS - punctate Rt papillary tip calcs only.  09/2018 - KUB, RUS - no stones by KUB, passed small stone in last year  10/2019 - KUB - ? Left pap tip only.  11/2020 - KUB, RUS - New significant volume Rt stone 1cm x several withotu hydro by [place] (not visible on KUB)    PMH sig for HLD, Otho surgery, Gout. No strong blood thinners. His PCP is Garret Reddish MD with [facility].    Today " DP " is seen to proceed with second stage RIGHT ureteroscopic stone manipulation for recurrent large volume renal stones. No interval fevers. Most recetn UA without infectious parameters. He underwent 1st stage procedure 6/29.   Past Medical History:  Diagnosis Date  . Arthritis   . ED (erectile dysfunction)   . History of adenomatous polyp of colon    05-27-2015  . History of gout    no issue since 2012  . History of syncope 10/2020   pcp referred pt to cardiology, seen by dr hochrein, note 12-26-2020 in epic;  work-up includes event monitor 12-03-2020 showed 33% PVCs w/ occ nonsustained VT runs,  echo 12-04-2020 ef 55-55% freq PVCs GiDD,  ETT 12-27-2020 showed negative for ischemia  . Hyperlipidemia   . Hypertension   . Raynaud's syndrome   . Renal calculus, right   . Testicular hypogonadism   . Wears glasses     Past Surgical History:  Procedure Laterality Date  . BICEPS TENDON REPAIR Right 2003  . COLONOSCOPY  05/27/2015  . CYSTOSCOPY WITH RETROGRADE PYELOGRAM, URETEROSCOPY AND STENT PLACEMENT Right 01/01/2021   Procedure: CYSTOSCOPY WITH RETROGRADE PYELOGRAM, URETEROSCOPY AND STENT PLACEMENT;  Surgeon: Alexis Frock, MD;  Location: [facility];  Service: Urology;  Laterality: Right;  90 MINS  . CYSTOSCOPY WITH URETEROSCOPY, STONE BASKETRY AND STENT PLACEMENT Right 09/27/2015   Procedure: 2ND STAGE CYSTOSCOPY RIGHT URETEROSCOPY STENT REPLACEMENT;  Surgeon: Alexis Frock, MD;  Location: [facility];  Service: Urology;  Laterality: Right;  . CYSTOSCOPY/URETEROSCOPY/HOLMIUM LASER/STENT PLACEMENT Right 09/13/2015   Procedure: 1ST STAGE - CYSTOSCOPY/URETEROSCOPY/HOLMIUM LASER/STENT PLACEMENT;  Surgeon: Alexis Frock, MD;  Location: [facility];  Service: Urology;  Laterality: Right;  . HOLMIUM LASER APPLICATION Right 67/06/4579   Procedure: HOLMIUM LASER APPLICATION;  Surgeon: Alexis Frock, MD;  Location: [facility];  Service: Urology;  Laterality: Right;  . HOLMIUM LASER APPLICATION Right 99/83/3825   Procedure: HOLMIUM LASER;  Surgeon: Alexis Frock, MD;  Location: [facility];  Service: Urology;  Laterality: Right;  . HOLMIUM LASER APPLICATION Right 0/53/9767   Procedure: 1ST STAGE HOLMIUM LASER APPLICATION;  Surgeon: Alexis Frock, MD;  Location: [facility];  Service: Urology;  Laterality: Right;  . TOTAL SHOULDER ARTHROPLASTY Right 08/17/2013   RIGHT TOTAL SHOULDER ARTHROPLASTY;  Surgeon: [name], MD;  Location: [place];  Service: Orthopedics;  Laterality: Right;    Family History  Problem Relation Age of Onset  . Coronary artery disease Father        CABG 22, former smoker  . Diabetes Father   . Hypertension Father   . Hyperlipidemia  Mother        trig  . Hypertension Sister        HLD  . Colon cancer Neg Hx   . Esophageal cancer Neg Hx   . Rectal cancer Neg Hx   . Stomach cancer Neg Hx   . Prostate cancer Neg Hx    Social History:  reports that he has been smoking cigarettes and cigars. He has a 40.00 pack-year smoking history. He has never used smokeless tobacco. He reports current alcohol use of about 2.0 - 4.0  standard drinks of alcohol per week. He reports that he does not use drugs.  Allergies:  Allergies  Allergen Reactions  . Lisinopril Cough    No medications prior to admission.    No results found for this or any previous visit (from the past 48 hour(s)). No results found.  Review of Systems  Constitutional:  Negative for chills and fever.  Genitourinary:  Positive for urgency.  All other systems reviewed and are negative.  Height 5\' 9"  (1.753 m), weight 78.2 kg. Physical Exam Vitals reviewed.  Constitutional:      Comments: Very pleasant as always, at baseline.   HENT:     Head: Normocephalic.     Nose: Nose normal.     Mouth/Throat:     Mouth: Mucous membranes are moist.  Eyes:     Pupils: Pupils are equal, round, and reactive to light.  Cardiovascular:     Rate and Rhythm: Normal rate.  Pulmonary:     Effort: Pulmonary effort is normal.  Abdominal:     General: Abdomen is flat.  Genitourinary:    Comments: No CVAT at present.  Musculoskeletal:        General: Normal range of motion.     Cervical back: Normal range of motion.  Skin:    General: Skin is warm.  Neurological:     General: No focal deficit present.     Mental Status: He is alert.  Psychiatric:        Mood and Affect: Mood normal.     PLAN:  Proceed as planned with RIGHT 2nd stage ureteroscopic stone manipulation for large volume recurrent renal stones. Risks, benefits, alternatives, expected peri-op course including need for likely post-op temporary stent or even additional stage procedure give large stone volume discussed.   Alexis Frock, MD 01/10/2021, 8:20 AM

## 2021-01-10 NOTE — Brief Op Note (Signed)
01/10/2021  11:16 AM  PATIENT:  [name] [name]  60 y.o. male  PRE-OPERATIVE DIAGNOSIS:  RESIDUAL RIGHT RENAL STONES  POST-OPERATIVE DIAGNOSIS:  RESIDUAL RIGHT RENAL STONES  PROCEDURE:  Procedure(s): CYSTOSCOPY WITH RETROGRADE PYELOGRAM, URETEROSCOPY AND STENT EXCHANGE (Right) HOLMIUM LASER APPLICATION (Right)  SURGEON:  Surgeon(s) and Role:    Alexis Frock, MD - Primary  PHYSICIAN ASSISTANT:   ASSISTANTS: none   ANESTHESIA:   general  EBL:  minimal   BLOOD ADMINISTERED:none  DRAINS: none   LOCAL MEDICATIONS USED:  NONE  SPECIMEN:  Source of Specimen:  Rt renal stone fragments  DISPOSITION OF SPECIMEN:   given to pt  COUNTS:  YES  TOURNIQUET:  * No tourniquets in log *  DICTATION: .Other Dictation: Dictation Number [id number]  PLAN OF CARE: Discharge to home after PACU  PATIENT DISPOSITION:  PACU - hemodynamically stable.   Delay start of Pharmacological VTE agent (>24hrs) due to surgical blood loss or risk of bleeding: not applicable

## 2021-01-10 NOTE — Transfer of Care (Signed)
Immediate Anesthesia Transfer of Care Note  Patient: [name] [name]  Procedure(s) Performed: CYSTOSCOPY WITH RETROGRADE PYELOGRAM, URETEROSCOPY AND STENT EXCHANGE (Right: Bladder) HOLMIUM LASER APPLICATION (Right: Ureter)  Patient Location: PACU  Anesthesia Type:General  Level of Consciousness: awake, alert  and oriented  Airway & Oxygen Therapy: Patient Spontanous Breathing and Patient connected to nasal cannula oxygen  Post-op Assessment: Report given to RN and Post -op Vital signs reviewed and stable  Post vital signs: Reviewed and stable  Last Vitals:  Vitals Value Taken Time  BP    Temp    Pulse 70 01/10/21 1127  Resp 16 01/10/21 1127  SpO2 96 % 01/10/21 1127  Vitals shown include unvalidated device data.  Last Pain:  Vitals:   01/10/21 0847  TempSrc: Oral  PainSc: 2       Patients Stated Pain Goal: 6 (95/28/41 3244)  Complications: No notable events documented.

## 2021-01-13 ENCOUNTER — Encounter (HOSPITAL_BASED_OUTPATIENT_CLINIC_OR_DEPARTMENT_OTHER): Payer: Self-pay | Admitting: Urology

## 2021-01-13 ENCOUNTER — Encounter: Payer: Self-pay | Admitting: Internal Medicine

## 2021-01-27 ENCOUNTER — Other Ambulatory Visit: Payer: Self-pay

## 2021-01-27 ENCOUNTER — Ambulatory Visit (HOSPITAL_COMMUNITY)
Admission: RE | Admit: 2021-01-27 | Discharge: 2021-01-27 | Disposition: A | Discharge status: Home / Self Care | Payer: No Typology Code available for payment source | Source: Ambulatory Visit | Attending: Cardiovascular Disease | Admitting: Cardiovascular Disease

## 2021-01-27 DIAGNOSIS — R0989 Other specified symptoms and signs involving the circulatory and respiratory systems: Secondary | ICD-10-CM | POA: Diagnosis not present

## 2021-02-03 DIAGNOSIS — I447 Left bundle-branch block, unspecified: Secondary | ICD-10-CM

## 2021-02-03 HISTORY — DX: Left bundle-branch block, unspecified: I44.7

## 2021-02-11 ENCOUNTER — Ambulatory Visit: Payer: No Typology Code available for payment source | Admitting: Cardiovascular Disease

## 2021-02-11 ENCOUNTER — Institutional Professional Consult (permissible substitution): Payer: No Typology Code available for payment source | Admitting: Internal Medicine

## 2021-02-12 NOTE — Progress Notes (Signed)
[place]

## 2021-02-13 ENCOUNTER — Encounter: Payer: Self-pay | Admitting: Internal Medicine

## 2021-02-13 ENCOUNTER — Other Ambulatory Visit: Payer: Self-pay

## 2021-02-13 ENCOUNTER — Ambulatory Visit (INDEPENDENT_AMBULATORY_CARE_PROVIDER_SITE_OTHER): Payer: No Typology Code available for payment source | Admitting: Internal Medicine

## 2021-02-13 VITALS — BP 146/92 | HR 73 | Ht 69.0 in | Wt 176.0 lb

## 2021-02-13 DIAGNOSIS — I472 Ventricular tachycardia, unspecified: Secondary | ICD-10-CM

## 2021-02-13 NOTE — Patient Instructions (Signed)
Medication Instructions:  Your physician recommends that you continue on your current medications as directed. Please refer to the Current Medication list given to you today.  *If you need a refill on your cardiac medications before your next appointment, please call your pharmacy*   Lab Work: None ordered  If you have labs (blood work) drawn today and your tests are completely normal, you will receive your results only by: [place] (if you have MyChart) OR A paper copy in the mail If you have any lab test that is abnormal or we need to change your treatment, we will call you to review the results.   Testing/Procedures: Your physician has requested that you have a cardiac MRI. Cardiac MRI uses a computer to create images of your heart as its beating, producing both still and moving pictures of your heart and major blood vessels. For further information please visit [email or web address] Please follow the instruction sheet given to you today for more information.    Follow-Up: At [facility], you and your health needs are our priority.  As part of our continuing mission to provide you with exceptional heart care, we have created designated Provider Care Teams.  These Care Teams include your primary Cardiologist (physician) and Advanced Practice Providers (APPs -  Physician Assistants and Nurse Practitioners) who all work together to provide you with the care you need, when you need it.  We recommend signing up for the patient portal called "MyChart".  Sign up information is provided on this After Visit Summary.  MyChart is used to connect with patients for Virtual Visits (Telemedicine).  Patients are able to view lab/test results, encounter notes, upcoming appointments, etc.  Non-urgent messages can be sent to your provider as well.   To learn more about what you can do with MyChart, go to [email or web address].    Your next appointment:   After the Cardiac MRI  The format for  your next appointment:   In Person  Provider:   [name], MD   Other Instructions You are scheduled (You will be called to schedule) for Cardiac MRI on TBD Please arrive at the [facility] main entrance of [facility] at TBD(30-45 minutes prior to test start time). ?  [facility]  [address]  [place], [place] [address]  [phone]  Proceed to the [facility] Radiology Department (First Floor).  ?    Magnetic resonance imaging (MRI) is a painless test that produces images of the inside of the body without using X-rays. During an MRI, strong magnets and radio waves work together in a [employer], political party to form detailed images. MRI images may provide more details about a medical condition than X-rays, CT scans, and ultrasounds can provide.  You may be given earphones to listen for instructions.  You may eat a light breakfast and take medications as ordered. If a contrast material will be used, an IV will be inserted into one of your veins. Contrast material will be injected into your IV.  You will be asked to remove all metal, including: Watch, jewelry, and other metal objects including hearing aids, hair pieces and dentures. (Braces and fillings normally are not a problem.)  If contrast material was used:  It will leave your body through your urine within a day. You may be told to drink plenty of fluids to help flush the contrast material out of your system.  TEST WILL TAKE APPROXIMATELY 1 HOUR  PLEASE NOTIFY SCHEDULING AT LEAST 24 HOURS  IN ADVANCE IF YOU ARE UNABLE TO KEEP YOUR APPOINTMENT.

## 2021-02-13 NOTE — Progress Notes (Signed)
ELECTROPHYSIOLOGY CONSULT NOTE  Patient ID: [name] [name], MRN: [id number], DOB/AGE: May 05, 1961 60 y.o. Admit date: (Not on file) Date of Consult: 02/13/2021  Primary Physician: Marin Olp, MD Primary Cardiologist: [facility]      [name] [name] is a 60 y.o. male who is being seen today for the evaluation of syncope at the request of [facility].    HPI [name] [name] is a 60 y.o. male referred for an isolated episode of syncope. In April 2022 he had a syncopal episode while walking [place]. He did not have any symptoms before syncopal episode. Lasted a few seconds. He notes when he came aware he was disoriented but was able to stand   No prior history of syncope.  No family history of syncope or sudden death.  Event Recorder personnally reviewed  5/22 PVC freq 33% VT NS  ECG LBBB inferior axis but upright avL so not out of summit  ECG from GXT >> LBBB infer axis transition V4 with QS avR/L suggestive of RVOT VT, but also with exercise bidirectional PVCs     Today, the patient denies chest pain, shortness of breath, nocturnal dyspnea, and no orthopnea  There have been no palpitations.  Complains of lightheadedness occurs when he is squatting and kneeling then go to a standing position     Notice infrequent peripheral edema when he is not physically active   BP log at home approximate systolic ranges [id number] and diastolic ranges approximately 70-80    He works on [employer] [place]. DATE TEST EF%   6/22 Echo   50-55%     Date Cr K Hgb  09/21 1.17 3.9 14.8  07/22 0.90 4.1 16.7    Past Medical History:  Diagnosis Date   Arthritis    ED (erectile dysfunction)    History of adenomatous polyp of colon    05-27-2015   History of gout    no issue since 2012   History of syncope 10/2020   pcp referred pt to cardiology, seen by dr hochrein, note 12-26-2020 in epic;  work-up includes event monitor 12-03-2020 showed 33% PVCs w/ occ  nonsustained VT runs,  echo 12-04-2020 ef 55-55% freq PVCs GiDD,  ETT 12-27-2020 showed negative for ischemia   Hyperlipidemia    Hypertension    Raynaud's syndrome    Renal calculus, right    Testicular hypogonadism    Wears glasses       Surgical History:  Past Surgical History:  Procedure Laterality Date   BICEPS TENDON REPAIR Right 2003   COLONOSCOPY  05/27/2015   CYSTOSCOPY WITH RETROGRADE PYELOGRAM, URETEROSCOPY AND STENT PLACEMENT Right 01/01/2021   Procedure: CYSTOSCOPY WITH RETROGRADE PYELOGRAM, URETEROSCOPY AND STENT PLACEMENT;  Surgeon: Alexis Frock, MD;  Location: [facility];  Service: Urology;  Laterality: Right;  90 MINS   CYSTOSCOPY WITH RETROGRADE PYELOGRAM, URETEROSCOPY AND STENT PLACEMENT Right 01/10/2021   Procedure: CYSTOSCOPY WITH RETROGRADE PYELOGRAM, URETEROSCOPY AND STENT EXCHANGE;  Surgeon: Alexis Frock, MD;  Location: [facility];  Service: Urology;  Laterality: Right;   CYSTOSCOPY WITH URETEROSCOPY, STONE BASKETRY AND STENT PLACEMENT Right 09/27/2015   Procedure: 2ND STAGE CYSTOSCOPY RIGHT URETEROSCOPY STENT REPLACEMENT;  Surgeon: Alexis Frock, MD;  Location: [facility];  Service: Urology;  Laterality: Right;   CYSTOSCOPY/URETEROSCOPY/HOLMIUM LASER/STENT PLACEMENT Right 09/13/2015   Procedure: 1ST STAGE - CYSTOSCOPY/URETEROSCOPY/HOLMIUM LASER/STENT PLACEMENT;  Surgeon: Alexis Frock, MD;  Location: [facility];  Service: Urology;  Laterality: Right;   HOLMIUM LASER APPLICATION Right [id number]   Procedure: HOLMIUM LASER APPLICATION;  Surgeon: Alexis Frock, MD;  Location: [facility];  Service: Urology;  Laterality: Right;   HOLMIUM LASER APPLICATION Right [id number]   Procedure: HOLMIUM LASER;  Surgeon: Alexis Frock, MD;  Location: [facility];  Service: Urology;  Laterality: Right;   HOLMIUM LASER APPLICATION Right [id number]   Procedure: 1ST STAGE HOLMIUM LASER  APPLICATION;  Surgeon: Alexis Frock, MD;  Location: [facility];  Service: Urology;  Laterality: Right;   HOLMIUM LASER APPLICATION Right [id number]   Procedure: HOLMIUM LASER APPLICATION;  Surgeon: Alexis Frock, MD;  Location: [facility];  Service: Urology;  Laterality: Right;   TOTAL SHOULDER ARTHROPLASTY Right 08/17/2013   RIGHT TOTAL SHOULDER ARTHROPLASTY;  Surgeon: [name], MD;  Location: [place];  Service: Orthopedics;  Laterality: Right;     Home Meds: Current Meds  Medication Sig   amLODipine (NORVASC) 5 MG tablet Take 1 tablet (5 mg total) by mouth daily.   amoxicillin (AMOXIL) 500 MG capsule Take 2,000 mg by mouth as directed.   atorvastatin (LIPITOR) 20 MG tablet Take 1 tablet (20 mg total) by mouth daily.   fluticasone (FLONASE) 50 MCG/ACT nasal spray Place 1 spray into both nostrils daily as needed.    sildenafil (REVATIO) 20 MG tablet Take 20 mg by mouth as needed (ed).    Allergies:  Allergies  Allergen Reactions   Lisinopril Cough    Social History   Socioeconomic History   Marital status: Married    Spouse name: Not on file   Number of children: Not on file   Years of education: Not on file   Highest education level: Not on file  Occupational History   Not on file  Tobacco Use   Smoking status: Former    Packs/day: 1.00    Years: 40.00    Pack years: 40.00    Types: Cigarettes, Cigars    Quit date: 12/03/2020    Years since quitting: 0.1   Smokeless tobacco: Never   Tobacco comments:    quit cigarett's 2014  was 5 cig. per day/   currently occasional cigar  Substance and Sexual Activity   Alcohol use: Yes    Alcohol/week: 2.0 - 4.0 standard drinks    Types: 2 - 4 Shots of liquor per week   Drug use: No   Sexual activity: Not on file  Other Topics Concern   Not on file  Social History Narrative   Married. Son and daughter (65 and 68 in 2021).  Grandchild in [place] with daughter summer 2021.       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-[employer]       Hobbies: fishing on [employer], golf, gym, family and friends time   Social Determinants of Radio broadcast assistant Strain: Not on file  Food Insecurity: Not on file  Transportation Needs: Not on file  Physical Activity: Not on file  Stress: Not on file  Social Connections: Not on file  Intimate Partner Violence: Not on file     Family History  Problem Relation Age of Onset   Coronary artery disease Father        CABG 8, former smoker   Diabetes Father    Hypertension Father    Hyperlipidemia Mother        trig  Hypertension Sister        HLD   Colon cancer Neg Hx    Esophageal cancer Neg Hx    Rectal cancer Neg Hx    Stomach cancer Neg Hx    Prostate cancer Neg Hx      ROS:  Please see the history of present illness.     All other systems reviewed and negative.    Physical Exam: BP (!) 146/92 (BP Location: Left Arm, Patient Position: Sitting, Cuff Size: Normal)   Pulse 73   Ht '5\' 9"'$  (1.753 m)   Wt 176 lb (79.8 kg)   SpO2 98%   BMI 25.99 kg/m  Alert and oriented in no acute distress HENT- normal Eyes- EOMI, without scleral icterus Skin- warm and dry; without rashes LN-neg Neck- supple without thyromegaly, JVP-flat, carotids brisk and full without bruits Back-without CVAT or kyphosis Lungs-clear to auscultation CV-Irregular rate and rhythm, nl S1 and S2, no murmurs gallops or rubs, S4-absent Abd-soft with active bowel sounds; no midline pulsation or hepatomegaly Pulses-intact femoral and distal MKS-without gross deformity Neuro- Ax O, CN3-12 intact, grossly normal motor and sensory function Affect engaging   ECG: Sinus at 73 Interval 16/10/41 PVCs with a indeterminate left bundle morphology with aVL being monophasic R wave-fractionated and aVR being QS    Labs: Cardiac Enzymes CBC Lab Results  Component  Value Date   WBC 11.6 (H) 10/21/2020   HGB 16.7 01/10/2021   HCT 49.0 01/10/2021   MCV 90.4 10/21/2020   PLT 204.0 10/21/2020   PROTIME: No results for input(s): LABPROT, INR in the last 72 hours. Chemistry No results for input(s): NA, K, CL, CO2, BUN, CREATININE, CALCIUM, PROT, BILITOT, ALKPHOS, ALT, AST, GLUCOSE in the last 168 hours.  Invalid input(s): LABALBU Lipids Lab Results  Component Value Date   CHOL 147 03/08/2020   HDL 50 03/08/2020   LDLCALC 61 03/08/2020   TRIG 343 (H) 03/08/2020   BNP No results found for: PROBNP Thyroid Function Tests: No results for input(s): TSH, T4TOTAL, T3FREE, THYROIDAB in the last 72 hours.  Invalid input(s): FREET3 Miscellaneous No results found for: DDIMER      Assessment and Plan:  Syncope abrupt onset and offset  PVCs-dimorphic morphology right bundle inferior axis with upright QRS lead aVL versus negative QRS in lead aVL  Exercise-induced biventricular, alternating axis PVCs  Cardiomyopathy-mild  Hypertension   The patient has abrupt onset offset syncope concerning for arrhythmic trigger.  His monitoring data raises a number of concerns of potential causes.  The first, suggested by the treadmill testing with alternating morphologies of an exercise-induced ventricular ectopy is Catecholaminergic polymorphic ventricular tachycardia.  The other suggested by the burden of ventricular ectopy some of which clearly is coming from the right ventricular outflow tract and the summit and others of which is clearly not suggests ARVC.  cMRI may be able to help [place] make this distinction.  We will start there.  The ECG does not have anterior T wave inversions that would suggest ARVC  I outlined for him that both ARVC and CPVT are potentially life-threatening arrhythmic disorders, the therapies of which are dependent upon the diagnosis as are genetic implications.  Beta-blockers are probably more appropriate than the calcium  blocker-amlodipine.  Hence we will discontinue and start him on nadolol as in the group of CPVT patient's nonselective beta-blockers are much more effective than are the selective beta-blockers. We will start at 20 mg a day and then increase to 40 mg as tolerated  His cardiomyopathy has a variety of potential contributors.  The first is PVCs, the second is an underlying process associated with the PVCs.  This prompts the importance of PVC suppression as part of his therapy.  As noted above initially we will start with nadolol.  He may need further antiarrhythmic therapies either drugs and or ablation.  He is advised this previously not to drive for up to 6 months  I,Stephanie Williams,acting as a scribe for [name], MD.,have documented all relevant documentation on the behalf of [name], MD,as directed by  [name], MD while in the presence of [name], MD.  I, [name], MD, have reviewed all documentation for this visit. The documentation on 02/13/21 for the exam, diagnosis, procedures, and orders are all accurate and complete.

## 2021-02-24 ENCOUNTER — Other Ambulatory Visit: Payer: Self-pay | Admitting: Family Medicine

## 2021-03-10 ENCOUNTER — Other Ambulatory Visit: Payer: Self-pay | Admitting: Family Medicine

## 2021-03-26 ENCOUNTER — Telehealth: Payer: Self-pay

## 2021-03-26 NOTE — Telephone Encounter (Signed)
I spoke with the pt to address concerns below. I confirmed that lab work would be done the day os his CPE, but if there is anything specific that needs to be drawn he would need to let us know before then. He agrees, and will update [place] with that information.

## 2021-03-26 NOTE — Progress Notes (Addendum)
Phone: [phone]   Subjective:  Patient presents today for their annual physical. Chief complaint-noted.   See problem oriented charting- Review of Systems  Constitutional:  Negative for chills and fever.  HENT:  Positive for congestion (ragweed related). Negative for ear pain.   Eyes:  Negative for blurred vision and double vision.  Respiratory:  Negative for cough and shortness of breath.   Cardiovascular:  Negative for chest pain and palpitations.  Gastrointestinal:  Negative for abdominal pain, constipation, diarrhea, nausea and vomiting.  Genitourinary:  Negative for dysuria and frequency.  Musculoskeletal:  Negative for back pain and myalgias.  Skin:  Negative for itching and rash.  Neurological:  Negative for dizziness and headaches.  Endo/Heme/Allergies:  Negative for polydipsia. Does not bruise/bleed easily.  Psychiatric/Behavioral:  Negative for depression and suicidal ideas.   The following were reviewed and entered/updated in epic: Past Medical History:  Diagnosis Date   Arthritis    ED (erectile dysfunction)    History of adenomatous polyp of colon    05-27-2015   History of gout    no issue since 2012   History of syncope 10/2020   pcp referred pt to cardiology, seen by dr hochrein, note 12-26-2020 in epic;  work-up includes event monitor 12-03-2020 showed 33% PVCs w/ occ nonsustained VT runs,  echo 12-04-2020 ef 55-55% freq PVCs GiDD,  ETT 12-27-2020 showed negative for ischemia   Hyperlipidemia    Hypertension    Raynaud's syndrome    Renal calculus, right    Testicular hypogonadism    Wears glasses    Patient Active Problem List   Diagnosis Date Noted   Emphysema of lung ([place]) 03/08/2020    Priority: High   Tobacco abuse 08/13/2014    Priority: High   Hyperlipidemia 05/26/2010    Priority: Medium   Gout 05/26/2010    Priority: Medium   Essential hypertension 05/26/2010    Priority: Medium   History of adenomatous polyp of colon 06/03/2015     Priority: Low   Allergic rhinitis 08/13/2014    Priority: Low   S/P shoulder replacement 08/17/2013    Priority: Low   Acne rosacea 05/05/2013    Priority: Low   Past Surgical History:  Procedure Laterality Date   BICEPS TENDON REPAIR Right 2003   COLONOSCOPY  05/27/2015   CYSTOSCOPY WITH RETROGRADE PYELOGRAM, URETEROSCOPY AND STENT PLACEMENT Right 01/01/2021   Procedure: CYSTOSCOPY WITH RETROGRADE PYELOGRAM, URETEROSCOPY AND STENT PLACEMENT;  Surgeon: Alexis Frock, MD;  Location: [facility];  Service: Urology;  Laterality: Right;  90 MINS   CYSTOSCOPY WITH RETROGRADE PYELOGRAM, URETEROSCOPY AND STENT PLACEMENT Right 01/10/2021   Procedure: CYSTOSCOPY WITH RETROGRADE PYELOGRAM, URETEROSCOPY AND STENT EXCHANGE;  Surgeon: Alexis Frock, MD;  Location: [facility];  Service: Urology;  Laterality: Right;   CYSTOSCOPY WITH URETEROSCOPY, STONE BASKETRY AND STENT PLACEMENT Right 09/27/2015   Procedure: 2ND STAGE CYSTOSCOPY RIGHT URETEROSCOPY STENT REPLACEMENT;  Surgeon: Alexis Frock, MD;  Location: [facility];  Service: Urology;  Laterality: Right;   CYSTOSCOPY/URETEROSCOPY/HOLMIUM LASER/STENT PLACEMENT Right 09/13/2015   Procedure: 1ST STAGE - CYSTOSCOPY/URETEROSCOPY/HOLMIUM LASER/STENT PLACEMENT;  Surgeon: Alexis Frock, MD;  Location: [facility];  Service: Urology;  Laterality: Right;   HOLMIUM LASER APPLICATION Right 78/24/2353   Procedure: HOLMIUM LASER APPLICATION;  Surgeon: Alexis Frock, MD;  Location: [facility];  Service: Urology;  Laterality: Right;   HOLMIUM LASER APPLICATION Right 61/44/3154   Procedure: HOLMIUM LASER;  Surgeon: Alexis Frock, MD;  Location: [place];  Service: Urology;  Laterality: Right;   HOLMIUM LASER APPLICATION Right 5/85/2778   Procedure: 1ST STAGE HOLMIUM LASER APPLICATION;  Surgeon: Alexis Frock, MD;  Location: [facility];  Service:  Urology;  Laterality: Right;   HOLMIUM LASER APPLICATION Right 08/10/2351   Procedure: HOLMIUM LASER APPLICATION;  Surgeon: Alexis Frock, MD;  Location: [facility];  Service: Urology;  Laterality: Right;   TOTAL SHOULDER ARTHROPLASTY Right 08/17/2013   RIGHT TOTAL SHOULDER ARTHROPLASTY;  Surgeon: [name], MD;  Location: [facility];  Service: Orthopedics;  Laterality: Right;    Family History  Problem Relation Age of Onset   Coronary artery disease Father        CABG 31, former smoker   Diabetes Father    Hypertension Father    Hyperlipidemia Mother        trig   Hypertension Sister        HLD   Colon cancer Neg Hx    Esophageal cancer Neg Hx    Rectal cancer Neg Hx    Stomach cancer Neg Hx    Prostate cancer Neg Hx     Medications- reviewed and updated Current Outpatient Medications  Medication Sig Dispense Refill   amLODipine (NORVASC) 5 MG tablet TAKE 1 TABLET BY MOUTH EVERY DAY 90 tablet 3   atorvastatin (LIPITOR) 20 MG tablet TAKE 1 TABLET BY MOUTH EVERY DAY 90 tablet 3   fluticasone (FLONASE) 50 MCG/ACT nasal spray Place 1 spray into both nostrils daily as needed.      sildenafil (REVATIO) 20 MG tablet Take 20 mg by mouth as needed (ed).     amoxicillin (AMOXIL) 500 MG capsule Take 2,000 mg by mouth as directed. (Patient not taking: Reported on 03/31/2021)     ketorolac (TORADOL) 10 MG tablet Take 1 tablet (10 mg total) by mouth every 8 (eight) hours as needed for moderate pain (or stent discomfort post-operatively). (Patient not taking: No sig reported) 20 tablet 0   oxyCODONE-acetaminophen (PERCOCET) 5-325 MG tablet Take 1 tablet by mouth every 6 (six) hours as needed for severe pain. Post-operatively (Patient not taking: No sig reported) 15 tablet 0   senna-docusate (SENOKOT-S) 8.6-50 MG tablet Take 1 tablet by mouth 2 (two) times daily. While taking strongest pain meds to prevent constipation (Patient not taking: No sig reported) 10 tablet 0   No current  facility-administered medications for this visit.    Allergies-reviewed and updated Allergies  Allergen Reactions   Lisinopril Cough    Social History   Social History Narrative   Married. Son and daughter (81 and 77 in 2021).  Grandchild in [place] with daughter summer 2021.       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  Pulse 65   Temp 98.1 F (36.7 C) (Temporal)   Ht 5\' 9"  (1.753 m)   Wt 179 lb 3.2 oz (81.3 kg)   SpO2 98%   BMI 26.46 kg/m  Gen: NAD, resting comfortably HEENT: Mucous membranes are moist. Oropharynx normal Neck: no thyromegaly CV: RRR no murmurs rubs or gallops Lungs: CTAB no crackles, wheeze, rhonchi Abdomen: soft/nontender/nondistended/normal bowel sounds. No rebound or guarding.  Ext:  no edema Skin: warm, dry Neuro: grossly normal, moves all extremities, PERRLA   Assessment and Plan  60 y.o. male presenting for annual physical.  Health Maintenance counseling: 1. Anticipatory guidance: Patient counseled regarding regular dental exams -q6 months, eye exams -  every two years - just had one,  avoiding smoking and second hand smoke-see discussion below, limiting alcohol to 2 beverages per day.  No illicit drugs 2. Risk factor reduction:  Advised patient of need for regular exercise and diet rich and fruits and vegetables to reduce risk of heart attack and stroke. Exercise-  limiting until he sees result of cardiac workup. 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 Counseling provided Wt Readings from Last 3 Encounters:  03/31/21 179 lb 3.2 oz (81.3 kg)  02/13/21 176 lb (79.8 kg)  01/10/21 173 lb 12.8  oz (78.8 kg)  3. Immunizations/screenings/ancillary studies DISCUSSED:  -COVID vaccination #4 repeat planned - just got over  covid-19 3 weeks ago and discussed needs to wait at least 3 months -Flu vaccination (last one 09/21) - regular dose flu shot today Immunization History  Administered Date(s) Administered   Influenza Whole 05/26/2010   Influenza, Quadrivalent, Recombinant, Inj, Pf 04/26/2019   Influenza,inj,Quad PF,6+ Mos 04/07/2016, 03/08/2020, 03/31/2021   Moderna Sars-Covid-2 Vaccination 10/23/2019, 11/20/2019   PFIZER(Purple Top)SARS-COV-2 Vaccination 06/12/2020   Pneumococcal Polysaccharide-23 08/18/2013   Td 05/26/2010, 04/07/2016   Zoster Recombinat (Shingrix) 04/26/2019, 08/29/2019   4. Prostate cancer screening- does yearly with urology.  Since done recently we did not repeat today Lab Results  Component Value Date   PSA 0.14 11/19/2020   PSA 0.13 10/12/2019   PSA 0.18 08/10/2017   5. Colon cancer screening -  due 05/27/2023 for 8-year repeat per Dr. Ardis Hughs letter 6. Skin cancer screening-October 13th scheduled. advised regular sunscreen use. Denies worrisome, changing, or new skin lesions.  7. Smoking associated screening (lung cancer screening, AAA screen 65-75, UA)- quit at end of April!!!!!. Enrolled in lung cancer screening program. They did note emphysema (stable and asymptomatic) and coronary arthrosclerosis. Will get UA with urology later today Dr. Tresa Moore 8. STD screening -monogamous  Status of chronic or acute concerns   # Right cystoscopy w/ retrograde pyelogram, ureteroscopy and stent exchange surgery 01/10/2021- done with Dr. Tresa Moore. Gets stone analysis today  # also had- Percutaneous Nephrolithotomy 01/01/2021   # Syncope S:Office visit with Dr.Burchette on 10/21/2020 for loss of consciousness. Pt "blacked out" on one Friday morning and fell face first-was down for a few seconds and was able to get up on his own. -He was blowing off greens and had already  been working for about 30 minutes when he has brief recollection of being lightheaded and then apparently fell face forward. There was a coworker with him who observed and states that he was out for literally only couple of seconds and then he got up on his own power.  He did not recall any dizziness afterwards. Denied nausea or vomiting, palpitations or chest pains.  No history of seizures.  No history of vasovagal syncope.  He does not recall any dizziness that morning when he first got up and states that he "felt fine ".  Patient was ultimately referred to cardiology and then to Dr. Caryl Comes ECHO 06/22:  IMPRESSIONS   1. Frequent PVCs throughout the study. Left ventricular ejection  fraction, by estimation, is 50 to 55%. The left ventricle has low normal  function.  The left ventricle has no regional wall motion abnormalities.  Left ventricular diastolic parameters are  consistent with Grade I diastolic dysfunction (impaired relaxation).   2. Right ventricular systolic function is normal. The right ventricular  size is normal.   3. The mitral valve is normal in structure. Mild mitral valve  regurgitation.   4. The aortic valve is tricuspid. Aortic valve regurgitation is not  visualized. No aortic stenosis is present.   5. The inferior vena cava is normal in size with greater than 50%  respiratory variability, suggesting right atrial pressure of 3 mmHg.  Office visit with Dr.Hochrein on 12/26/2020:  ETT 06/22:  Blood pressure demonstrated a hypertensive response to exercise. No T wave inversion was noted during stress. There was no ST segment deviation noted during stress. Very frequent PVCs at rest and with stress. Ventricular triplets noted during stress. Negative, adequate study. Excellent exercise capacity. 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  Patient has  been recommended not to drive for 6 months after incident- thankfully no further incidents A/P: hopefully cardiac workup coming to a close soon with cardiac MRI. Thrilled no more episodes. Will look for results of cardiac MRI.    #hypertension S: medication: amlodipine 5 mg daily (consideration has been beta blocker) Home readings #s: some variability BP Readings from Last 3 Encounters:  03/31/21 (!) 144/88  02/13/21 (!) 146/92  01/10/21 138/86  A/P: We discussed typically I would increase amlodipine to 10 mg due to mild poor control-with that being said has upcoming cardiac work-up and may be placed on a beta-blocker instead of amlodipine so prefer not to increase amlodipine at this time if it is going to be later changed to a beta-blocker. Would look to cardiology for guidance  #hyperlipidemia/coronary artery calcifications on lung cancer screening  S: Medication:atovastatin 20mg  daily   Lab Results  Component Value Date   CHOL 147 03/08/2020   HDL 50 03/08/2020   LDLCALC 61 03/08/2020   LDLDIRECT 85.0 11/30/2017   TRIG 343 (H) 03/08/2020   CHOLHDL 2.9 03/08/2020   A/P: Excellent control last year with LDL under 70-update full lipid panel today  #ED- sildenafil was helpful when needed. From urology- no issues with syncope or chest pain    #sciatica- inversion tablet had helped him.    Recommended follow up: Return in about 1 year (around 03/31/2022) for follow-up or sooner if needed. Future Appointments  Date Time Provider [place]  04/02/2021  9:00 AM [facility] 1 [facility]  04/07/2021 10:00 AM Minus Breeding, MD [facility]   Lab/Order associations: fasting   ICD-10-CM   1. Preventative health care  Z00.00 CBC with Differential/Platelet     Comprehensive metabolic panel    Lipid panel    CANCELED: POCT Urinalysis Dipstick (Automated)    2. Essential hypertension  I10 CBC with Differential/Platelet    Comprehensive metabolic panel    Lipid panel    CANCELED: POCT Urinalysis Dipstick (Automated)    3. Hyperlipidemia, unspecified hyperlipidemia type  E78.5 CBC with Differential/Platelet    Comprehensive metabolic panel    Lipid panel    CANCELED: POCT Urinalysis Dipstick (Automated)    4. Screening for colon cancer  Z12.11 Ambulatory referral to Gastroenterology    5. Pulmonary emphysema, unspecified emphysema type (HCC) Chronic J43.9     6. Need for immunization against influenza  Z23 Flu Vaccine QUAD 84mo+IM (Fluarix, Fluzone & Alfiuria Quad PF)     I,Jada Bradford,acting as a scribe for Garret Reddish, MD.,have documented all relevant documentation on the behalf of Garret Reddish, MD,as directed by  Garret Reddish, MD while in the presence of Garret Reddish, MD.  I, Garret Reddish, MD, have reviewed all documentation for this visit. The documentation on 03/31/21 for the exam, diagnosis, procedures, and orders are all accurate and complete.   Return precautions advised.  Garret Reddish, MD

## 2021-03-26 NOTE — Telephone Encounter (Signed)
Pt called with a few questions about labs. He stated that he is coming for a physical on 9/26. He also stated that he having an MRI on 9/28. Landon stated that he needs tabs for the MRI and wants to know if he can get them done with his physical on 9/26. He stated that he doesn't know what labs are being needed but he stated that Dr Yong Channel should be able to tell him. Please Advise.

## 2021-03-28 ENCOUNTER — Telehealth: Payer: Self-pay | Admitting: Internal Medicine

## 2021-03-28 NOTE — Telephone Encounter (Signed)
Please call to discuss cardiac MRI labwork that is needed. He is seeing his PCP and needs to let them know what labs are needed for that procedure.

## 2021-03-28 NOTE — Telephone Encounter (Signed)
Was able to reach back out to [name] [name], advised on labs for MRI. H&H or could have CBC that will cover his hemoglobin and hematocrit. Pt is having PCP draw labs.  PCP, Dr. Yong Channel is with the Epic system, can see results in his EMR.   [name] [name] thankful for returning his call.

## 2021-03-31 ENCOUNTER — Other Ambulatory Visit: Payer: Self-pay

## 2021-03-31 ENCOUNTER — Encounter: Payer: Self-pay | Admitting: Family Medicine

## 2021-03-31 ENCOUNTER — Ambulatory Visit (INDEPENDENT_AMBULATORY_CARE_PROVIDER_SITE_OTHER): Payer: No Typology Code available for payment source | Admitting: Family Medicine

## 2021-03-31 ENCOUNTER — Ambulatory Visit (HOSPITAL_COMMUNITY): Payer: No Typology Code available for payment source

## 2021-03-31 ENCOUNTER — Telehealth (HOSPITAL_COMMUNITY): Payer: Self-pay | Admitting: Emergency Medicine

## 2021-03-31 VITALS — BP 144/88 | HR 65 | Temp 98.1°F | Ht 69.0 in | Wt 179.2 lb

## 2021-03-31 DIAGNOSIS — Z0000 Encounter for general adult medical examination without abnormal findings: Secondary | ICD-10-CM

## 2021-03-31 DIAGNOSIS — Z23 Encounter for immunization: Secondary | ICD-10-CM | POA: Diagnosis not present

## 2021-03-31 DIAGNOSIS — I10 Essential (primary) hypertension: Secondary | ICD-10-CM

## 2021-03-31 DIAGNOSIS — E785 Hyperlipidemia, unspecified: Secondary | ICD-10-CM | POA: Diagnosis not present

## 2021-03-31 DIAGNOSIS — Z1211 Encounter for screening for malignant neoplasm of colon: Secondary | ICD-10-CM

## 2021-03-31 DIAGNOSIS — J439 Emphysema, unspecified: Secondary | ICD-10-CM

## 2021-03-31 LAB — LIPID PANEL
Cholesterol: 174 mg/dL (ref 0–200)
HDL: 61.7 mg/dL (ref >39.00)
LDL Cholesterol: 73 mg/dL (ref 0–99)
NonHDL: 112.34
Total CHOL/HDL Ratio: 3
Triglycerides: 197 mg/dL — ABNORMAL HIGH (ref 0.0–149.0)
VLDL: 39.4 mg/dL (ref 0.0–40.0)

## 2021-03-31 LAB — COMPREHENSIVE METABOLIC PANEL
ALT: 26 U/L (ref 0–53)
AST: 24 U/L (ref 0–37)
Albumin: 4.3 g/dL (ref 3.5–5.2)
Alkaline Phosphatase: 82 U/L (ref 39–117)
BUN: 16 mg/dL (ref 6–23)
CO2: 25 mEq/L (ref 19–32)
Calcium: 9.4 mg/dL (ref 8.4–10.5)
Chloride: 106 mEq/L (ref 96–112)
Creatinine, Ser: 1.09 mg/dL (ref 0.40–1.50)
GFR: 74.73 mL/min (ref >60.00)
Glucose, Bld: 106 mg/dL — ABNORMAL HIGH (ref 70–99)
Potassium: 4.6 mEq/L (ref 3.5–5.1)
Sodium: 139 mEq/L (ref 135–145)
Total Bilirubin: 0.7 mg/dL (ref 0.2–1.2)
Total Protein: 6.7 g/dL (ref 6.0–8.3)

## 2021-03-31 LAB — CBC WITH DIFFERENTIAL/PLATELET
Basophils Absolute: 0.1 10*3/uL (ref 0.0–0.1)
Basophils Relative: 0.9 % (ref 0.0–3.0)
Eosinophils Absolute: 0.3 10*3/uL (ref 0.0–0.7)
Eosinophils Relative: 4 % (ref 0.0–5.0)
HCT: 47.5 % (ref 39.0–52.0)
Hemoglobin: 15.5 g/dL (ref 13.0–17.0)
Lymphocytes Relative: 21.1 % (ref 12.0–46.0)
Lymphs Abs: 1.7 10*3/uL (ref 0.7–4.0)
MCHC: 32.6 g/dL (ref 30.0–36.0)
MCV: 90.3 fl (ref 78.0–100.0)
Monocytes Absolute: 0.5 10*3/uL (ref 0.1–1.0)
Monocytes Relative: 6.8 % (ref 3.0–12.0)
Neutro Abs: 5.3 10*3/uL (ref 1.4–7.7)
Neutrophils Relative %: 67.2 % (ref 43.0–77.0)
Platelets: 208 10*3/uL (ref 150.0–400.0)
RBC: 5.26 Mil/uL (ref 4.22–5.81)
RDW: 13.8 % (ref 11.5–15.5)
WBC: 7.9 10*3/uL (ref 4.0–10.5)

## 2021-03-31 NOTE — Telephone Encounter (Signed)
Reaching out to patient to offer assistance regarding upcoming cardiac imaging study; pt verbalizes understanding of appt date/time, parking situation and where to check in, pre-test NPO status and medications ordered, and verified current allergies; name and call back number provided for further questions should they arise Marchia Bond RN Navigator Cardiac Imaging [facility] [phone] office [phone] cell  Denies claustro Denies implants

## 2021-03-31 NOTE — Patient Instructions (Addendum)
Health Maintenance Due  Topic Date Due   INFLUENZA VACCINE  - regular dose done today.  02/03/2021   Please consider new Omicron-Specific shot in 3 months since you just had COVID.   Please stop by lab before you go If you have mychart- we will send your results within 3 business days of [place] receiving them.  If you do not have mychart- we will call you about results within 5 business days of [place] receiving them.  *please also note that you will see labs on mychart as soon as they post. I will later go in and write notes on them- will say "notes from Dr. Yong Channel"  Recommended follow up: Return in about 1 year (around 03/31/2022) for follow-up or sooner if needed.

## 2021-03-31 NOTE — Addendum Note (Signed)
Addended by: Marin Olp on: 03/31/2021 09:06 AM   Modules accepted: Orders

## 2021-04-02 ENCOUNTER — Ambulatory Visit (HOSPITAL_COMMUNITY)
Admission: RE | Admit: 2021-04-02 | Discharge: 2021-04-02 | Disposition: A | Discharge status: Home / Self Care | Payer: No Typology Code available for payment source | Source: Ambulatory Visit | Attending: Internal Medicine | Admitting: Internal Medicine

## 2021-04-02 ENCOUNTER — Other Ambulatory Visit: Payer: Self-pay

## 2021-04-02 DIAGNOSIS — I472 Ventricular tachycardia, unspecified: Secondary | ICD-10-CM

## 2021-04-02 MED ORDER — GADOBUTROL 1 MMOL/ML IV SOLN
10.0000 mL | Freq: Once | INTRAVENOUS | Status: AC | PRN
  Administered 2021-04-02: 10 mL via INTRAVENOUS

## 2021-04-06 DIAGNOSIS — I472 Ventricular tachycardia, unspecified: Secondary | ICD-10-CM | POA: Insufficient documentation

## 2021-04-06 DIAGNOSIS — R55 Syncope and collapse: Secondary | ICD-10-CM | POA: Insufficient documentation

## 2021-04-06 NOTE — Progress Notes (Signed)
Cardiology Office Note   Date:  04/08/2021   ID:  [name] [name], DOB 07-10-1960, MRN [id number]  PCP:  Marin Olp, MD  Cardiologist:   Minus Breeding, MD Referring:  Marin Olp, MD  Chief Complaint  Patient presents with   Loss of Consciousness        History of Present Illness: [name] [name] is a 60 y.o. male who is referred by Marin Olp, MD for evaluation of syncope.   He has frequent ventricular ectopy and a markedly abnormal EKG.  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 He was treated with Nadolol.  He has an echo with a low normal EF on echo.  MRI demonstrated a normal EF.  There was no diagnostic criteria for ARVC.  The was mild LV dysfunction without RV dysfunction.       Since I last saw him he has done well.  He has had no further syncope.  [place] working but he has not been exercising until he gets cleared by [place].  Past Medical History:  Diagnosis Date   Arthritis    ED (erectile dysfunction)    History of adenomatous polyp of colon    05-27-2015   History of gout    no issue since 2012   History of syncope 10/2020   pcp referred pt to cardiology, seen by dr Yen Wandell, note 12-26-2020 in epic;  work-up includes event monitor 12-03-2020 showed 33% PVCs w/ occ nonsustained VT runs,  echo 12-04-2020 ef 55-55% freq PVCs GiDD,  ETT 12-27-2020 showed negative for ischemia   Hyperlipidemia    Hypertension    Raynaud's syndrome    Renal calculus, right    Testicular hypogonadism    Wears glasses     Past Surgical History:  Procedure Laterality Date   BICEPS TENDON REPAIR Right 2003   COLONOSCOPY  05/27/2015   CYSTOSCOPY WITH RETROGRADE PYELOGRAM, URETEROSCOPY AND STENT PLACEMENT Right 01/01/2021   Procedure: CYSTOSCOPY WITH RETROGRADE PYELOGRAM, URETEROSCOPY AND STENT PLACEMENT;  Surgeon: Alexis Frock, MD;  Location: [facility];   Service: Urology;  Laterality: Right;  90 MINS   CYSTOSCOPY WITH RETROGRADE PYELOGRAM, URETEROSCOPY AND STENT PLACEMENT Right 01/10/2021   Procedure: CYSTOSCOPY WITH RETROGRADE PYELOGRAM, URETEROSCOPY AND STENT EXCHANGE;  Surgeon: Alexis Frock, MD;  Location: [facility];  Service: Urology;  Laterality: Right;   CYSTOSCOPY WITH URETEROSCOPY, STONE BASKETRY AND STENT PLACEMENT Right 09/27/2015   Procedure: 2ND STAGE CYSTOSCOPY RIGHT URETEROSCOPY STENT REPLACEMENT;  Surgeon: Alexis Frock, MD;  Location: [facility];  Service: Urology;  Laterality: Right;   CYSTOSCOPY/URETEROSCOPY/HOLMIUM LASER/STENT PLACEMENT Right 09/13/2015   Procedure: 1ST STAGE - CYSTOSCOPY/URETEROSCOPY/HOLMIUM LASER/STENT PLACEMENT;  Surgeon: Alexis Frock, MD;  Location: [facility];  Service: Urology;  Laterality: Right;   HOLMIUM LASER APPLICATION Right 58/52/7782   Procedure: HOLMIUM LASER APPLICATION;  Surgeon: Alexis Frock, MD;  Location: [facility];  Service: Urology;  Laterality: Right;   HOLMIUM LASER APPLICATION Right 42/35/3614   Procedure: HOLMIUM LASER;  Surgeon: Alexis Frock, MD;  Location: [facility];  Service: Urology;  Laterality: Right;   HOLMIUM LASER APPLICATION Right 4/31/5400   Procedure: 1ST STAGE HOLMIUM LASER APPLICATION;  Surgeon: Alexis Frock, MD;  Location: [facility];  Service: Urology;  Laterality: Right;   HOLMIUM LASER APPLICATION Right 02/08/7618   Procedure: HOLMIUM LASER APPLICATION;  Surgeon: Alexis Frock, MD;  Location: [facility];  Service: Urology;  Laterality: Right;   TOTAL SHOULDER ARTHROPLASTY Right 08/17/2013   RIGHT TOTAL SHOULDER ARTHROPLASTY;  Surgeon: [name], MD;  Location: [place];  Service: Orthopedics;  Laterality: Right;     Current Outpatient Medications  Medication Sig Dispense Refill   atorvastatin (LIPITOR) 20 MG tablet TAKE 1 TABLET BY MOUTH  EVERY DAY 90 tablet 3   fluticasone (FLONASE) 50 MCG/ACT nasal spray Place 1 spray into both nostrils daily as needed.      nadolol (CORGARD) 40 MG tablet Take 1 tablet (40 mg total) by mouth daily. 90 tablet 3   Potassium Citrate 15 MEQ (1620 MG) TBCR Take 2 tablets by mouth 2 (two) times daily.     sildenafil (REVATIO) 20 MG tablet Take 20 mg by mouth as needed (ed).     amoxicillin (AMOXIL) 500 MG capsule Take 2,000 mg by mouth as directed. (Patient not taking: No sig reported)     ketorolac (TORADOL) 10 MG tablet Take 1 tablet (10 mg total) by mouth every 8 (eight) hours as needed for moderate pain (or stent discomfort post-operatively). (Patient not taking: No sig reported) 20 tablet 0   oxyCODONE-acetaminophen (PERCOCET) 5-325 MG tablet Take 1 tablet by mouth every 6 (six) hours as needed for severe pain. Post-operatively (Patient not taking: No sig reported) 15 tablet 0   senna-docusate (SENOKOT-S) 8.6-50 MG tablet Take 1 tablet by mouth 2 (two) times daily. While taking strongest pain meds to prevent constipation (Patient not taking: No sig reported) 10 tablet 0   No current facility-administered medications for this visit.    Allergies:   Lisinopril    ROS:  Please see the history of present illness.   Otherwise, review of systems are positive for none.   All other systems are reviewed and negative.    PHYSICAL EXAM: VS:  BP (!) 140/92   Pulse 72   Ht 5\' 9"  (1.753 m)   Wt 182 lb 9.6 oz (82.8 kg)   SpO2 98%   BMI 26.97 kg/m  , BMI Body mass index is 26.97 kg/m. GENERAL:  Well appearing NECK:  No jugular venous distention, waveform within normal limits, carotid upstroke brisk and symmetric, no bruits, no thyromegaly LUNGS:  Clear to auscultation bilaterally CHEST:  Unremarkable HEART:  PMI not displaced or sustained,S1 and S2 within normal limits, no S3, no S4, no clicks, no rubs, no murmurs ABD:  Flat, positive bowel sounds normal in frequency in pitch, no bruits, no rebound,  no guarding, no midline pulsatile mass, no hepatomegaly, no splenomegaly EXT:  2 plus pulses throughout, no edema, no cyanosis no clubbing   EKG:  EKG is not  ordered today.    Recent Labs: 10/21/2020: Magnesium 1.8 03/31/2021: ALT 26; BUN 16; Creatinine, Ser 1.09; Hemoglobin 15.5; Platelets 208.0; Potassium 4.6; Sodium 139    Lipid Panel    Component Value Date/Time   CHOL 174 03/31/2021 0909   TRIG 197.0 (H) 03/31/2021 0909   HDL 61.70 03/31/2021 0909   CHOLHDL 3 03/31/2021 0909   VLDL 39.4 03/31/2021 0909   LDLCALC 73 03/31/2021 0909   LDLCALC 61 03/08/2020 1617   LDLDIRECT 85.0 11/30/2017 1409      Wt Readings from Last 3 Encounters:  04/07/21 182 lb 9.6 oz (82.8 kg)  03/31/21 179 lb 3.2 oz (81.3 kg)  02/13/21 176 lb (79.8 kg)      Other studies Reviewed: Additional studies/ records that were reviewed today include: MRI Review of the  above records demonstrates:  Please see elsewhere in the note.     ASSESSMENT AND PLAN:  SYNCOPE:   The etiology is still not clear.  There is no suggestive evidence either on the surface EKG previously or the MRI of ARVD.  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    He is still not driving.  He is going to come back and see Dr. Caryl Comes in about a month and we can then make referral likely genetic testing and make consideration for follow-up monitoring or stress testing on therapy.  He is still to avoid exercising at this point.    BRUIT: He had no evidence of carotid stenosis.    CORONARY CALCIUM:   There was no ischemia on POET (Plain Old Exercise Treadmill).  He will continue with risk reduction.     Current medicines are reviewed at length with the patient today.  The patient does not have concerns regarding medicines.  The following changes have been made: As above  Labs/ tests ordered today include:   No orders of the defined types were placed in this encounter.     Disposition:   FU with Dr. Caryl Comes in one  month.      Signed, Minus Breeding, MD  04/08/2021 10:52 AM    [facility]

## 2021-04-07 ENCOUNTER — Ambulatory Visit (INDEPENDENT_AMBULATORY_CARE_PROVIDER_SITE_OTHER): Payer: No Typology Code available for payment source | Admitting: Cardiology

## 2021-04-07 ENCOUNTER — Encounter: Payer: Self-pay | Admitting: Cardiology

## 2021-04-07 ENCOUNTER — Other Ambulatory Visit: Payer: Self-pay

## 2021-04-07 VITALS — BP 140/92 | HR 72 | Ht 69.0 in | Wt 182.6 lb

## 2021-04-07 DIAGNOSIS — I472 Ventricular tachycardia, unspecified: Secondary | ICD-10-CM | POA: Diagnosis not present

## 2021-04-07 DIAGNOSIS — R55 Syncope and collapse: Secondary | ICD-10-CM | POA: Diagnosis not present

## 2021-04-07 MED ORDER — NADOLOL 40 MG PO TABS: 40.0000 mg | ORAL_TABLET | Freq: Every day | ORAL | 3 refills | Status: DC

## 2021-04-07 NOTE — Patient Instructions (Signed)
Medication Instructions:  Stop Amlodipine 5 mg Start Corgard 40 mg daily  *If you need a refill on your cardiac medications before your next appointment, please call your pharmacy*   Lab Work: None ordered today   Testing/Procedures: None ordered today   Follow-Up: At [facility], you and your health needs are our priority.  As part of our continuing mission to provide you with exceptional heart care, we have created designated Provider Care Teams.  These Care Teams include your primary Cardiologist (physician) and Advanced Practice Providers (APPs -  Physician Assistants and Nurse Practitioners) who all work together to provide you with the care you need, when you need it.  We recommend signing up for the patient portal called "MyChart".  Sign up information is provided on this After Visit Summary.  MyChart is used to connect with patients for Virtual Visits (Telemedicine).  Patients are able to view lab/test results, encounter notes, upcoming appointments, etc.  Non-urgent messages can be sent to your provider as well.   To learn more about what you can do with MyChart, go to [email or web address].    Your next appointment:   1 month(s)  The format for your next appointment:   In Person  Provider:   [name], MD

## 2021-04-08 ENCOUNTER — Encounter: Payer: Self-pay | Admitting: Cardiology

## 2021-04-14 ENCOUNTER — Telehealth: Payer: Self-pay

## 2021-04-14 DIAGNOSIS — R55 Syncope and collapse: Secondary | ICD-10-CM

## 2021-04-14 DIAGNOSIS — I472 Ventricular tachycardia, unspecified: Secondary | ICD-10-CM

## 2021-04-14 NOTE — Telephone Encounter (Signed)
Attempted phone call to pt to notify of Dr [facility] request as below: Left voicemail message to contact RN at [phone].  Please call the patient and let him know that I spoke with Dr. Caryl Comes.  Dr. Caryl Comes and I would like to have a treadmill done now but the patient is on Corgard.  I would like for this to be done at [facility] on a day when Dr. Caryl Comes is there and the patient can be seen in follow-up on that day with Dr. Caryl Comes

## 2021-04-15 NOTE — Telephone Encounter (Signed)
Spoke with pt and advised of recommendation per Dr Percival Spanish for GXT. Pt is agreeable to scheduling.  Will forward to [facility] and [facility].

## 2021-04-16 ENCOUNTER — Other Ambulatory Visit: Payer: Self-pay | Admitting: *Deleted

## 2021-04-16 DIAGNOSIS — I4729 Other ventricular tachycardia: Secondary | ICD-10-CM

## 2021-04-29 ENCOUNTER — Other Ambulatory Visit: Payer: Self-pay

## 2021-04-29 ENCOUNTER — Ambulatory Visit (INDEPENDENT_AMBULATORY_CARE_PROVIDER_SITE_OTHER): Payer: No Typology Code available for payment source

## 2021-04-29 ENCOUNTER — Ambulatory Visit: Payer: No Typology Code available for payment source | Admitting: Internal Medicine

## 2021-04-29 DIAGNOSIS — R55 Syncope and collapse: Secondary | ICD-10-CM

## 2021-04-29 DIAGNOSIS — I472 Ventricular tachycardia, unspecified: Secondary | ICD-10-CM

## 2021-04-29 LAB — EXERCISE TOLERANCE TEST
Angina Index: 0
Estimated workload: 11.1
Exercise duration (min): 4 min
Exercise duration (sec): 40 s
MPHR: 162 {beats}/min
Peak HR: 104 {beats}/min
Percent HR: 64 %
RPE: 11.1
Rest HR: 67 {beats}/min

## 2021-05-07 ENCOUNTER — Ambulatory Visit: Payer: No Typology Code available for payment source | Admitting: Internal Medicine

## 2021-07-01 ENCOUNTER — Encounter: Payer: Self-pay | Admitting: Internal Medicine

## 2021-07-01 ENCOUNTER — Ambulatory Visit (INDEPENDENT_AMBULATORY_CARE_PROVIDER_SITE_OTHER): Payer: No Typology Code available for payment source | Admitting: Internal Medicine

## 2021-07-01 ENCOUNTER — Other Ambulatory Visit: Payer: Self-pay

## 2021-07-01 VITALS — BP 160/90 | HR 53 | Ht 69.0 in | Wt 184.8 lb

## 2021-07-01 DIAGNOSIS — R55 Syncope and collapse: Secondary | ICD-10-CM

## 2021-07-01 DIAGNOSIS — I493 Ventricular premature depolarization: Secondary | ICD-10-CM | POA: Diagnosis not present

## 2021-07-01 DIAGNOSIS — I429 Cardiomyopathy, unspecified: Secondary | ICD-10-CM

## 2021-07-01 DIAGNOSIS — I10 Essential (primary) hypertension: Secondary | ICD-10-CM | POA: Diagnosis not present

## 2021-07-01 NOTE — Patient Instructions (Signed)
Medication Instructions:   Your physician recommends that you continue on your current medications as directed. Please refer to the Current Medication list given to you today.  *If you need a refill on your cardiac medications before your next appointment, please call your pharmacy*   Lab Work:  Your physician recommends that you return for lab work in: [facility] April 2023 in our office (pre-procedure for cardiac MRI that will be early May) - (Hgb / Hct)    - This lab does not require fasting  Testing/Procedures:  You will be contacted to schedule your cardiac MRI early May of 2023.  You are scheduled for Cardiac MRI on _____________. Please arrive at the [facility] main entrance of [facility] at ____________ (30-45 minutes prior to test start time). ?  [facility]  [address]  [place], [place] [address]  [phone]  Proceed to the [facility] Radiology Department (First Floor).  ?  Magnetic resonance imaging (MRI) is a painless test that produces images of the inside of the body without using X-rays. During an MRI, strong magnets and radio waves work together in a Research officer, political party to form detailed images. MRI images may provide more details about a medical condition than X-rays, CT scans, and ultrasounds can provide.  You may be given earphones to listen for instructions.  You may eat a light breakfast and take medications as ordered with the exception of (fluid pill, other). If a contrast material will be used, an IV will be inserted into one of your veins. Contrast material will be injected into your IV.  You will be asked to remove all metal, including: Watch, jewelry, and other metal objects including hearing aids, hair pieces and dentures. (Braces and fillings normally are not a problem.)  If contrast material was used:  It will leave your body through your urine within a day. You may be told to drink plenty of fluids to help flush the contrast material out  of your system.  TEST WILL TAKE APPROXIMATELY 1 HOUR  PLEASE NOTIFY SCHEDULING AT LEAST 24 HOURS IN ADVANCE IF YOU ARE UNABLE TO KEEP YOUR APPOINTMENT.      Follow-Up: At [facility] you and your health needs are our priority.  As part of our continuing mission to provide you with exceptional heart care, we have created designated Provider Care Teams.  These Care Teams include your primary Cardiologist (physician) and Advanced Practice Providers (APPs -  Physician Assistants and Nurse Practitioners) who all work together to provide you with the care you need, when you need it.  We recommend signing up for the patient portal called "MyChart".  Sign up information is provided on this After Visit Summary.  MyChart is used to connect with patients for Virtual Visits (Telemedicine).  Patients are able to view lab/test results, encounter notes, upcoming appointments, etc.  Non-urgent messages can be sent to your provider as well.   To learn more about what you can do with MyChart, go to [email or web address].    Your next appointment:    Follow up late May 2023 after cardiac MRI  The format for your next appointment:   In Person  Provider:   [name], MD

## 2021-07-01 NOTE — Progress Notes (Signed)
Patient Care Team: Marin Olp, MD as PCP - General (Family Medicine) Minus Breeding, MD as PCP - Cardiology (Cardiology) Alexis Frock, MD as Consulting Physician (Urology)   HPI  [name] [name] is a 60 y.o. male seen in follow-up for syncope-abrupt in onset and offset  4/22 and found on evaluation to have frequent PVCs and nonsustained VT. 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 and started Rx with *nadolol*   Repeat GXT >> only monomorphic on therapy  DATE TEST EF%    6/22 Echo   50-55%     10/22 cMRI 44%  Global HK LGE-diffuse w/o pattern for myocarditis or ARVC         Date Cr K Hgb  09/21 1.17 3.9 14.8  07/22 0.90 4.1 16.7    Records and Results Reviewed (As above)   Past Medical History:  Diagnosis Date   Arthritis    ED (erectile dysfunction)    History of adenomatous polyp of colon    05-27-2015   History of gout    no issue since 2012   History of syncope 10/2020   pcp referred pt to cardiology, seen by dr hochrein, note 12-26-2020 in epic;  work-up includes event monitor 12-03-2020 showed 33% PVCs w/ occ nonsustained VT runs,  echo 12-04-2020 ef 55-55% freq PVCs GiDD,  ETT 12-27-2020 showed negative for ischemia   Hyperlipidemia    Hypertension    Raynaud's syndrome    Renal calculus, right    Testicular hypogonadism    Wears glasses     Past Surgical History:  Procedure Laterality Date   BICEPS TENDON REPAIR Right 2003   COLONOSCOPY  05/27/2015   CYSTOSCOPY WITH RETROGRADE PYELOGRAM, URETEROSCOPY AND STENT PLACEMENT Right 01/01/2021   Procedure: CYSTOSCOPY WITH RETROGRADE PYELOGRAM, URETEROSCOPY AND STENT PLACEMENT;  Surgeon: Alexis Frock, MD;  Location: [facility];  Service: Urology;  Laterality: Right;  90 MINS   CYSTOSCOPY WITH RETROGRADE PYELOGRAM, URETEROSCOPY AND STENT PLACEMENT Right 01/10/2021   Procedure: CYSTOSCOPY WITH RETROGRADE PYELOGRAM, URETEROSCOPY AND STENT EXCHANGE;  Surgeon: Alexis Frock, MD;  Location: [facility];  Service: Urology;  Laterality: Right;   CYSTOSCOPY WITH URETEROSCOPY, STONE BASKETRY AND STENT PLACEMENT Right 09/27/2015   Procedure: 2ND STAGE CYSTOSCOPY RIGHT URETEROSCOPY STENT REPLACEMENT;  Surgeon: Alexis Frock, MD;  Location: [facility];  Service: Urology;  Laterality: Right;   CYSTOSCOPY/URETEROSCOPY/HOLMIUM LASER/STENT PLACEMENT Right 09/13/2015   Procedure: 1ST STAGE - CYSTOSCOPY/URETEROSCOPY/HOLMIUM LASER/STENT PLACEMENT;  Surgeon: Alexis Frock, MD;  Location: [facility];  Service: Urology;  Laterality: Right;   HOLMIUM LASER APPLICATION Right 96/22/2979   Procedure: HOLMIUM LASER APPLICATION;  Surgeon: Alexis Frock, MD;  Location: [facility];  Service: Urology;  Laterality: Right;   HOLMIUM LASER APPLICATION Right 89/21/1941   Procedure: HOLMIUM LASER;  Surgeon: Alexis Frock, MD;  Location: [facility];  Service: Urology;  Laterality: Right;   HOLMIUM LASER APPLICATION Right 7/40/8144   Procedure: 1ST STAGE HOLMIUM LASER APPLICATION;  Surgeon: Alexis Frock, MD;  Location: [facility];  Service: Urology;  Laterality: Right;   HOLMIUM LASER APPLICATION Right 02/03/8562   Procedure: HOLMIUM LASER  APPLICATION;  Surgeon: Alexis Frock, MD;  Location: [facility];  Service: Urology;  Laterality: Right;   TOTAL SHOULDER ARTHROPLASTY Right 08/17/2013   RIGHT TOTAL SHOULDER ARTHROPLASTY;  Surgeon: [name], MD;  Location: [place];  Service: Orthopedics;  Laterality: Right;    Current Meds  Medication Sig   amoxicillin (AMOXIL) 500 MG capsule Take 2,000 mg by mouth as directed.   atorvastatin  (LIPITOR) 20 MG tablet TAKE 1 TABLET BY MOUTH EVERY DAY   fluticasone (FLONASE) 50 MCG/ACT nasal spray Place 1 spray into both nostrils daily as needed.    nadolol (CORGARD) 40 MG tablet Take 1 tablet (40 mg total) by mouth daily.   Potassium Citrate 15 MEQ (1620 MG) TBCR Take 2 tablets by mouth 2 (two) times daily.   sildenafil (REVATIO) 20 MG tablet Take 20 mg by mouth as needed (ed).    Allergies  Allergen Reactions   Lisinopril Cough      Review of Systems negative except from HPI and PMH  Physical Exam BP (!) 160/90    Pulse (!) 53    Ht 5\' 9"  (1.753 m)    Wt 184 lb 12.8 oz (83.8 kg)    SpO2 98%    BMI 27.29 kg/m  Well developed and well nourished in no acute distress HENT normal E scleral and icterus clear Neck Supple JVP flat; carotids brisk and full Clear to ausculation Regular rate and rhythm, no murmurs gallops or rub Soft with active bowel sounds No clubbing cyanosis  Edema Alert and oriented, grossly normal motor and sensory function Skin Warm and Dry  ECG sinus at 53 Intervals 18/10/43  CrCl cannot be calculated (Patient's most recent lab result is older than the maximum 21 days allowed.).   Assessment and  Plan Syncope abrupt onset and offset   PVCs-dimorphic morphology right bundle inferior axis with upright QRS lead aVL versus negative QRS in lead aVL   Exercise-induced biventricular, alternating axis PVCs   Cardiomyopathy-mild  Raynaud's   Hypertension  PVCs been symptomatically quiescient; no interval syncope.  Continue nadolol 40 mg daily.  Has developed cold intolerance of his fingers and toes; some discoloration.  Probably Raynaud's.  Suspect aggravated by his nadolol.  May need to change to verapamil.  Blood pressure significantly elevated, on recheck today was greater than 180.  Rather than add another medication at this time, he would like to see what it is at home; not sanguine that it will be sufficiently loaded to make a difference.  He  will let us know next month.  With his cardiomyopathy, the EF by MR about 44% by echo 50-55%; suspect the former is more accurate.  We will recheck his MRI 5/23 for both EF as well as LGE.  This may inform additional medical therapies  Current medicines are reviewed at length with the patient today .  The patient does not  have concerns regarding medicines.

## 2021-07-15 ENCOUNTER — Ambulatory Visit: Payer: No Typology Code available for payment source | Admitting: Genetic Counselor

## 2021-07-15 ENCOUNTER — Other Ambulatory Visit: Payer: Self-pay

## 2021-07-16 NOTE — Progress Notes (Signed)
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 Caucasian gentleman works for a [employer], children[employer]. He tells me that in April 2022 he was blowing off the leaves at a golf course in [facility] for about 45 minutes when he face-planted. He has no recollection of the event. Per the guy on the machine at the course, he fell face first on the grass and then got up a few seconds later. He followed up with his PCP who performed an EKG that demonstrated PVCs. He was then referred to Dr. 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 He reports having no symptoms prior to this syncope. He reports having PVCs in his late 46s and tells me that he has no functional limitations and leads a very active life.   Traditional Risk Factors Exercise and emotional stress can elicit bidirectional tachycardia in CPVT patients. [name] [name] states that he was not feeling physically tired or challenged at the time of the event but does state that he had been experiencing emotional stress related to his son.  Family history [name] [name] (III.3) has a 27 y.o. son (IV.5) and 87 y.o. daughter (IV.6)- both are in good health. He has two older sisters, ages 49 and 80 (III.1, III.2) and a 59 y.o. younger brother (III.4). His siblings and their children are in good health.  [name] [name]'s parents (II.3, II.4) are now 15 and have CAD. His father had a 5-vessel CABG at age 72 and mother had a stent placed at age 60. His paternal uncles (II.10II.2) are in their 67s and in good health. Paternal grandfather (I.1) died at 1 from a heart attack and grandmother (I.2) lived to 80.  There is no history of heart disease in his maternal aunts (II.5-II.7) and uncles (II.8-II.9). Maternal grandfather  (I.3) died at 61 from complications of a stoke he had at 60. Maternal grandmother (I.4) lived to 25.  He reports no instances of sudden death amongst his maternal and paternal cousins or their children (not shown on pedigree)   [name] [name] was counseled on the genetics of Catecholaminergic Polymorphic Ventricular Tachycardia (CPVT). I explained to him that this condition is caused by loss of function mutations in the calcium ion gated channel protein and calsequestrin protein. Mutations in RYR2 gene that encodes the cardiac ryanodine receptor (RyR2) are inherited in an autosomal dominant manner. Less commonly, mutations in CASQ2 gene that encodes the cardiac calseqestrin protein are inherited in an autosomal recessive manner.   I explained to him that mutations in RYR2 or CASQ2 genes leads to an aberrant increase of calcium ions in the sarcoplasmic reticulum ultimately resulting in bidirectional or polymorphic tachycardia. informed him that this disease usually presents in teenage years, with mean onset of symptoms such as syncope occurring at a mean age of 90 +/- 10 years. However, late onset in the fourth decade or later of life has been reported. CPVT, if untreated, is highly lethal, as approximately 30% of affected individuals experience at least one cardiac arrest and up to 80% have one or more syncopal spells. Sudden death may be the first manifestation of the disease.  Penetrance for RYR2 pathogenic variants is 11%, while biallelic CASQ2 pathogenic variants display 100% Based on his family history with no reports of sudden death,  sudden cardiac arrest or syncope, it is likely that he has a de novo mutation or perhaps the family has the autosomal recessive CASQ2 mutation. I also discussed appropriate follow up in family members if a mutation was found in either the autosomal dominant RYR2 or autosomal recessive CASQ2 gene. He tells me that his siblings would be open to undergo genetic testing. It  would be preferable if they undergo a maximal exercise stress test to confirm mutation status. He verbalized understanding.   We walked through the process of genetic testing.   I explained to him that genetic testing is a probabilistic test dependent upon his age and severity of presentation, presence of risk factors and importantly family history of sudden death in first-degree relatives.  The potential outcomes of genetic testing and subsequent management of at-risk family members were discussed so as to manage expectations-  I explained to him that if a mutation is not identified, then first-degree relatives need not undergo further cardiac testing unless they present with symptoms. He verbalized understanding of this.  There is also the likelihood of identifying a Variant of unknown significance. This result means that the variant has not been detected in a statistically significant number of patients and/or functional studies have not been performed to verify its pathogenicity. This VUS can be tested in the family to see if it segregates with disease.   If a pathogenic variant is reported, then his first-degree family members can get tested for this variant. If they test positive, it is likely they will develop CPVT. In light of variable expression and incomplete penetrance associated with this condition, it is not possible to predict when they will manifest clinically.    Impression  [name] [name] had a syncopal episode at age 68 and subsequent imaging studies raised the suspicion of CPVT. However, there is no family history of sudden death his first-degree relatives. Genetic testing for CPVT s recommended.   In addition, we discussed the protections afforded by the Genetic Information Non-Discrimination Act (GINA). I explained to her that GINA protects her from losing her employment or health insurance based on her genotype. However, these protections do not cover life insurance and disability.  He verbalized understanding of this and states that while his daughter has life insurance, he is unsure about his son.  Please note that the patient has not been counseled in this visit on personal, cultural or ethical issues that he may face due to his heart condition.   Plan After a thorough discussion of the risk and benefits of genetic testing for CPVT, [name] [name] states his intent to pursue genetic testing for CPVT and signed the informed consent form. Blood was drawn today for testing.    Lattie Corns, Ph.D, [facility] Clinical Molecular Geneticist

## 2021-08-26 ENCOUNTER — Telehealth: Payer: Self-pay | Admitting: Internal Medicine

## 2021-08-26 NOTE — Telephone Encounter (Signed)
Spoke with pt who states his BP continues to be elevated.  He does not have a log of blood pressure readings but does report most recent highest and lowest BP of 181/104 and the lowest has been 141/95.  Pt denies current CP, edema or headache. Encouraged pt to check BP daily for the next 5 days about 1.5-2 hours after he has take medication.  Discussed decreasing  sodium intake.  Pt states he tries not to add salt to food but does have a weakness for [place] food.  He drinks mostly water and lemonade. Will forward information to Dr Caryl Comes and Dr Percival Spanish as Dr Percival Spanish had stopped Amlodipine and started Nadolol at pt's 04/07/2022 visit. Pt verbalizes understanding and agrees with current plan. He will send additional BP readings by MyChart.

## 2021-08-26 NOTE — Telephone Encounter (Signed)
Pt c/o BP issue: STAT if pt c/o blurred vision, one-sided weakness or slurred speech  1. What are your last 5 BP readings?   2. Are you having any other symptoms (ex. Dizziness, headache, blurred vision, passed out)?  Patient is asymptomatic   3. What is your BP issue?   Patient is calling to report BP readings, as discussed during 07/01/21 appointment with Dr. Caryl Comes. He states the highest his BP has gotten since OV was 181/104 and the lowest has been 141/95. BP is still elevated, patient requesting further recommendation from Dr. Caryl Comes.

## 2021-08-27 MED ORDER — AMLODIPINE BESYLATE 5 MG PO TABS: 5.0000 mg | ORAL_TABLET | Freq: Every day | ORAL | 3 refills | Status: DC

## 2021-08-27 NOTE — Telephone Encounter (Signed)
Spoke with pt and advised per Dr Caryl Comes restart Amlodipine 5mg  - 1 tablet by mouth daily and continue daily monitoring of BP.  Pt verbalizes understanding and agrees with current plan.

## 2021-09-03 DIAGNOSIS — N200 Calculus of kidney: Secondary | ICD-10-CM

## 2021-09-03 HISTORY — DX: Calculus of kidney: N20.0

## 2021-10-17 ENCOUNTER — Telehealth: Payer: Self-pay | Admitting: Internal Medicine

## 2021-10-17 NOTE — Telephone Encounter (Signed)
Called to schedule labs due in April pre MRI ? ? ?lmov ?

## 2021-10-22 ENCOUNTER — Other Ambulatory Visit: Payer: No Typology Code available for payment source

## 2021-10-22 DIAGNOSIS — I429 Cardiomyopathy, unspecified: Secondary | ICD-10-CM

## 2021-10-22 LAB — HEMOGLOBIN AND HEMATOCRIT, BLOOD
Hematocrit: 44.9 % (ref 37.5–51.0)
Hemoglobin: 14.9 g/dL (ref 13.0–17.7)

## 2021-11-05 NOTE — Telephone Encounter (Signed)
Labs already done at the [facility] office on 10/22/21. ?

## 2021-11-07 ENCOUNTER — Telehealth (HOSPITAL_COMMUNITY): Payer: Self-pay | Admitting: *Deleted

## 2021-11-07 NOTE — Telephone Encounter (Signed)
Reaching out to patient to offer assistance regarding upcoming cardiac imaging study; pt verbalizes understanding of appt date/time, parking situation and where to check in, name and call back number provided for further questions should they arise ? ?Gordy Clement RN Navigator Cardiac Imaging ?[facility] ?[phone] office ?[phone] cell ? ?Patient had a cardiac MRI in September of 2022 without issue. ?

## 2021-11-10 ENCOUNTER — Ambulatory Visit (HOSPITAL_COMMUNITY)
Admission: RE | Admit: 2021-11-10 | Discharge: 2021-11-10 | Disposition: A | Discharge status: Home / Self Care | Payer: No Typology Code available for payment source | Source: Ambulatory Visit | Attending: Internal Medicine | Admitting: Internal Medicine

## 2021-11-10 DIAGNOSIS — I429 Cardiomyopathy, unspecified: Secondary | ICD-10-CM | POA: Diagnosis present

## 2021-11-10 MED ORDER — GADOBUTROL 1 MMOL/ML IV SOLN
10.0000 mL | Freq: Once | INTRAVENOUS | Status: AC | PRN
  Administered 2021-11-10: 10 mL via INTRAVENOUS

## 2021-12-08 DIAGNOSIS — I493 Ventricular premature depolarization: Secondary | ICD-10-CM | POA: Insufficient documentation

## 2021-12-08 DIAGNOSIS — I429 Cardiomyopathy, unspecified: Secondary | ICD-10-CM | POA: Insufficient documentation

## 2021-12-15 ENCOUNTER — Encounter: Payer: Self-pay | Admitting: Internal Medicine

## 2021-12-15 ENCOUNTER — Ambulatory Visit (INDEPENDENT_AMBULATORY_CARE_PROVIDER_SITE_OTHER): Payer: No Typology Code available for payment source | Admitting: Internal Medicine

## 2021-12-15 VITALS — BP 140/86 | HR 61 | Ht 69.0 in | Wt 183.4 lb

## 2021-12-15 DIAGNOSIS — I493 Ventricular premature depolarization: Secondary | ICD-10-CM

## 2021-12-15 DIAGNOSIS — I429 Cardiomyopathy, unspecified: Secondary | ICD-10-CM

## 2021-12-15 DIAGNOSIS — R55 Syncope and collapse: Secondary | ICD-10-CM | POA: Diagnosis not present

## 2021-12-15 NOTE — Patient Instructions (Signed)
Medication Instructions:   Your physician has recommended you make the following change in your medication:  ** Hold Nadolol You are being given three prescriptions for different beta blockers to try.  You may take these in any order. DO NOT TAKE MORE THAN ONE BETA BLOCKER AT A TIME. Choose one beta blocker to start with. If after two weeks, you feel the medication is working well for you, continue that current medication. If it does not work well for you, try another prescription for a beta blocker at that time. You may continue that current beta blocker at that time if it works well for you. If it does not, repeat these instructions for the third beta blocker.   When you find a beta blocker that works well for you, call the office and we will send in a prescription for you.                1.  Nebivolol '5mg'$              2.  Inderal LA '80mg'$              3.  Bisoprolol '5mg'$  tablet   You may take these in any order you please.    Please call us with any questions.    *If you need a refill on your cardiac medications before your next appointment, please call your pharmacy*   Lab Work: None ordered.  If you have labs (blood work) drawn today and your tests are completely normal, you will receive your results only by: [place] (if you have MyChart) OR A paper copy in the mail If you have any lab test that is abnormal or we need to change your treatment, we will call you to review the results.   Testing/Procedures: None ordered.    Follow-Up: At [facility], you and your health needs are our priority.  As part of our continuing mission to provide you with exceptional heart care, we have created designated Provider Care Teams.  These Care Teams include your primary Cardiologist (physician) and Advanced Practice Providers (APPs -  Physician Assistants and Nurse Practitioners) who all work together to provide you with the care you need, when you need it.  We recommend signing  up for the patient portal called "MyChart".  Sign up information is provided on this After Visit Summary.  MyChart is used to connect with patients for Virtual Visits (Telemedicine).  Patients are able to view lab/test results, encounter notes, upcoming appointments, etc.  Non-urgent messages can be sent to your provider as well.   To learn more about what you can do with MyChart, go to [email or web address].    Your next appointment:   Follow up as scheduled  Important Information About Sugar

## 2021-12-15 NOTE — Progress Notes (Signed)
Patient Care Team: Marin Olp, MD as PCP - General (Family Medicine) Minus Breeding, MD as PCP - Cardiology (Cardiology) Alexis Frock, MD as Consulting Physician (Urology)   HPI  [name] [name] is a 61 y.o. male seen in follow-up for syncope-abrupt in onset and offset  4/22 and found on evaluation to have frequent PVCs and nonsustained VT. 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  DATE TEST EF%    6/22 Echo   50-55%     10/22 cMRI 44%  Global HK LGE-diffuse w/o pattern for myocarditis or ARVC  5/23 cMRI 49% LGE     Date Cr K Hgb  09/21 1.17 3.9 14.8  07/22 0.90 4.1 16.7  4/23   14.9    Records and Results Reviewed (As above)   Past Medical History:  Diagnosis Date   Arthritis    ED (erectile dysfunction)    History of adenomatous polyp of colon    05-27-2015   History of gout    no issue since 2012   History of syncope 10/2020   pcp referred pt to cardiology, seen by dr hochrein, note 12-26-2020 in epic;  work-up includes event monitor 12-03-2020 showed 33% PVCs w/ occ nonsustained VT runs,  echo 12-04-2020 ef 55-55% freq PVCs GiDD,  ETT 12-27-2020 showed negative for ischemia   Hyperlipidemia    Hypertension    Raynaud's syndrome    Renal calculus, right    Testicular hypogonadism    Wears glasses     Past Surgical History:  Procedure Laterality Date   BICEPS TENDON REPAIR Right 2003   COLONOSCOPY  05/27/2015   CYSTOSCOPY WITH RETROGRADE PYELOGRAM, URETEROSCOPY  AND STENT PLACEMENT Right 01/01/2021   Procedure: CYSTOSCOPY WITH RETROGRADE PYELOGRAM, URETEROSCOPY AND STENT PLACEMENT;  Surgeon: Alexis Frock, MD;  Location: [facility];  Service: Urology;  Laterality: Right;  90 MINS   CYSTOSCOPY WITH RETROGRADE PYELOGRAM, URETEROSCOPY AND STENT PLACEMENT Right 01/10/2021   Procedure: CYSTOSCOPY WITH RETROGRADE PYELOGRAM, URETEROSCOPY AND STENT EXCHANGE;  Surgeon: Alexis Frock, MD;  Location[facility];  Service: Urology;  Laterality: Right;   CYSTOSCOPY WITH URETEROSCOPY, STONE BASKETRY AND STENT PLACEMENT Right 09/27/2015   Procedure: 2ND STAGE CYSTOSCOPY RIGHT URETEROSCOPY STENT REPLACEMENT;  Surgeon: Alexis Frock, MD;  Location: [facility];  Service: Urology;  Laterality: Right;   CYSTOSCOPY/URETEROSCOPY/HOLMIUM LASER/STENT PLACEMENT Right 09/13/2015   Procedure: 1ST STAGE - CYSTOSCOPY/URETEROSCOPY/HOLMIUM LASER/STENT PLACEMENT;  Surgeon: Alexis Frock, MD;  Location: [facility];  Service: Urology;  Laterality: Right;   HOLMIUM LASER APPLICATION Right 49/17/9150   Procedure: HOLMIUM LASER APPLICATION;  Surgeon: Alexis Frock, MD;  Location: [facility];  Service: Urology;  Laterality: Right;   HOLMIUM LASER APPLICATION Right 56/97/9480   Procedure: HOLMIUM LASER;  Surgeon: Alexis Frock, MD;  Location: [facility];  Service: Urology;  Laterality: Right;   HOLMIUM LASER APPLICATION Right 1/65/5374   Procedure: 1ST STAGE HOLMIUM  LASER APPLICATION;  Surgeon: Alexis Frock, MD;  Location: [facility];  Service: Urology;  Laterality: Right;   HOLMIUM LASER APPLICATION Right 11/06/84   Procedure: HOLMIUM LASER APPLICATION;  Surgeon: Alexis Frock, MD;  Location: [facility];  Service: Urology;  Laterality: Right;   TOTAL SHOULDER ARTHROPLASTY Right 08/17/2013   RIGHT TOTAL SHOULDER ARTHROPLASTY;  Surgeon: [name], MD;   Location: [place];  Service: Orthopedics;  Laterality: Right;    Current Meds  Medication Sig   amLODipine (NORVASC) 5 MG tablet Take 1 tablet (5 mg total) by mouth daily.   atorvastatin (LIPITOR) 20 MG tablet TAKE 1 TABLET BY MOUTH EVERY DAY   fluticasone (FLONASE) 50 MCG/ACT nasal spray Place 1 spray into both nostrils daily as needed.    nadolol (CORGARD) 40 MG tablet Take 1 tablet (40 mg total) by mouth daily.   Potassium Citrate 15 MEQ (1620 MG) TBCR Take 2 tablets by mouth 2 (two) times daily.   sildenafil (REVATIO) 20 MG tablet Take 20 mg by mouth as needed (ed).    Allergies  Allergen Reactions   Lisinopril Cough      Review of Systems negative except from HPI and PMH  Physical Exam BP 140/86   Pulse 61   Ht 5' 9"  (1.753 m)   Wt 183 lb 6.4 oz (83.2 kg)   SpO2 93%   BMI 27.08 kg/m  Well developed and well nourished in no acute distress HENT normal E scleral and icterus clear Neck Supple JVP flat; carotids brisk and full Clear to ausculation Regular rate and rhythm, no murmurs gallops or rub Soft with active bowel sounds No clubbing cyanosis  Edema Alert and oriented, grossly normal motor and sensory function Skin Warm and Dry  ECG sinus at 61 19/10/41  CrCl cannot be calculated (Patient's most recent lab result is older than the maximum 21 days allowed.).   Assessment and  Plan Syncope abrupt onset and offset   PVCs-dimorphic morphology right bundle inferior axis with upright QRS lead aVL versus negative QRS in lead aVL  cMRI abnormal    Exercise-induced biventricular, alternating axis PVCs   Cardiomyopathy-mild  Raynaud's   Hypertension  Fatigue  Changing hemoglobin  Blood pressure is much improved.  120s-130s at home, we will continue him on his beta-blocker and his amlodipine.  Significant fatigue attributed to the beta-blockers, his PVCs are quiescient there is no interval syncope.  We will hold the nadolol, and given prescription for  bisoprolol 5, nebivolol 5, and Inderal LA 80 to take in random order and he will let us know how he does; if he does not tolerate any of these, we will change him to calcium blocker   The Raynaud's may also prompt a change to a calcium blocker, specifically verapamil.  His cardiomyopathy is improved by cMRI, have reviewed this with Dr. Owens Shark.  LGE is less EF is better.  Interestingly, I did a quick literature search, and there are reports of patients meeting criteria for CPVT who have structural abnormalities as suggested by cMRI including noncompaction.  Reversible cardiomyopathies did not show up at least on the brief search but it does highlight the possibility that the phenotype of CPVT can be associated with structural issues.  He met with Dr. Broadus John regarding genetic testing, these results are not available; I have reached out to her.  I will reach out to Dr. Yong Channel, I am intrigued that his hemoglobin has drifted towards normal range from a apogee  of 17.9.  Not sure if this is real, as the hemoglobins a few years ago were also in the 14 range.

## 2021-12-17 ENCOUNTER — Ambulatory Visit (INDEPENDENT_AMBULATORY_CARE_PROVIDER_SITE_OTHER)
Admission: RE | Admit: 2021-12-17 | Discharge: 2021-12-17 | Disposition: A | Discharge status: Home / Self Care | Payer: No Typology Code available for payment source | Source: Ambulatory Visit | Attending: Acute Care | Admitting: Acute Care

## 2021-12-17 DIAGNOSIS — F17210 Nicotine dependence, cigarettes, uncomplicated: Secondary | ICD-10-CM

## 2021-12-19 ENCOUNTER — Other Ambulatory Visit: Payer: Self-pay

## 2021-12-19 DIAGNOSIS — Z87891 Personal history of nicotine dependence: Secondary | ICD-10-CM

## 2021-12-19 DIAGNOSIS — Z122 Encounter for screening for malignant neoplasm of respiratory organs: Secondary | ICD-10-CM

## 2022-01-01 ENCOUNTER — Ambulatory Visit: Payer: No Typology Code available for payment source | Admitting: Internal Medicine

## 2022-01-09 ENCOUNTER — Other Ambulatory Visit: Payer: Self-pay | Admitting: Internal Medicine

## 2022-01-09 NOTE — Telephone Encounter (Signed)
Spoke with pt who states he has decided to continue on Nebivolol '5mg'$  - 1 tablet by mouth daily.  Pt advised office will authorize a refill on this medication and not the Bisoprolol.  Pt thanked Therapist, sports for the call.

## 2022-03-14 ENCOUNTER — Other Ambulatory Visit: Payer: Self-pay | Admitting: Family Medicine

## 2022-03-30 ENCOUNTER — Encounter: Payer: Self-pay | Admitting: *Deleted

## 2022-04-01 ENCOUNTER — Ambulatory Visit (INDEPENDENT_AMBULATORY_CARE_PROVIDER_SITE_OTHER): Payer: No Typology Code available for payment source | Admitting: Family Medicine

## 2022-04-01 ENCOUNTER — Encounter: Payer: Self-pay | Admitting: Family Medicine

## 2022-04-01 VITALS — BP 138/70 | HR 66 | Temp 97.6°F | Ht 69.0 in | Wt 187.6 lb

## 2022-04-01 DIAGNOSIS — Z125 Encounter for screening for malignant neoplasm of prostate: Secondary | ICD-10-CM

## 2022-04-01 DIAGNOSIS — E785 Hyperlipidemia, unspecified: Secondary | ICD-10-CM

## 2022-04-01 DIAGNOSIS — J439 Emphysema, unspecified: Secondary | ICD-10-CM

## 2022-04-01 DIAGNOSIS — R739 Hyperglycemia, unspecified: Secondary | ICD-10-CM

## 2022-04-01 DIAGNOSIS — Z0000 Encounter for general adult medical examination without abnormal findings: Secondary | ICD-10-CM

## 2022-04-01 DIAGNOSIS — Z23 Encounter for immunization: Secondary | ICD-10-CM

## 2022-04-01 DIAGNOSIS — M1A09X0 Idiopathic chronic gout, multiple sites, without tophus (tophi): Secondary | ICD-10-CM

## 2022-04-01 DIAGNOSIS — Z720 Tobacco use: Secondary | ICD-10-CM

## 2022-04-01 DIAGNOSIS — E119 Type 2 diabetes mellitus without complications: Secondary | ICD-10-CM | POA: Insufficient documentation

## 2022-04-01 LAB — COMPREHENSIVE METABOLIC PANEL
ALT: 29 U/L (ref 0–53)
AST: 27 U/L (ref 0–37)
Albumin: 4.5 g/dL (ref 3.5–5.2)
Alkaline Phosphatase: 73 U/L (ref 39–117)
BUN: 18 mg/dL (ref 6–23)
CO2: 28 mEq/L (ref 19–32)
Calcium: 9.7 mg/dL (ref 8.4–10.5)
Chloride: 102 mEq/L (ref 96–112)
Creatinine, Ser: 1.12 mg/dL (ref 0.40–1.50)
GFR: 71.83 mL/min (ref >60.00)
Glucose, Bld: 113 mg/dL — ABNORMAL HIGH (ref 70–99)
Potassium: 4.6 mEq/L (ref 3.5–5.1)
Sodium: 137 mEq/L (ref 135–145)
Total Bilirubin: 0.5 mg/dL (ref 0.2–1.2)
Total Protein: 7.5 g/dL (ref 6.0–8.3)

## 2022-04-01 LAB — CBC WITH DIFFERENTIAL/PLATELET
Basophils Absolute: 0.1 10*3/uL (ref 0.0–0.1)
Basophils Relative: 1.1 % (ref 0.0–3.0)
Eosinophils Absolute: 0.5 10*3/uL (ref 0.0–0.7)
Eosinophils Relative: 5.3 % — ABNORMAL HIGH (ref 0.0–5.0)
HCT: 45.3 % (ref 39.0–52.0)
Hemoglobin: 15 g/dL (ref 13.0–17.0)
Lymphocytes Relative: 18.6 % (ref 12.0–46.0)
Lymphs Abs: 1.6 10*3/uL (ref 0.7–4.0)
MCHC: 33.1 g/dL (ref 30.0–36.0)
MCV: 90.4 fl (ref 78.0–100.0)
Monocytes Absolute: 0.6 10*3/uL (ref 0.1–1.0)
Monocytes Relative: 6.3 % (ref 3.0–12.0)
Neutro Abs: 6.1 10*3/uL (ref 1.4–7.7)
Neutrophils Relative %: 68.7 % (ref 43.0–77.0)
Platelets: 249 10*3/uL (ref 150.0–400.0)
RBC: 5.01 Mil/uL (ref 4.22–5.81)
RDW: 13.5 % (ref 11.5–15.5)
WBC: 8.8 10*3/uL (ref 4.0–10.5)

## 2022-04-01 LAB — LDL CHOLESTEROL, DIRECT: Direct LDL: 83 mg/dL

## 2022-04-01 LAB — URIC ACID: Uric Acid, Serum: 8.4 mg/dL — ABNORMAL HIGH (ref 4.0–7.8)

## 2022-04-01 LAB — LIPID PANEL
Cholesterol: 167 mg/dL (ref 0–200)
HDL: 55.3 mg/dL (ref >39.00)
NonHDL: 111.9
Total CHOL/HDL Ratio: 3
Triglycerides: 232 mg/dL — ABNORMAL HIGH (ref 0.0–149.0)
VLDL: 46.4 mg/dL — ABNORMAL HIGH (ref 0.0–40.0)

## 2022-04-01 LAB — HEMOGLOBIN A1C: Hgb A1c MFr Bld: 6.7 % — ABNORMAL HIGH (ref 4.6–6.5)

## 2022-04-01 NOTE — Progress Notes (Signed)
Phone: [phone]   Subjective:  Patient presents today for their annual physical. Chief complaint-noted.   See problem oriented charting- ROS- full  review of systems was completed and negative  except for: congestion, seasonal allergies, leg swelling mild on amlodipine  The following were reviewed and entered/updated in epic: Past Medical History:  Diagnosis Date   Arthritis    ED (erectile dysfunction)    History of adenomatous polyp of colon    05-27-2015   History of gout    no issue since 2012   History of syncope 10/2020   pcp referred pt to cardiology, seen by dr hochrein, note 12-26-2020 in epic;  work-up includes event monitor 12-03-2020 showed 33% PVCs w/ occ nonsustained VT runs,  echo 12-04-2020 ef 55-55% freq PVCs GiDD,  ETT 12-27-2020 showed negative for ischemia   Hyperlipidemia    Hypertension    Raynaud's syndrome    Renal calculus, right    Testicular hypogonadism    Wears glasses    Patient Active Problem List   Diagnosis Date Noted   Emphysema of lung ([place]) 03/08/2020    Priority: High   Tobacco abuse 08/13/2014    Priority: High   Hyperglycemia 04/01/2022    Priority: Medium    Hyperlipidemia 05/26/2010    Priority: Medium    Gout 05/26/2010    Priority: Medium    Essential hypertension 05/26/2010    Priority: Medium    History of adenomatous polyp of colon 06/03/2015    Priority: Low   Allergic rhinitis 08/13/2014    Priority: Low   S/P shoulder replacement 08/17/2013    Priority: Low   Acne rosacea 05/05/2013    Priority: Low   PVC's (premature ventricular contractions) 12/08/2021   Cardiomyopathy ([place]) 12/08/2021   Syncope 04/06/2021   Ventricular tachycardia ([place]) 04/06/2021   Past Surgical History:  Procedure Laterality Date   BICEPS TENDON REPAIR Right 2003   COLONOSCOPY  05/27/2015   CYSTOSCOPY WITH RETROGRADE PYELOGRAM, URETEROSCOPY AND STENT PLACEMENT Right 01/01/2021   Procedure: CYSTOSCOPY WITH RETROGRADE PYELOGRAM,  URETEROSCOPY AND STENT PLACEMENT;  Surgeon: Alexis Frock, MD;  Location: [facility];  Service: Urology;  Laterality: Right;  90 MINS   CYSTOSCOPY WITH RETROGRADE PYELOGRAM, URETEROSCOPY AND STENT PLACEMENT Right 01/10/2021   Procedure: CYSTOSCOPY WITH RETROGRADE PYELOGRAM, URETEROSCOPY AND STENT EXCHANGE;  Surgeon: Alexis Frock, MD;  Location: [facility];  Service: Urology;  Laterality: Right;   CYSTOSCOPY WITH URETEROSCOPY, STONE BASKETRY AND STENT PLACEMENT Right 09/27/2015   Procedure: 2ND STAGE CYSTOSCOPY RIGHT URETEROSCOPY STENT REPLACEMENT;  Surgeon: Alexis Frock, MD;  Location: [facility];  Service: Urology;  Laterality: Right;   CYSTOSCOPY/URETEROSCOPY/HOLMIUM LASER/STENT PLACEMENT Right 09/13/2015   Procedure: 1ST STAGE - CYSTOSCOPY/URETEROSCOPY/HOLMIUM LASER/STENT PLACEMENT;  Surgeon: Alexis Frock, MD;  Location: [facility];  Service: Urology;  Laterality: Right;   HOLMIUM LASER APPLICATION Right 79/08/4095   Procedure: HOLMIUM LASER APPLICATION;  Surgeon: Alexis Frock, MD;  Location: [facility];  Service: Urology;  Laterality: Right;   HOLMIUM LASER APPLICATION Right 35/32/9924   Procedure: HOLMIUM LASER;  Surgeon: Alexis Frock, MD;  Location: [facility];  Service: Urology;  Laterality: Right;   HOLMIUM LASER APPLICATION Right 2/68/3419   Procedure: 1ST STAGE HOLMIUM LASER APPLICATION;  Surgeon: Alexis Frock, MD;  Location: [facility];  Service: Urology;  Laterality: Right;   HOLMIUM LASER APPLICATION Right 12/06/2295   Procedure: HOLMIUM LASER APPLICATION;  Surgeon: Alexis Frock, MD;  Location: [facility];  Service: Urology;  Laterality: Right;   TOTAL SHOULDER ARTHROPLASTY Right 08/17/2013   RIGHT TOTAL SHOULDER ARTHROPLASTY;  Surgeon: [name], MD;  Location: [place];  Service: Orthopedics;  Laterality: Right;    Family History  Problem  Relation Age of Onset   Coronary artery disease Father        CABG 85, former smoker   Diabetes Father    Hypertension Father    Hyperlipidemia Mother        trig   Hypertension Sister        HLD   Colon cancer Neg Hx    Esophageal cancer Neg Hx    Rectal cancer Neg Hx    Stomach cancer Neg Hx    Prostate cancer Neg Hx     Medications- reviewed and updated Current Outpatient Medications  Medication Sig Dispense Refill   amLODipine (NORVASC) 5 MG tablet Take 1 tablet (5 mg total) by mouth daily. 90 tablet 3   atorvastatin (LIPITOR) 20 MG tablet TAKE 1 TABLET BY MOUTH EVERY DAY 90 tablet 3   fluticasone (FLONASE) 50 MCG/ACT nasal spray Place 1 spray into both nostrils daily as needed.      nebivolol (BYSTOLIC) 5 MG tablet Take 1 tablet (5 mg total) by mouth daily. 90 tablet 3   Potassium Citrate 15 MEQ (1620 MG) TBCR Take 2 tablets by mouth 2 (two) times daily.     sildenafil (REVATIO) 20 MG tablet Take 20 mg by mouth as needed (ed).     No current facility-administered medications for this visit.    Allergies-reviewed and updated Allergies  Allergen Reactions   Lisinopril Cough    Social History   Social History Narrative   Married. Son and daughter (25 and 19 in 2021).  Grandson in [place] with daughter summer 2021.       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 Oropharynx normal Neck: no thyromegaly CV: RRR no murmurs rubs or gallops Lungs: CTAB no crackles, wheeze, rhonchi Abdomen: soft/nontender/nondistended/normal bowel sounds. No rebound or guarding.  Ext: no edema Skin:  warm, dry Neuro: grossly normal, moves all extremities, PERRLA    Assessment and Plan  [name] [name] presenting for annual physical.  Health Maintenance counseling: 1. Anticipatory guidance: Patient counseled regarding regular dental exams -q6 months, eye exams -yearly,  avoiding smoking and second hand smoke- rare cigar , limiting alcohol to 2 beverages per day , no illicit drugs .   2. Risk factor reduction:  Advised patient of need for regular exercise and diet rich and fruits and vegetables to reduce risk of heart attack and stroke.  Exercise- set backs with cardiology issues and then beta blocker fatigue- better now and encouraged to restart gradually. At least active with job Diet/weight management-weight up 8 lbs in last year- feels could improve his lunches- probably too many cheeseburgers- encouraged to take lunch Wt Readings from Last 3 Encounters:  04/01/22 187 lb 9.6 oz (85.1 kg)  12/15/21 183 lb  6.4 oz (83.2 kg)  07/01/21 184 lb 12.8 oz (83.8 kg)  3. Immunizations/screenings/ancillary studies- prevnar 22- wants to wait until 65 after 65, covid vaccine - recommended updated shot, flu shot today  Immunization History  Administered Date(s) Administered   Influenza Whole 05/26/2010   Influenza, Quadrivalent, Recombinant, Inj, Pf 04/26/2019   Influenza,inj,Quad PF,6+ Mos 04/07/2016, 03/08/2020, 03/31/2021, 04/01/2022   Moderna Sars-Covid-2 Vaccination 10/23/2019, 11/20/2019   PFIZER(Purple Top)SARS-COV-2 Vaccination 06/12/2020   Pneumococcal Polysaccharide-23 08/18/2013   Td 05/26/2010, 04/07/2016   Zoster Recombinat (Shingrix) 04/26/2019, 08/29/2019  4. Prostate cancer screening-  low risk prior psa- trend with labs with Dr. Tresa Moore - declines today Lab Results  Component Value Date   PSA 0.14 11/19/2020   PSA 0.13 10/12/2019   PSA 0.18 08/10/2017   5. Colon cancer screening -  due 05/27/2023 for 8-year repeat per Dr. Ardis Hughs letter- last 2016 6. Skin cancer screening-sees  annually. advised regular sunscreen use. Denies worrisome, changing, or new skin lesions.  7. Smoking associated screening (lung cancer screening, AAA screen 65-75, UA)- quit at end of April 2022!!!!!. Enrolled in lung cancer screening program. They did note emphysema (stable and asymptomatic) and coronary arthrosclerosis. Will get UA with urology next visit Dr. Tresa Moore 8. STD screening -monogamous   Status of chronic or acute concerns   #history of Syncope- has seen cardiology due to PVCs and v tach followed by Dr. Caryl Comes and on beta blockers (less fatigue on nebivolol 5 mg daily and has continued) -Raynauds noted- some consideration of changing to verapamil if needed -cardiomyopathy- followed by Dr. Percival Spanish- most recent cardiac MRI improved on 11/10/21 "Mild LV dysfunction and normal RV function.   Compared to prior study: LVEF has improved, LV size is now normal, decrease in ECV, LGE." -overall stable- continue meds and cardiology follow up    # Emphysema -incidental finding on chest CT August 2020-largely asymptomatic but long-term smoker (has quit thankfully) - has quit cigarettes- very rare cigar with golf maybe once a month and fishing trip- encouraged ideally none  #hypertension S: medication: Amlodipine 5 mg (mild with long days), nebivlolol  5 mg Home readings #s: gets lower than our in office readings BP Readings from Last 3 Encounters:  04/01/22 138/70  12/15/21 140/86  07/01/21 (!) 160/90  A/P: Controlled. Continue current medications.   #hyperlipidemia S: Medication:Atorvastatin 20 mg -Incidental finding on chest ct- 2. Coronary artery atherosclerosis. Lab Results  Component Value Date   CHOL 174 03/31/2021   HDL 61.70 03/31/2021   LDLCALC 73 03/31/2021   LDLDIRECT 85.0 11/30/2017   TRIG 197.0 (H) 03/31/2021   CHOLHDL 3 03/31/2021   A/P: update lipids- hoping for mild improvement but unlikely to change dose if stays so close- continue current meds for now   #  Hyperglycemia/insulin resistance/prediabetes- cbg 106 fasting in past S:  Medication: none   A/P: check a1c for first time due to prior sugars  #Gout S: 1 flares in last month (perhaps 2-3 weeks) In left big toe- took some ibuprofen and settled down.  Did have higher seafood around this time No results found for: "LABURIC" A/P:since had flare- we will check uric acid- consider controlling uric acid if above 6   #fatty liver on ct chest- incidental finding- check LFTs with labs yearly   and also focus on mild weight loss and alcohol reduction  #nephrolithiasis- follows with Dr. Tresa Moore and on potassium citrate as of 2023   Recommended follow up: Return in about 1 year (around 04/02/2023) for  physical or sooner if needed.Schedule b4 you leave. Future Appointments  Date Time Provider [place]  05/19/2022 10:00 AM Debbe Mounts, PhD [facility]   Lab/Order associations: fasting   ICD-10-CM   1. Preventative health care  Z00.00 Hemoglobin A1c    Uric acid    CBC with Differential/Platelet    Comprehensive metabolic panel    Lipid panel    2. Hyperglycemia  R73.9 Hemoglobin A1c    3. Idiopathic chronic gout of multiple sites without tophus  M1A.66Z9 Uric acid    4. Hyperlipidemia, unspecified hyperlipidemia type  E78.5 CBC with Differential/Platelet    Comprehensive metabolic panel    Lipid panel    5. Tobacco abuse  Z72.0     6. Pulmonary emphysema, unspecified emphysema type ([place])  J43.9     7. Screening for prostate cancer  Z12.5     8. Need for immunization against influenza  Z23 Flu Vaccine QUAD 41moIM (Fluarix, Fluzone & Alfiuria Quad PF)      No orders of the defined types were placed in this encounter.   Return precautions advised.  SGarret Reddish MD

## 2022-04-01 NOTE — Patient Instructions (Addendum)
Let us know when you get your COVID shots. We opted for pneumonia shot at 75  Please stop by lab before you go If you have mychart- we will send your results within 3 business days of [place] receiving them.  If you do not have mychart- we will call you about results within 5 business days of [place] receiving them.  *please also note that you will see labs on mychart as soon as they post. I will later go in and write notes on them- will say "notes from Dr. Yong Channel"   Recommended follow up: Return in about 1 year (around 04/02/2023) for physical or sooner if needed.Schedule b4 you leave.

## 2022-05-19 ENCOUNTER — Encounter: Payer: No Typology Code available for payment source | Admitting: Genetic Counselor

## 2022-07-06 HISTORY — PX: LITHOTRIPSY: SUR834

## 2022-07-09 ENCOUNTER — Telehealth: Payer: Self-pay | Admitting: Internal Medicine

## 2022-07-09 NOTE — Telephone Encounter (Signed)
Pt's wife would like a callback from nurse regarding pt's upcoming appt on 1/9. She states that she has questions. Please advise

## 2022-07-13 NOTE — Telephone Encounter (Signed)
Spoke with pt's wife who requests pt's appointment with Dr Broadus John be canceled due to cost and insurance.  Appointment canceled as requested.  Pt will schedule follow up with Dr Caryl Comes.

## 2022-07-13 NOTE — Telephone Encounter (Signed)
Patient wife is calling to speak to Dr. Olin Pia nurse.

## 2022-07-14 ENCOUNTER — Encounter: Payer: No Typology Code available for payment source | Admitting: Genetic Counselor

## 2022-07-29 ENCOUNTER — Other Ambulatory Visit: Payer: Self-pay | Admitting: Urology

## 2022-08-11 ENCOUNTER — Ambulatory Visit: Payer: No Typology Code available for payment source | Attending: Internal Medicine | Admitting: Internal Medicine

## 2022-08-11 ENCOUNTER — Encounter: Payer: Self-pay | Admitting: Internal Medicine

## 2022-08-11 DIAGNOSIS — I493 Ventricular premature depolarization: Secondary | ICD-10-CM

## 2022-08-11 DIAGNOSIS — I429 Cardiomyopathy, unspecified: Secondary | ICD-10-CM

## 2022-08-11 DIAGNOSIS — I4729 Other ventricular tachycardia: Secondary | ICD-10-CM

## 2022-08-11 DIAGNOSIS — R55 Syncope and collapse: Secondary | ICD-10-CM

## 2022-08-11 NOTE — Patient Instructions (Signed)
Medication Instructions:  Your physician recommends that you continue on your current medications as directed. Please refer to the Current Medication list given to you today.  *If you need a refill on your cardiac medications before your next appointment, please call your pharmacy*   Lab Work: None ordered.  If you have labs (blood work) drawn today and your tests are completely normal, you will receive your results only by: [place] (if you have MyChart) OR A paper copy in the mail If you have any lab test that is abnormal or we need to change your treatment, we will call you to review the results.   Testing/Procedures: Your physician has requested that you have an exercise tolerance test. For further information please visit [email or web address]. Please also follow instruction sheet, as given.    Follow-Up: At [facility], you and your health needs are our priority.  As part of our continuing mission to provide you with exceptional heart care, we have created designated Provider Care Teams.  These Care Teams include your primary Cardiologist (physician) and Advanced Practice Providers (APPs -  Physician Assistants and Nurse Practitioners) who all work together to provide you with the care you need, when you need it.  We recommend signing up for the patient portal called "MyChart".  Sign up information is provided on this After Visit Summary.  MyChart is used to connect with patients for Virtual Visits (Telemedicine).  Patients are able to view lab/test results, encounter notes, upcoming appointments, etc.  Non-urgent messages can be sent to your provider as well.   To learn more about what you can do with MyChart, go to [email or web address].    Your next appointment:   May 2025

## 2022-08-11 NOTE — Progress Notes (Unsigned)
Patient Care Team: Marin Olp, MD as PCP - General (Family Medicine) Minus Breeding, MD as PCP - Cardiology (Cardiology) Alexis Frock, MD as Consulting Physician (Urology)   HPI  [name] [name] is a 62 y.o. male seen in follow-up for syncope-abrupt in onset and offset  4/22 and found on evaluation to have frequent PVCs and nonsustained VT. 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  Repeat GXT only monomorphic PVCs were noted ECG LBBB inferior axis but upright avL so not out of summit  ECG from GXT >> LBBB infer axis transition V4 with QS avR/L suggestive of RVOT VT, but also with exercise bidirectional PVCs ;    cMRI however is modestly abnormal in a pattern suggestive of myocarditis but there has been interval improvement  He has been seen by Dr. Broadus John.   Genetic testing was reviewed from 6/23.  No pathogenic variants were noted.  Reviewed this with the patient.  The patient denies chest pain, shortness of breath, nocturnal dyspnea, orthopnea or peripheral edema.  There have been no palpitations, lightheadedness or syncope.     He appreciates the Bystolic much more than he does the nadolol.  No fatigue.     DATE TEST EF%    6/22 Echo   50-55%     10/22 cMRI 44%  Global HK LGE-diffuse w/o pattern for myocarditis or ARVC  5/23 cMRI 49% LGE     Date Cr K Hgb  09/21 1.17 3.9 14.8  07/22 0.90 4.1 16.7  4/23   14.9    Records and Results Reviewed (As above)   Past Medical History:  Diagnosis Date   Arthritis    ED (erectile dysfunction)    History of adenomatous polyp of colon    05-27-2015   History of gout    no issue since 2012   History of syncope 10/2020   pcp referred pt to cardiology, seen by dr hochrein, note 12-26-2020 in epic;  work-up includes event monitor 12-03-2020 showed 33% PVCs w/ occ nonsustained VT runs,  echo 12-04-2020 ef  55-55% freq PVCs GiDD,  ETT 12-27-2020 showed negative for ischemia   Hyperlipidemia    Hypertension    Raynaud's syndrome    Renal calculus, right    Testicular hypogonadism    Wears glasses     Past Surgical History:  Procedure Laterality Date   BICEPS TENDON REPAIR Right 2003   COLONOSCOPY  05/27/2015   CYSTOSCOPY WITH RETROGRADE PYELOGRAM, URETEROSCOPY AND STENT PLACEMENT Right 01/01/2021   Procedure: CYSTOSCOPY WITH RETROGRADE PYELOGRAM, URETEROSCOPY AND STENT PLACEMENT;  Surgeon: Alexis Frock, MD;  Location: [facility];  Service: Urology;  Laterality: Right;  90 MINS   CYSTOSCOPY WITH RETROGRADE PYELOGRAM, URETEROSCOPY AND STENT PLACEMENT Right 01/10/2021   Procedure: CYSTOSCOPY WITH RETROGRADE PYELOGRAM, URETEROSCOPY AND STENT EXCHANGE;  Surgeon: Alexis Frock, MD;  Location: [facility];  Service: Urology;  Laterality: Right;   CYSTOSCOPY WITH URETEROSCOPY, STONE BASKETRY AND STENT PLACEMENT Right 09/27/2015   Procedure: 2ND STAGE CYSTOSCOPY RIGHT URETEROSCOPY STENT REPLACEMENT;  Surgeon: Alexis Frock, MD;  [facility];  Service: Urology;  Laterality: Right;   CYSTOSCOPY/URETEROSCOPY/HOLMIUM LASER/STENT PLACEMENT Right 09/13/2015   Procedure: 1ST STAGE - CYSTOSCOPY/URETEROSCOPY/HOLMIUM LASER/STENT PLACEMENT;  Surgeon: Alexis Frock, MD;  Location: [facility];  Service: Urology;  Laterality: Right;   HOLMIUM LASER APPLICATION Right 66/29/4765  Procedure: HOLMIUM LASER APPLICATION;  Surgeon: Alexis Frock, MD;  Location: [facility];  Service: Urology;  Laterality: Right;   HOLMIUM LASER APPLICATION Right 41/32/4401   Procedure: HOLMIUM LASER;  Surgeon: Alexis Frock, MD;  Location: [facility];  Service: Urology;  Laterality: Right;   HOLMIUM LASER APPLICATION Right 0/27/2536   Procedure: 1ST STAGE HOLMIUM LASER APPLICATION;  Surgeon: Alexis Frock, MD;  Location: [facility];  Service: Urology;  Laterality: Right;   HOLMIUM LASER APPLICATION Right 12/07/4032   Procedure: HOLMIUM LASER APPLICATION;  Surgeon: Alexis Frock, MD;  Location: [facility];  Service: Urology;  Laterality: Right;   TOTAL SHOULDER ARTHROPLASTY Right 08/17/2013   RIGHT TOTAL SHOULDER ARTHROPLASTY;  Surgeon: [name], MD;  Location: [place];  Service: Orthopedics;  Laterality: Right;    Current Meds  Medication Sig   amLODipine (NORVASC) 5 MG tablet Take 1 tablet (5 mg total) by mouth daily.   atorvastatin (LIPITOR) 20 MG tablet TAKE 1 TABLET BY MOUTH EVERY DAY   fluticasone (FLONASE) 50 MCG/ACT nasal spray Place 1 spray into both nostrils daily as needed.    nebivolol (BYSTOLIC) 5 MG tablet Take 1 tablet (5 mg total) by mouth daily.   Potassium Citrate 15 MEQ (1620 MG) TBCR Take 2 tablets by mouth 2 (two) times daily.   sildenafil (REVATIO) 20 MG tablet Take 20 mg by mouth as needed (ed).    Allergies  Allergen Reactions   Lisinopril Cough      Review of Systems negative except from HPI and PMH  Physical Exam BP (!) 146/82   Pulse 76   Ht '5\' 9"'$  (1.753 m)   Wt 187 lb 12.8 oz (85.2 kg)   SpO2 95%   BMI 27.73 kg/m  Well developed and nourished in no acute distress HENT normal Neck supple with JVP-  flat    Clear Regular rate and rhythm, no murmurs or gallops Abd-soft with active BS No Clubbing cyanosis edema Skin-warm and dry A & Oriented  Grossly normal sensory and motor function  ECG sinus at 76 Intervals 18/11/39 Is otherwise normal  CrCl cannot be calculated (Patient's most recent lab result is older than the maximum 21 days allowed.).   Assessment and  Plan Syncope abrupt onset and offset   PVCs-dimorphic morphology right bundle inferior axis with upright QRS lead aVL versus negative QRS in lead aVL  cMRI abnormal    Exercise-induced biventricular, alternating axis PVCs   Cardiomyopathy-mild with interval  improvement  Raynaud's   Hypertension  Fatigue  Mild cardiomyopathy.  For blood pressure, would prefer to use spironolactone; however, right now he is in the midst of kidney stone surgery and recommendations to use potassium citrate to help dissolve stones.  We will defer the change of his medication to his treadmill (see below) for which time he will have a chance to talk with his urologist as to whether there are other citrate salts that he could use that do not have adjunctive potassium  Presumptive diagnosis of CPVT with the caveats of mild cardiomyopathy as noted.  Having switched him from nadolol to nebivolol, we will undertake treadmill testing in May to look for effectiveness.     Interestingly, I did a quick literature search, and there are reports of patients meeting criteria for CPVT who have structural abnormalities as suggested by cMRI including noncompaction.  Reversible cardiomyopathies did not show up at least on the brief search but it does highlight the  possibility that the phenotype of CPVT can be associated with structural issues.  He met with Dr. Broadus John regarding genetic testing, genetic testing was negative.  I reviewed this with him.

## 2022-08-13 ENCOUNTER — Encounter (HOSPITAL_BASED_OUTPATIENT_CLINIC_OR_DEPARTMENT_OTHER): Payer: Self-pay | Admitting: Urology

## 2022-08-14 ENCOUNTER — Encounter (HOSPITAL_BASED_OUTPATIENT_CLINIC_OR_DEPARTMENT_OTHER): Payer: Self-pay | Admitting: Urology

## 2022-08-14 NOTE — Progress Notes (Signed)
Spoke w/ via phone for pre-op interview--- pt Lab needs dos----  [employer] results------ current EKG in epic/ chart COVID test -----patient states asymptomatic no test needed Arrive at ------- 1130 on 08-21-2022 NPO after MN NO Solid Food.  Clear liquids from MN until--- 1030 Med rec completed Medications to take morning of surgery ----- lipitor, norvasc, bystolic Diabetic medication ----- n/a Patient instructed no nail polish to be worn day of surgery Patient instructed to bring photo id and insurance card day of surgery Patient aware to have Driver (ride ) / caregiver    for 24 hours after surgery -- wife, [name] [name] Patient Special Instructions ----- n/a Pre-Op special Istructions ----- n/a Patient verbalized understanding of instructions that were given at this phone interview. Patient denies shortness of breath, chest pain, fever, cough at this phone interview.   Anesthesia Review:  HTN;  freq PVCs/ dx CPVT  (pt stated asymptomatic).   Pt denies any near syncopy/ syncopy since 04/ 2022 and no sob.  PCP:  Dr Chauncey Cruel. Yong Channel [facility] 04-01-2022) Cardiologist :  Dr Caryl Comes [facility] 08-11-2022) Chest x-ray : CT 12-17-2021 EKG :  08-11-2022 Echo :  12-04-2020 Stress test: ETT 04-29-2021 Cardiac Cath : no Activity level: denies sob w/ any activities Sleep Study/ CPAP :  no Blood Thinner/ Instructions /Last Dose: no ASA / Instructions/ Last Dose : no

## 2022-08-21 ENCOUNTER — Ambulatory Visit (HOSPITAL_BASED_OUTPATIENT_CLINIC_OR_DEPARTMENT_OTHER): Payer: No Typology Code available for payment source | Admitting: Anesthesiology

## 2022-08-21 ENCOUNTER — Encounter (HOSPITAL_BASED_OUTPATIENT_CLINIC_OR_DEPARTMENT_OTHER)
Admission: RE | Disposition: A | Discharge status: Home / Self Care | Payer: Self-pay | Source: Ambulatory Visit | Attending: Urology

## 2022-08-21 ENCOUNTER — Ambulatory Visit (HOSPITAL_BASED_OUTPATIENT_CLINIC_OR_DEPARTMENT_OTHER)
Admission: RE | Admit: 2022-08-21 | Discharge: 2022-08-21 | Disposition: A | Discharge status: Home / Self Care | Payer: No Typology Code available for payment source | Source: Ambulatory Visit | Attending: Urology | Admitting: Urology

## 2022-08-21 ENCOUNTER — Other Ambulatory Visit: Payer: Self-pay

## 2022-08-21 ENCOUNTER — Encounter (HOSPITAL_BASED_OUTPATIENT_CLINIC_OR_DEPARTMENT_OTHER): Payer: Self-pay | Admitting: Urology

## 2022-08-21 DIAGNOSIS — Z01818 Encounter for other preprocedural examination: Secondary | ICD-10-CM

## 2022-08-21 DIAGNOSIS — J449 Chronic obstructive pulmonary disease, unspecified: Secondary | ICD-10-CM | POA: Insufficient documentation

## 2022-08-21 DIAGNOSIS — N200 Calculus of kidney: Secondary | ICD-10-CM

## 2022-08-21 DIAGNOSIS — M1990 Unspecified osteoarthritis, unspecified site: Secondary | ICD-10-CM | POA: Insufficient documentation

## 2022-08-21 DIAGNOSIS — I10 Essential (primary) hypertension: Secondary | ICD-10-CM | POA: Insufficient documentation

## 2022-08-21 DIAGNOSIS — F17210 Nicotine dependence, cigarettes, uncomplicated: Secondary | ICD-10-CM | POA: Insufficient documentation

## 2022-08-21 HISTORY — DX: Other ventricular tachycardia: I47.29

## 2022-08-21 HISTORY — DX: Idiopathic chronic gout, multiple sites, without tophus (tophi): M1A.09X0

## 2022-08-21 HISTORY — PX: HOLMIUM LASER APPLICATION: SHX5852

## 2022-08-21 HISTORY — PX: CYSTOSCOPY WITH RETROGRADE PYELOGRAM, URETEROSCOPY AND STENT PLACEMENT: SHX5789

## 2022-08-21 HISTORY — DX: Cardiomyopathy, unspecified: I42.9

## 2022-08-21 HISTORY — DX: Left bundle-branch block, unspecified: I44.7

## 2022-08-21 HISTORY — DX: Ventricular premature depolarization: I49.3

## 2022-08-21 HISTORY — DX: Emphysema, unspecified: J43.9

## 2022-08-21 HISTORY — DX: Prediabetes: R73.03

## 2022-08-21 LAB — POCT I-STAT, CHEM 8
BUN: 20 mg/dL (ref 6–20)
Calcium, Ion: 1.07 mmol/L — ABNORMAL LOW (ref 1.15–1.40)
Chloride: 106 mmol/L (ref 98–111)
Creatinine, Ser: 1 mg/dL (ref 0.61–1.24)
Glucose, Bld: 116 mg/dL — ABNORMAL HIGH (ref 70–99)
HCT: 47 % (ref 39.0–52.0)
Hemoglobin: 16 g/dL (ref 13.0–17.0)
Potassium: 4.3 mmol/L (ref 3.5–5.1)
Sodium: 138 mmol/L (ref 135–145)
TCO2: 23 mmol/L (ref 22–32)

## 2022-08-21 SURGERY — CYSTOURETEROSCOPY, WITH RETROGRADE PYELOGRAM AND STENT INSERTION
Anesthesia: General | Laterality: Right

## 2022-08-21 MED ORDER — ACETAMINOPHEN 500 MG PO TABS
1000.0000 mg | ORAL_TABLET | Freq: Once | ORAL | Status: AC
  Administered 2022-08-21: 1000 mg via ORAL

## 2022-08-21 MED ORDER — OXYCODONE HCL 5 MG/5ML PO SOLN: 5.0000 mg | Freq: Once | ORAL | Status: DC | PRN

## 2022-08-21 MED ORDER — PROPOFOL 10 MG/ML IV BOLUS
INTRAVENOUS | Status: DC | PRN
  Administered 2022-08-21: 180 mg via INTRAVENOUS

## 2022-08-21 MED ORDER — SENNOSIDES-DOCUSATE SODIUM 8.6-50 MG PO TABS: 1.0000 | ORAL_TABLET | Freq: Two times a day (BID) | ORAL | 0 refills | Status: DC

## 2022-08-21 MED ORDER — FENTANYL CITRATE (PF) 100 MCG/2ML IJ SOLN: 25.0000 ug | INTRAMUSCULAR | Status: DC | PRN

## 2022-08-21 MED ORDER — SULFAMETHOXAZOLE-TRIMETHOPRIM 800-160 MG PO TABS: 1.0000 | ORAL_TABLET | Freq: Every day | ORAL | 0 refills | Status: DC

## 2022-08-21 MED ORDER — PHENYLEPHRINE HCL (PRESSORS) 10 MG/ML IV SOLN
INTRAVENOUS | Status: DC | PRN
  Administered 2022-08-21: 80 ug via INTRAVENOUS

## 2022-08-21 MED ORDER — CEFAZOLIN SODIUM-DEXTROSE 2-3 GM-%(50ML) IV SOLR
INTRAVENOUS | Status: DC | PRN
  Administered 2022-08-21: 2 g via INTRAVENOUS

## 2022-08-21 MED ORDER — MIDAZOLAM HCL 2 MG/2ML IJ SOLN
INTRAMUSCULAR | Status: DC | PRN
  Administered 2022-08-21: 2 mg via INTRAVENOUS

## 2022-08-21 MED ORDER — MIDAZOLAM HCL 2 MG/2ML IJ SOLN
INTRAMUSCULAR | Status: AC
  Filled 2022-08-21: qty 2

## 2022-08-21 MED ORDER — OXYCODONE HCL 5 MG PO TABS: 5.0000 mg | ORAL_TABLET | Freq: Once | ORAL | Status: DC | PRN

## 2022-08-21 MED ORDER — FENTANYL CITRATE (PF) 100 MCG/2ML IJ SOLN
INTRAMUSCULAR | Status: DC | PRN
  Administered 2022-08-21 (×2): 50 ug via INTRAVENOUS

## 2022-08-21 MED ORDER — ACETAMINOPHEN 500 MG PO TABS
ORAL_TABLET | ORAL | Status: AC
  Filled 2022-08-21: qty 2

## 2022-08-21 MED ORDER — LACTATED RINGERS IV SOLN
INTRAVENOUS | Status: DC
  Administered 2022-08-21: 1000 mL via INTRAVENOUS

## 2022-08-21 MED ORDER — ONDANSETRON HCL 4 MG/2ML IJ SOLN
INTRAMUSCULAR | Status: DC | PRN
  Administered 2022-08-21: 4 mg via INTRAVENOUS

## 2022-08-21 MED ORDER — KETOROLAC TROMETHAMINE 10 MG PO TABS: 10.0000 mg | ORAL_TABLET | Freq: Three times a day (TID) | ORAL | 0 refills | Status: DC | PRN

## 2022-08-21 MED ORDER — AMISULPRIDE (ANTIEMETIC) 5 MG/2ML IV SOLN: 10.0000 mg | Freq: Once | INTRAVENOUS | Status: DC | PRN

## 2022-08-21 MED ORDER — OXYCODONE HCL 5 MG PO TABS: 5.0000 mg | ORAL_TABLET | Freq: Four times a day (QID) | ORAL | 0 refills | Status: DC | PRN

## 2022-08-21 MED ORDER — DEXAMETHASONE SODIUM PHOSPHATE 4 MG/ML IJ SOLN
INTRAMUSCULAR | Status: DC | PRN
  Administered 2022-08-21: 8 mg via INTRAVENOUS

## 2022-08-21 MED ORDER — FENTANYL CITRATE (PF) 100 MCG/2ML IJ SOLN
INTRAMUSCULAR | Status: AC
  Filled 2022-08-21: qty 2

## 2022-08-21 MED ORDER — LIDOCAINE HCL (CARDIAC) PF 100 MG/5ML IV SOSY
PREFILLED_SYRINGE | INTRAVENOUS | Status: DC | PRN
  Administered 2022-08-21: 100 mg via INTRAVENOUS

## 2022-08-21 SURGICAL SUPPLY — 27 items
BAG DRAIN URO-CYSTO SKYTR STRL (DRAIN) ×1 IMPLANT
BAG DRN UROCATH (DRAIN) ×1
BASKET LASER NITINOL 1.9FR (BASKET) IMPLANT
BSKT STON RTRVL 120 1.9FR (BASKET) ×1
CATH URETL OPEN END 6FR 70 (CATHETERS) IMPLANT
CLOTH BEACON ORANGE TIMEOUT ST (SAFETY) ×1 IMPLANT
FIBER LASER FLEXIVA 365 (UROLOGICAL SUPPLIES) IMPLANT
GLOVE BIO SURGEON STRL SZ7.5 (GLOVE) ×1 IMPLANT
GOWN STRL REUS W/TWL LRG LVL3 (GOWN DISPOSABLE) ×1 IMPLANT
GUIDEWIRE ANG ZIPWIRE 038X150 (WIRE) ×1 IMPLANT
GUIDEWIRE STR DUAL SENSOR (WIRE) ×1 IMPLANT
IV NS 1000ML (IV SOLUTION) ×1
IV NS 1000ML BAXH (IV SOLUTION) ×1 IMPLANT
IV NS IRRIG 3000ML ARTHROMATIC (IV SOLUTION) ×1 IMPLANT
KIT TURNOVER CYSTO (KITS) ×1 IMPLANT
MANIFOLD NEPTUNE II (INSTRUMENTS) ×1 IMPLANT
NS IRRIG 500ML POUR BTL (IV SOLUTION) ×1 IMPLANT
PACK CYSTO (CUSTOM PROCEDURE TRAY) ×1 IMPLANT
SHEATH NAVIGATOR HD 12/14X36 (SHEATH) IMPLANT
SLEEVE SCD COMPRESS KNEE MED (STOCKING) ×1 IMPLANT
STENT POLARIS 5FRX26 (STENTS) IMPLANT
SYR 10ML LL (SYRINGE) ×1 IMPLANT
TRACTIP FLEXIVA PULS ID 200XHI (Laser) IMPLANT
TRACTIP FLEXIVA PULSE ID 200 (Laser) ×1
TUBE CONNECTING 12X1/4 (SUCTIONS) ×1 IMPLANT
TUBE FEEDING 8FR 16IN STR KANG (MISCELLANEOUS) IMPLANT
TUBING UROLOGY SET (TUBING) ×1 IMPLANT

## 2022-08-21 NOTE — Discharge Instructions (Addendum)
1 - You may have urinary urgency (bladder spasms) and bloody urine on / off with stent in place. This is normal.  2 - Remove tethered stent on Monday morning at home by pulling string, then blue-white plastic tubing, and discarding. Office is open Monday if any issues arise.   3 - Call MD or go to ER for fever >102, severe pain / nausea / vomiting not relieved by medications, or acute change in medical status  Post Anesthesia Home Care Instructions  Activity: Get plenty of rest for the remainder of the day. A responsible adult should stay with you for 24 hours following the procedure.  For the next 24 hours, DO NOT: -Drive a car -Paediatric nurse -Drink alcoholic beverages -Take any medication unless instructed by your physician -Make any legal decisions or sign important papers.  Meals: Start with liquid foods such as gelatin or soup. Progress to regular foods as tolerated. Avoid greasy, spicy, heavy foods. If nausea and/or vomiting occur, drink only clear liquids until the nausea and/or vomiting subsides. Call your physician if vomiting continues.  Special Instructions/Symptoms: Your throat may feel dry or sore from the anesthesia or the breathing tube placed in your throat during surgery. If this causes discomfort, gargle with warm salt water. The discomfort should disappear within 24 hours.

## 2022-08-21 NOTE — Anesthesia Postprocedure Evaluation (Signed)
Anesthesia Post Note  Patient: [name] [name]  Procedure(s) Performed: CYSTOSCOPY WITH RETROGRADE PYELOGRAM, URETEROSCOPY AND STENT PLACEMENT stone basket extracrion (Right) HOLMIUM LASER APPLICATION (Right)     Patient location during evaluation: PACU Anesthesia Type: General Level of consciousness: awake and alert Pain management: pain level controlled Vital Signs Assessment: post-procedure vital signs reviewed and stable Respiratory status: spontaneous breathing, nonlabored ventilation and respiratory function stable Cardiovascular status: blood pressure returned to baseline Postop Assessment: no apparent nausea or vomiting Anesthetic complications: no   No notable events documented.  Last Vitals:  Vitals:   08/21/22 1459 08/21/22 1528  BP:    Pulse: 62 (!) 54  Resp: 13 20  Temp: 36.4 C   SpO2: 97% 97%    Last Pain:  Vitals:   08/21/22 1159  TempSrc: Oral  PainSc: 0-No pain                 Marthenia Rolling

## 2022-08-21 NOTE — Transfer of Care (Signed)
Immediate Anesthesia Transfer of Care Note  Patient: Ormal Anastas  Procedure(s) Performed: CYSTOSCOPY WITH RETROGRADE PYELOGRAM, URETEROSCOPY AND STENT PLACEMENT stone basket extracrion (Right) HOLMIUM LASER APPLICATION (Right)  Patient Location: PACU  Anesthesia Type:General  Level of Consciousness: awake and patient cooperative  Airway & Oxygen Therapy: Patient Spontanous Breathing and Patient connected to nasal cannula oxygen  Post-op Assessment: Report given to RN and Post -op Vital signs reviewed and stable  Post vital signs: Reviewed and stable  Last Vitals:  Vitals Value Taken Time  BP 153/89 08/21/22 1428  Temp 36.5 C 08/21/22 1428  Pulse 61 08/21/22 1428  Resp 13 08/21/22 1428  SpO2 97 % 08/21/22 1428    Last Pain:  Vitals:   08/21/22 1159  TempSrc: Oral  PainSc: 0-No pain      Patients Stated Pain Goal: 6 ([phone] [id number])  Complications: No notable events documented.

## 2022-08-21 NOTE — Op Note (Unsigned)
NAMEJAKSEN, AUDE MEDICAL RECORD NO: [id number] ACCOUNT NO: [id number] DATE OF BIRTH: 1961/03/22 FACILITY: [facility] LOCATION: [facility] PHYSICIAN: Alexis Frock, MD  Operative Report   DATE OF PROCEDURE: 08/21/2022  PREOPERATIVE DIAGNOSIS:  Recurrent right renal stones.  PROCEDURES:   1.  Cystoscopy, right retrograde pyelogram interpretation. 2.  Right ureteroscopy with laser lithotripsy. 3.  Insertion of right ureteral stent.  ESTIMATED BLOOD LOSS:  Nil.  COMPLICATIONS:  None.  SPECIMEN:  Right renal stone fragments for composition analysis.  FINDINGS:   1.  Multifocal right mid and lower pole renal stone.  Total volume approximately 1 cm2. 2.  Complete resolution of all accessible stone fragments larger than one-third mm following laser lithotripsy and basket extraction. 3.  Successful placement of right ureteral stent, proximal end in the renal pelvis, distal end in urinary bladder, with tether.  INDICATIONS:  The patient is a pleasant 62 year old man with history of recurrent urolithiasis.  He is very compliant with medical therapy and surveillance. Despite this, he was found to have significant stone recurrence on most recent surveillance  imaging, approximately 1 cm total in his mid and lower pole.  He has undergone more aggressive stone surgery previously, wishes to avoid this understandably. We both agree on preemptive ureteroscopy given the stone growth to prevent further growth to the  point of needing percutaneous approach surgery or leading to renal demise. As such, he presents today for right ureteroscopy with goal of stone free.  Informed consent was obtained and placed in medical record.  PROCEDURE DETAILS:  The patient being identified and verified. The procedure being right ureteroscopic stone manipulation was confirmed.  Procedure timeout was performed.  Intravenous antibiotics were administered.  General anesthesia was induced.  The  patient was placed into a  low lithotomy position.  Sterile field was created, prepped and draped the patient's penis, perineum, and proximal thighs using iodine.  Cystourethroscopy was performed using 21-French rigid cystoscope with offset lens.   Inspection of anterior and posterior urethra was unremarkable.  Inspection of urinary bladder revealed no diverticula, calcifications or papillary lesions.  Right ureteral orifice was cannulated with 6-French open-ended catheter, and right retrograde  pyelogram was obtained.  Right retrograde pyelogram demonstrated single right ureter, single system right kidney.  There were several filling defects mobile in the lower pole consistent with known stone.  0.038 ZIPwire was advanced to the upper poole acting as a safety wire.  A 8-French feeding tube placed in the urinary  bladder for pressure release.  Semirigid ureteroscopy performed to distal orifice of the right ureter alongside a separate sensor working wire.  No mucosal abnormalities found.  Semirigid scope was exchanged for a medium length ureteral access sheath to  the level of the proximal ureter using continuous fluoroscopic guidance and flexible digital ureteroscopy was performed of the proximal right ureter and systematic inspection of the right kidney.  There was multifocal right intrarenal stones noted that  were obstructing at this point.  Dominant stone in lower pole approximately 8 mm.  There were several other smaller stones 2 to 3 mm that were free floating. Escape basket was used to grasp the smaller stones, they were removed sequentially and set aside  for composition analysis.  The larger stone was grasped and repositioned into an upper pole calix to allow for less acute angulation and holmium laser energy applied to the stone using a setting of 0.2 joules and 20 Hz and approximately 80% of stone  volume was dusted and 20% fragmented with  the fragments being amenable to simple basketing.  Following this, complete  resolution of all accessible stone fragments larger than one-third mm. No evidence of perforation.  Excellent hemostasis. Access sheath  was removed under continuous vision. No significant mucosal abnormalities found.  Given the access sheath usage, it was felt that brief interval stenting with tethered stent would be most prudent.  As such, a new 5 x 26 Polaris type stent was placed  using fluoroscopic guidance.  Proximal coils were noted.  Tether was left in place, trimmed to length, taped to the dorsum of penis.  The procedure was terminated.  The patient tolerated the procedure well, no immediate periprocedural complications.  The  patient taken to postanesthesia care in stable condition.  Plan for discharge home.   VAI D: 08/21/2022 2:29:32 pm T: 08/21/2022 7:37:00 pm  JOB: [id number]

## 2022-08-21 NOTE — Anesthesia Preprocedure Evaluation (Addendum)
Anesthesia Evaluation  Patient identified by MRN, date of birth, ID band Patient awake    Reviewed: Allergy & Precautions, NPO status , Patient's Chart, lab work & pertinent test results, reviewed documented beta blocker date and time   History of Anesthesia Complications Negative for: history of anesthetic complications  Airway Mallampati: II  TM Distance: >3 FB Neck ROM: Full    Dental no notable dental hx.    Pulmonary COPD, Current Smoker and Patient abstained from smoking.   Pulmonary exam normal        Cardiovascular hypertension, Pt. on medications and Pt. on home beta blockers Normal cardiovascular exam  TTE 2022: EF 50-55%, grade I DD, mild MR   Hx of catecholaminergic polymorphic ventricular tachycardia   Neuro/Psych negative neurological ROS     GI/Hepatic negative GI ROS, Neg liver ROS,,,  Endo/Other  negative endocrine ROS    Renal/GU Renal diseaseRECURRENT RENAL STONES  negative genitourinary   Musculoskeletal  (+) Arthritis ,    Abdominal   Peds  Hematology negative hematology ROS (+)   Anesthesia Other Findings Day of surgery medications reviewed with patient.  Reproductive/Obstetrics negative OB ROS                              Anesthesia Physical Anesthesia Plan  ASA: 2  Anesthesia Plan: General   Post-op Pain Management: Tylenol PO (pre-op)*   Induction: Intravenous  PONV Risk Score and Plan: 2 and Treatment may vary due to age or medical condition, Ondansetron, Dexamethasone and Midazolam  Airway Management Planned: LMA  Additional Equipment: None  Intra-op Plan:   Post-operative Plan: Extubation in OR  Informed Consent: I have reviewed the patients History and Physical, chart, labs and discussed the procedure including the risks, benefits and alternatives for the proposed anesthesia with the patient or authorized representative who has indicated  his/her understanding and acceptance.     Dental advisory given  Plan Discussed with: CRNA  Anesthesia Plan Comments:         Anesthesia Quick Evaluation

## 2022-08-21 NOTE — Anesthesia Procedure Notes (Signed)
Procedure Name: LMA Insertion Date/Time: 08/21/2022 1:32 PM  Performed by: Georgeanne Nim, CRNAPre-anesthesia Checklist: Patient identified, Emergency Drugs available, Suction available, Patient being monitored and Timeout performed Patient Re-evaluated:Patient Re-evaluated prior to induction Oxygen Delivery Method: Circle system utilized Preoxygenation: Pre-oxygenation with 100% oxygen Induction Type: IV induction Ventilation: Mask ventilation without difficulty LMA: LMA inserted LMA Size: 4.0 Number of attempts: 1 Placement Confirmation: positive ETCO2 and breath sounds checked- equal and bilateral Tube secured with: Tape Dental Injury: Teeth and Oropharynx as per pre-operative assessment

## 2022-08-21 NOTE — Brief Op Note (Signed)
08/21/2022  2:24 PM  PATIENT:  [name] [name]  62 y.o. male  PRE-OPERATIVE DIAGNOSIS:  RECURRENT RENAL STONES  POST-OPERATIVE DIAGNOSIS:  RECURRENT RENAL STONES  PROCEDURE:  Procedure(s): CYSTOSCOPY WITH RETROGRADE PYELOGRAM, URETEROSCOPY AND STENT PLACEMENT stone basket extracrion (Right) HOLMIUM LASER APPLICATION (Right)  SURGEON:  Surgeon(s) and Role:    Alexis Frock, MD - Primary  PHYSICIAN ASSISTANT:   ASSISTANTS: none   ANESTHESIA:   general  EBL:  0 mL   BLOOD ADMINISTERED:none  DRAINS: none   LOCAL MEDICATIONS USED:  NONE  SPECIMEN:  Source of Specimen:  Rt renal stones / fragments  DISPOSITION OF SPECIMEN:   [facility] for compositional analysis  COUNTS:  YES  TOURNIQUET:  * No tourniquets in log *  DICTATION: .Other Dictation: Dictation Number [id number]  PLAN OF CARE: Discharge to home after PACU  PATIENT DISPOSITION:  PACU - hemodynamically stable.   Delay start of Pharmacological VTE agent (>24hrs) due to surgical blood loss or risk of bleeding: yes

## 2022-08-21 NOTE — H&P (Signed)
[name] [name] is an 62 y.o. male.    Chief Complaint: Pre-Op Ureteroscopic Stone Manipulation  HPI:   1- Recurrent Nephrolithiasis -  2017 - Rt staged URS for 2cm stone  11/2020 - Rt staged URS to stone free for abtou 3cm renal stone   Recent Surveillance:  05/2022 - [facility], Renal US - Rt non-obsturcting stones x several, about 1.5cm total volume.   6 - Medical Stone Disease / Hypocitraturia / Oliguria :  Eval 2022: BMP, PTH, Urate - mild high urate (8); Composition - 100% CaOx; 24 Hr Urines - low volume (1.1L) and very low citrate (88) ==> increase fluid + KCit 30 BID meals.   PMH sig for HLD, Otho surgery, Gout. No strong blood thinners. WOrks full time at [employer]. His PCP is Garret Reddish MD with [facility].    Today " DP " is seen to proceed with RIGHT ureteroscopic stone manipulaiton for recurrent stoen. No intervla fevers. Most recent UCX negative.   Past Medical History:  Diagnosis Date   Arthritis    Cardiomyopathy [facility])    followed by dr dr Caryl Comes---   per lov note 08-11-2022  ef 49%   CPVT (catecholaminergic polymorphic ventricular tachycardia) [facility])    followed by cardiologist--- dr Caryl Comes   ED (erectile dysfunction)    History of adenomatous polyp of colon    05-27-2015   History of syncope 10/2020   pcp referred pt to cardiology, seen by dr hochrein, note 12-26-2020 in epic;  work-up includes event monitor 12-03-2020 showed 33% PVCs w/ occ nonsustained VT runs,  echo 12-04-2020 ef 55-55% freq PVCs GiDD,  ETT 12-27-2020 showed negative for ischemia   Hyperlipidemia    Hypertension    Idiopathic chronic gout of multiple sites without tophus    followed by pcp;   LBBB (left bundle branch block) 02/2021   Pre-diabetes    Pulmonary emphysema ([place])    per pt pcp note 2022;   pt quit cigarettes 2014  but still smokes cigar , average 2 per month   PVC's (premature ventricular contractions)    08-14-2022 per pt asymptomatic   Raynaud's syndrome    Renal  calculus, right    Testicular hypogonadism    Wears glasses     Past Surgical History:  Procedure Laterality Date   BICEPS TENDON REPAIR Right 2003   COLONOSCOPY  05/27/2015   CYSTOSCOPY WITH RETROGRADE PYELOGRAM, URETEROSCOPY AND STENT PLACEMENT Right 01/01/2021   Procedure: CYSTOSCOPY WITH RETROGRADE PYELOGRAM, URETEROSCOPY AND STENT PLACEMENT;  Surgeon: Alexis Frock, MD;  Location: [place];  Service: Urology;  Laterality: Right;  90 MINS   CYSTOSCOPY WITH RETROGRADE PYELOGRAM, URETEROSCOPY AND STENT PLACEMENT Right 01/10/2021   Procedure: CYSTOSCOPY WITH RETROGRADE PYELOGRAM, URETEROSCOPY AND STENT EXCHANGE;  Surgeon: Alexis Frock, MD;  Location[facility];  Service: Urology;  Laterality: Right;   CYSTOSCOPY WITH URETEROSCOPY, STONE BASKETRY AND STENT PLACEMENT Right 09/27/2015   Procedure: 2ND STAGE CYSTOSCOPY RIGHT URETEROSCOPY STENT REPLACEMENT;  Surgeon: Alexis Frock, MD;  Location: [facility];  Service: Urology;  Laterality: Right;   CYSTOSCOPY/URETEROSCOPY/HOLMIUM LASER/STENT PLACEMENT Right 09/13/2015   Procedure: 1ST STAGE - CYSTOSCOPY/URETEROSCOPY/HOLMIUM LASER/STENT PLACEMENT;  Surgeon: Alexis Frock, MD;  Location: [facility];  Service: Urology;  Laterality: Right;   HOLMIUM LASER APPLICATION Right [id number]   Procedure: HOLMIUM LASER APPLICATION;  Surgeon: Alexis Frock, MD;  Location: [facility];  Service: Urology;  Laterality: Right;   HOLMIUM LASER APPLICATION Right [id number]  Procedure: HOLMIUM LASER;  Surgeon: Alexis Frock, MD;  Location: [facility];  Service: Urology;  Laterality: Right;   HOLMIUM LASER APPLICATION Right [id number]   Procedure: 1ST STAGE HOLMIUM LASER APPLICATION;  Surgeon: Alexis Frock, MD;  Location: [facility];  Service: Urology;  Laterality: Right;   HOLMIUM LASER APPLICATION Right [id number]   Procedure: HOLMIUM LASER  APPLICATION;  Surgeon: Alexis Frock, MD;  Location: [facility];  Service: Urology;  Laterality: Right;   TOTAL SHOULDER ARTHROPLASTY Right 08/17/2013   RIGHT TOTAL SHOULDER ARTHROPLASTY;  Surgeon: [name], MD;  Location: [place];  Service: Orthopedics;  Laterality: Right;    Family History  Problem Relation Age of Onset   Coronary artery disease Father        CABG 74, former smoker   Diabetes Father    Hypertension Father    Hyperlipidemia Mother        trig   Hypertension Sister        HLD   Colon cancer Neg Hx    Esophageal cancer Neg Hx    Rectal cancer Neg Hx    Stomach cancer Neg Hx    Prostate cancer Neg Hx    Social History:  reports that he has been smoking cigarettes and cigars. He has a 40.00 pack-year smoking history. He has never used smokeless tobacco. He reports current alcohol use of about 2.0 - 4.0 standard drinks of alcohol per week. He reports that he does not use drugs.  Allergies:  Allergies  Allergen Reactions   Lisinopril Cough    No medications prior to admission.    No results found for this or any previous visit (from the past 48 hour(s)). No results found.  Review of Systems  Constitutional:  Negative for chills and fever.  All other systems reviewed and are negative.   Height 5' 9"$  (1.753 m), weight 84.8 kg. Physical Exam Vitals reviewed.  HENT:     Head: Normocephalic.  Eyes:     Pupils: Pupils are equal, round, and reactive to light.  Cardiovascular:     Rate and Rhythm: Normal rate.  Pulmonary:     Effort: Pulmonary effort is normal.  Abdominal:     General: Abdomen is flat.  Genitourinary:    Comments: No CVAT at present Musculoskeletal:        General: Normal range of motion.     Cervical back: Normal range of motion.  Skin:    General: Skin is warm.  Neurological:     General: No focal deficit present.     Mental Status: He is alert.  Psychiatric:        Mood and Affect: Mood normal.       Assessment/Plan  Proceed as planned with RIGHT ureteroscopic stone manipulation. Risks, benefits, alternatives, expectred peri-op course discussed previousliy and reiterated today.   Alexis Frock, MD 08/21/2022, 7:15 AM

## 2022-08-24 ENCOUNTER — Encounter (HOSPITAL_BASED_OUTPATIENT_CLINIC_OR_DEPARTMENT_OTHER): Payer: Self-pay | Admitting: Urology

## 2022-08-25 ENCOUNTER — Encounter: Payer: Self-pay | Admitting: Gastroenterology

## 2022-10-12 ENCOUNTER — Telehealth: Payer: Self-pay | Admitting: *Deleted

## 2022-10-12 NOTE — Telephone Encounter (Signed)
Attempted to reach pt for pre-visit. LM and gave call back #. And informed I will  attempt again in 10 min.

## 2022-10-12 NOTE — Telephone Encounter (Signed)
Second attempted to reach patient for pre-visit and was unable to reach pt. LM informing patient that they need to call by the end of the day and reschedule their pre-visit or procedure will be canceled per protocol. Number left on message for them to facilitate this issue.

## 2022-10-14 ENCOUNTER — Ambulatory Visit (AMBULATORY_SURGERY_CENTER): Payer: No Typology Code available for payment source

## 2022-10-14 ENCOUNTER — Encounter: Payer: Self-pay | Admitting: Gastroenterology

## 2022-10-14 VITALS — Ht 69.0 in | Wt 184.0 lb

## 2022-10-14 DIAGNOSIS — Z86010 Personal history of colonic polyps: Secondary | ICD-10-CM

## 2022-10-14 MED ORDER — NA SULFATE-K SULFATE-MG SULF 17.5-3.13-1.6 GM/177ML PO SOLN: 1.0000 | Freq: Once | ORAL | 0 refills | Status: AC

## 2022-10-14 NOTE — Progress Notes (Signed)
No egg or soy allergy known to patient;  No issues known to pt with past sedation with any surgeries or procedures; Patient denies ever being told they had issues or difficulty with intubation;  No FH of Malignant Hyperthermia; Pt is not on diet pills; Pt is not on home 02;  Pt is not on blood thinners;  Pt denies issues with constipation  No A fib or A flutter Have any cardiac testing pending--NO Pt instructed to use [email or web address] or GoodRx for a price reduction on prep   Insurance verified during PV appt=[employer]  Patient's chart reviewed by Cathlyn Parsons CNRA prior to previsit and patient appropriate for the [facility].  Previsit completed and red dot placed by patient's name on their procedure day (on provider's schedule).    Instructions printed and [facility] GoodRx coupon given to patient at [facility];

## 2022-10-28 ENCOUNTER — Ambulatory Visit: Payer: No Typology Code available for payment source | Admitting: Internal Medicine

## 2022-10-28 ENCOUNTER — Ambulatory Visit: Payer: No Typology Code available for payment source

## 2022-11-05 ENCOUNTER — Other Ambulatory Visit: Payer: Self-pay | Admitting: Internal Medicine

## 2022-11-09 ENCOUNTER — Telehealth: Payer: Self-pay | Admitting: Internal Medicine

## 2022-11-09 NOTE — Telephone Encounter (Signed)
Pt would like a callback regarding upcoming test. He states that he injured his left foot over the weekend and would like nurses opinion on whether he should have test done or not. Please advise

## 2022-11-09 NOTE — Telephone Encounter (Signed)
Spoke with pt who states he is having a current flare of gout in his great toe.  Pt is currently taking Allopurinol and will also start Ibuprofen tomorrow after his Colonoscopy to try to get better pain control.   Pt advised to not cancel treadmill testing yet but rather wait until Thursday morning.  Will forward message to K. Steigelman to make her aware.  Pt verbalizes understanding and thanked Charity fundraiser for the callback.

## 2022-11-10 ENCOUNTER — Encounter: Payer: Self-pay | Admitting: Gastroenterology

## 2022-11-10 ENCOUNTER — Ambulatory Visit (AMBULATORY_SURGERY_CENTER): Payer: No Typology Code available for payment source | Admitting: Gastroenterology

## 2022-11-10 ENCOUNTER — Other Ambulatory Visit: Payer: Self-pay | Admitting: Acute Care

## 2022-11-10 VITALS — BP 120/80 | HR 61 | Temp 98.0°F | Resp 18 | Ht 69.0 in | Wt 184.0 lb

## 2022-11-10 DIAGNOSIS — D127 Benign neoplasm of rectosigmoid junction: Secondary | ICD-10-CM

## 2022-11-10 DIAGNOSIS — D123 Benign neoplasm of transverse colon: Secondary | ICD-10-CM | POA: Diagnosis not present

## 2022-11-10 DIAGNOSIS — Z86010 Personal history of colonic polyps: Secondary | ICD-10-CM

## 2022-11-10 DIAGNOSIS — D128 Benign neoplasm of rectum: Secondary | ICD-10-CM | POA: Diagnosis not present

## 2022-11-10 DIAGNOSIS — Z09 Encounter for follow-up examination after completed treatment for conditions other than malignant neoplasm: Secondary | ICD-10-CM

## 2022-11-10 DIAGNOSIS — Z122 Encounter for screening for malignant neoplasm of respiratory organs: Secondary | ICD-10-CM

## 2022-11-10 DIAGNOSIS — Z87891 Personal history of nicotine dependence: Secondary | ICD-10-CM

## 2022-11-10 MED ORDER — SODIUM CHLORIDE 0.9 % IV SOLN: 500.0000 mL | Freq: Once | INTRAVENOUS | Status: DC

## 2022-11-10 NOTE — Patient Instructions (Signed)
Follow a high fiber diet. Resume previous medications. Awaiting pathology results. Repeat Colonoscopy date to be determined based on pathology results. Handouts provided on: Colon polyps, Diverticulosis and Hemorrhoids.  YOU HAD AN ENDOSCOPIC PROCEDURE TODAY AT THE [facility]:   Refer to the procedure report that was given to you for any specific questions about what was found during the examination.  If the procedure report does not answer your questions, please call your gastroenterologist to clarify.  If you requested that your care partner not be given the details of your procedure findings, then the procedure report has been included in a sealed envelope for you to review at your convenience later.  YOU SHOULD EXPECT: Some feelings of bloating in the abdomen. Passage of more gas than usual.  Walking can help get rid of the air that was put into your GI tract during the procedure and reduce the bloating. If you had a lower endoscopy (such as a colonoscopy or flexible sigmoidoscopy) you may notice spotting of blood in your stool or on the toilet paper. If you underwent a bowel prep for your procedure, you may not have a normal bowel movement for a few days.  Please Note:  You might notice some irritation and congestion in your nose or some drainage.  This is from the oxygen used during your procedure.  There is no need for concern and it should clear up in a day or so.  SYMPTOMS TO REPORT IMMEDIATELY:  Following lower endoscopy (colonoscopy or flexible sigmoidoscopy):  Excessive amounts of blood in the stool  Significant tenderness or worsening of abdominal pains  Swelling of the abdomen that is new, acute  Fever of 100F or higher  For urgent or emergent issues, a gastroenterologist can be reached at any hour by calling [phone]. Do not use MyChart messaging for urgent concerns.    DIET:  We do recommend a small meal at first, but then you may proceed to your regular  diet.  Drink plenty of fluids but you should avoid alcoholic beverages for 24 hours.  ACTIVITY:  You should plan to take it easy for the rest of today and you should NOT DRIVE or use heavy machinery until tomorrow (because of the sedation medicines used during the test).    FOLLOW UP: Our staff will call the number listed on your records the next business day following your procedure.  We will call around 7:15- 8:00 am to check on you and address any questions or concerns that you may have regarding the information given to you following your procedure. If we do not reach you, we will leave a message.     If any biopsies were taken you will be contacted by phone or by letter within the next 1-3 weeks.  Please call us at [phone] if you have not heard about the biopsies in 3 weeks.    SIGNATURES/CONFIDENTIALITY: You and/or your care partner have signed paperwork which will be entered into your electronic medical record.  These signatures attest to the fact that that the information above on your After Visit Summary has been reviewed and is understood.  Full responsibility of the confidentiality of this discharge information lies with you and/or your care-partner.

## 2022-11-10 NOTE — Progress Notes (Signed)
Pt's states no medical or surgical changes since previsit or office visit. 

## 2022-11-10 NOTE — Progress Notes (Signed)
Called to room to assist during endoscopic procedure.  Patient ID and intended procedure confirmed with present staff. Received instructions for my participation in the procedure from the performing physician.  

## 2022-11-10 NOTE — Op Note (Signed)
[facility] Patient Name: [name] [name] Procedure Date: 11/10/2022 9:07 AM MRN: [id number] Endoscopist: Corliss Parish , MD, [id number] Age: 62 Referring MD:  Date of Birth: 08-19-1960 Gender: Male Account #: [id number] Procedure:                Colonoscopy Indications:              Surveillance: Personal history of adenomatous                            polyps on last colonoscopy > 5 years ago Medicines:                Monitored Anesthesia Care Procedure:                Pre-Anesthesia Assessment:                           - Prior to the procedure, a History and Physical                            was performed, and patient medications and                            allergies were reviewed. The patient's tolerance of                            previous anesthesia was also reviewed. The risks                            and benefits of the procedure and the sedation                            options and risks were discussed with the patient.                            All questions were answered, and informed consent                            was obtained. Prior Anticoagulants: The patient has                            taken no anticoagulant or antiplatelet agents. ASA                            Grade Assessment: II - A patient with mild systemic                            disease. After reviewing the risks and benefits,                            the patient was deemed in satisfactory condition to                            undergo the procedure.  After obtaining informed consent, the colonoscope                            was passed under direct vision. Throughout the                            procedure, the patient's blood pressure, pulse, and                            oxygen saturations were monitored continuously. The                            CF [id number] #[id number] was introduced through the anus                            and advanced  to the 3 cm into the ileum. The                            colonoscopy was performed without difficulty. The                            patient tolerated the procedure. The quality of the                            bowel preparation was adequate. The terminal ileum,                            ileocecal valve, appendiceal orifice, and rectum                            were photographed. Scope In: 9:12:38 AM Scope Out: 9:29:29 AM Scope Withdrawal Time: 0 hours 13 minutes 33 seconds  Total Procedure Duration: 0 hours 16 minutes 51 seconds  Findings:                 Hemorrhoids were found on perianal exam.                           The terminal ileum and ileocecal valve appeared                            normal.                           Four sessile polyps were found in the rectum (1),                            recto-sigmoid colon (1) and hepatic flexure (2).                            The polyps were 1 to 4 mm in size. These polyps                            were removed with a cold snare. Resection and  retrieval were complete.                           Multiple small-mouthed diverticula were found in                            the recto-sigmoid colon and sigmoid colon.                           Normal mucosa was found in the entire colon                            otherwise.                           Non-bleeding non-thrombosed external and internal                            hemorrhoids were found during retroflexion, during                            perianal exam and during digital exam. The                            hemorrhoids were Grade II (internal hemorrhoids                            that prolapse but reduce spontaneously). Complications:            No immediate complications. Estimated Blood Loss:     Estimated blood loss was minimal. Impression:               - Hemorrhoids found on perianal exam.                           - The examined portion  of the ileum was normal.                           - Four, 1 to 4 mm polyps in the rectum, at the                            recto-sigmoid colon and at the hepatic flexure,                            removed with a cold snare. Resected and retrieved.                           - Diverticulosis in the recto-sigmoid colon and in                            the sigmoid colon.                           - Normal mucosa in the entire examined colon  otherwise.                           - Non-bleeding non-thrombosed external and internal                            hemorrhoids. Recommendation:           - The patient will be observed post-procedure,                            until all discharge criteria are met.                           - Discharge patient to home.                           - Patient has a contact number available for                            emergencies. The signs and symptoms of potential                            delayed complications were discussed with the                            patient. Return to normal activities tomorrow.                            Written discharge instructions were provided to the                            patient.                           - High fiber diet.                           - Use FiberCon 1-2 tablets PO daily.                           - Continue present medications.                           - Await pathology results.                           - Repeat colonoscopy in 3/5/7 years for                            surveillance based on pathology results and history                            of previous adenomatous colon polyps.                           - The findings and recommendations were discussed  with the patient.                           - The findings and recommendations were discussed                            with the patient's family. Corliss Parish, MD 11/10/2022  9:36:48 AM

## 2022-11-10 NOTE — Progress Notes (Signed)
GASTROENTEROLOGY PROCEDURE H&P NOTE   Primary Care Physician: Shelva Majestic, MD  HPI: [name] [name] is a 62 y.o. male who presents for Colonoscopy for surveillance of previous adenomas.  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    on meds   History of adenomatous polyp of colon    05-27-2015   History of syncope 10/2020   pcp referred pt to cardiology, seen by dr hochrein, note 12-26-2020 in epic;  work-up includes event monitor 12-03-2020 showed 33% PVCs w/ occ nonsustained VT runs,  echo 12-04-2020 ef 55-55% freq PVCs GiDD,  ETT 12-27-2020 showed negative for ischemia   Hyperlipidemia    on meds   Hypertension    on meds   Idiopathic chronic gout of multiple sites without tophus    followed by pcp;   LBBB (left bundle branch block) 02/2021   Neuromuscular disorder (HCC)    Pre-diabetes    Pulmonary emphysema (HCC)    per pt pcp note 2022;   pt quit cigarettes 2014  but still smokes cigar , average 2 per month   PVC's (premature ventricular contractions)    08-14-2022 per pt asymptomatic   Raynaud's syndrome    Renal calculus, right 09/2021   Seasonal allergies    Testicular hypogonadism    Wears glasses    Past Surgical History:  Procedure Laterality Date   BICEPS TENDON REPAIR Right 2003   COLONOSCOPY  05/27/2015   DJ-MAC-suprep(exc)-TA x 2   CYSTOSCOPY WITH RETROGRADE PYELOGRAM, URETEROSCOPY AND STENT PLACEMENT Right 01/01/2021   Procedure: CYSTOSCOPY WITH RETROGRADE PYELOGRAM, URETEROSCOPY AND STENT PLACEMENT;  Surgeon: Sebastian Ache, MD;  Location: [facility] [place];  Service: Urology;  Laterality: Right;  90 MINS   CYSTOSCOPY WITH RETROGRADE  PYELOGRAM, URETEROSCOPY AND STENT PLACEMENT Right 01/10/2021   Procedure: CYSTOSCOPY WITH RETROGRADE PYELOGRAM, URETEROSCOPY AND STENT EXCHANGE;  Surgeon: Sebastian Ache, MD;  Location: [facility];  Service: Urology;  Laterality: Right;   CYSTOSCOPY WITH RETROGRADE PYELOGRAM, URETEROSCOPY AND STENT PLACEMENT Right 08/21/2022   Procedure: CYSTOSCOPY WITH RETROGRADE PYELOGRAM, URETEROSCOPY AND STENT PLACEMENT stone basket extracrion;  Surgeon: Sebastian Ache, MD;  Location: [facility];  Service: Urology;  Laterality: Right;   CYSTOSCOPY WITH URETEROSCOPY, STONE BASKETRY AND STENT PLACEMENT Right 09/27/2015   Procedure: 2ND STAGE CYSTOSCOPY RIGHT URETEROSCOPY STENT REPLACEMENT;  Surgeon: Sebastian Ache, MD;  Location: [facility];  Service: Urology;  Laterality: Right;   CYSTOSCOPY/URETEROSCOPY/HOLMIUM LASER/STENT PLACEMENT Right 09/13/2015   Procedure: 1ST STAGE - CYSTOSCOPY/URETEROSCOPY/HOLMIUM LASER/STENT PLACEMENT;  Surgeon: Sebastian Ache, MD;  Location: [facility];  Service: Urology;  Laterality: Right;   HOLMIUM LASER APPLICATION Right 09/13/2015   Procedure: HOLMIUM LASER APPLICATION;  Surgeon: Sebastian Ache, MD;  Location: [facility];  Service: Urology;  Laterality: Right;   HOLMIUM LASER APPLICATION Right 09/27/2015   Procedure: HOLMIUM LASER;  Surgeon: Sebastian Ache, MD;  Location: [facility];  Service: Urology;  Laterality: Right;   HOLMIUM LASER APPLICATION Right 01/01/2021   Procedure: 1ST STAGE HOLMIUM LASER APPLICATION;  Surgeon: Sebastian Ache, MD;  Location: [facility];  Service: Urology;  Laterality: Right;  HOLMIUM LASER APPLICATION Right 01/10/2021   Procedure: HOLMIUM LASER APPLICATION;  Surgeon: Sebastian Ache, MD;  Location: [facility];  Service: Urology;  Laterality: Right;   HOLMIUM LASER APPLICATION Right 08/21/2022   Procedure: HOLMIUM LASER  APPLICATION;  Surgeon: Sebastian Ache, MD;  Location: [facility];  Service: Urology;  Laterality: Right;   TOTAL SHOULDER ARTHROPLASTY Right 08/17/2013   RIGHT TOTAL SHOULDER ARTHROPLASTY;  Surgeon: Senaida Lange, MD;  Location: [facility];  Service: Orthopedics;  Laterality: Right;   WISDOM TOOTH EXTRACTION     no anesthesia   Current Outpatient Medications  Medication Sig Dispense Refill   allopurinol (ZYLOPRIM) 300 MG tablet Take 300 mg by mouth daily.     amLODipine (NORVASC) 5 MG tablet Take 1 tablet (5 mg total) by mouth daily. (Patient taking differently: Take 5 mg by mouth daily.) 90 tablet 3   atorvastatin (LIPITOR) 20 MG tablet TAKE 1 TABLET BY MOUTH EVERY DAY 90 tablet 3   nebivolol (BYSTOLIC) 5 MG tablet TAKE 1 TABLET (5 MG TOTAL) BY MOUTH DAILY. 90 tablet 3   Potassium Citrate 15 MEQ (1620 MG) TBCR Take 2 tablets by mouth 2 (two) times daily.     fluorouracil (EFUDEX) 5 % cream Apply 1 Application topically daily as needed.     fluticasone (FLONASE) 50 MCG/ACT nasal spray Place 1 spray into both nostrils daily as needed for allergies or rhinitis.     metroNIDAZOLE (METROCREAM) 0.75 % cream Apply 1 Application topically 2 (two) times daily.     sildenafil (REVATIO) 20 MG tablet Take 20 mg by mouth as needed (ed).     Current Facility-Administered Medications  Medication Dose Route Frequency Provider Last Rate Last Admin   0.9 %  sodium chloride infusion  500 mL Intravenous Once Mansouraty, Netty Starring., MD        Current Outpatient Medications:    allopurinol (ZYLOPRIM) 300 MG tablet, Take 300 mg by mouth daily., Disp: , Rfl:    amLODipine (NORVASC) 5 MG tablet, Take 1 tablet (5 mg total) by mouth daily. (Patient taking differently: Take 5 mg by mouth daily.), Disp: 90 tablet, Rfl: 3   atorvastatin (LIPITOR) 20 MG tablet, TAKE 1 TABLET BY MOUTH EVERY DAY, Disp: 90 tablet, Rfl: 3   nebivolol (BYSTOLIC) 5 MG tablet, TAKE 1 TABLET (5 MG TOTAL) BY MOUTH DAILY., Disp: 90  tablet, Rfl: 3   Potassium Citrate 15 MEQ (1620 MG) TBCR, Take 2 tablets by mouth 2 (two) times daily., Disp: , Rfl:    fluorouracil (EFUDEX) 5 % cream, Apply 1 Application topically daily as needed., Disp: , Rfl:    fluticasone (FLONASE) 50 MCG/ACT nasal spray, Place 1 spray into both nostrils daily as needed for allergies or rhinitis., Disp: , Rfl:    metroNIDAZOLE (METROCREAM) 0.75 % cream, Apply 1 Application topically 2 (two) times daily., Disp: , Rfl:    sildenafil (REVATIO) 20 MG tablet, Take 20 mg by mouth as needed (ed)., Disp: , Rfl:   Current Facility-Administered Medications:    0.9 %  sodium chloride infusion, 500 mL, Intravenous, Once, Mansouraty, Netty Starring., MD Allergies  Allergen Reactions   Lisinopril Cough   Family History  Problem Relation Age of Onset   Hyperlipidemia Mother        trig   Coronary artery disease Father        CABG 13, former smoker   Diabetes Father    Hypertension Father    Hypertension Sister  HLD   Colon cancer Neg Hx    Esophageal cancer Neg Hx    Rectal cancer Neg Hx    Stomach cancer Neg Hx    Prostate cancer Neg Hx    Colon polyps Neg Hx    Social History   Socioeconomic History   Marital status: Married    Spouse name: Not on file   Number of children: Not on file   Years of education: Not on file   Highest education level: Not on file  Occupational History   Not on file  Tobacco Use   Smoking status: Some Days    Packs/day: 0.25    Years: 40.00    Additional pack years: 0.00    Total pack years: 10.00    Types: Cigarettes, Cigars   Smokeless tobacco: Never   Tobacco comments:    08-14-2022 quit cigarett's 2014 but currently 2 cigar per month and a pack per 2 weeks of cig  Vaping Use   Vaping Use: Never used  Substance and Sexual Activity   Alcohol use: Yes    Alcohol/week: 0.0 - 14.0 standard drinks of alcohol   Drug use: Never   Sexual activity: Not on file  Other Topics Concern   Not on file  Social  History Narrative   Married. Son and daughter (65 and 42 in 2021).  Grandson in [place] with daughter summer 2021.       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 GEN: NAD EYE: Sclerae anicteric ENT: MMM CV: Non-tachycardic GI: Soft, NT/ND NEURO:  Alert & Oriented x 3  Lab Results: No results for input(s): "WBC", "HGB", "HCT", "PLT" in the last 72 hours. BMET No results for input(s): "NA", "K", "CL", "CO2", "GLUCOSE", "BUN", "CREATININE", "CALCIUM" in the last 72 hours. LFT No results for input(s): "PROT", "ALBUMIN", "AST", "ALT", "ALKPHOS", "BILITOT", "BILIDIR", "IBILI" in the last 72 hours. PT/INR No results for input(s): "LABPROT", "INR" in the last 72 hours.   Impression / Plan: This is a 62 y.o.male who presents for Colonoscopy for surveillance of previous adenomas.  The risks and benefits of endoscopic evaluation/treatment were discussed with the patient and/or family; these include but are not limited to the risk of perforation, infection, bleeding, missed lesions, lack of diagnosis, severe illness requiring hospitalization, as well as anesthesia and sedation related illnesses.  The patient's history has been reviewed, patient examined, no change in status, and deemed stable for procedure.  The patient and/or family is agreeable  to proceed.    Corliss Parish, MD [facility] Advanced Endoscopy Office # [phone]

## 2022-11-10 NOTE — Progress Notes (Signed)
Vss nad trans to pacu 

## 2022-11-11 ENCOUNTER — Telehealth: Payer: Self-pay | Admitting: *Deleted

## 2022-11-11 NOTE — Telephone Encounter (Signed)
  Follow up Call-     11/10/2022    8:06 AM  Call back number  Post procedure Call Back phone  # [phone]  Permission to leave phone message Yes     Patient questions:  Do you have a fever, pain , or abdominal swelling? No. Pain Score  0 *  Have you tolerated food without any problems? Yes.    Have you been able to return to your normal activities? Yes.    Do you have any questions about your discharge instructions: Diet   No. Medications  No. Follow up visit  No.  Do you have questions or concerns about your Care? No.  Actions: * If pain score is 4 or above: No action needed, pain <4.

## 2022-11-12 ENCOUNTER — Ambulatory Visit: Payer: No Typology Code available for payment source | Attending: Cardiology

## 2022-11-12 ENCOUNTER — Ambulatory Visit (INDEPENDENT_AMBULATORY_CARE_PROVIDER_SITE_OTHER): Payer: No Typology Code available for payment source | Admitting: Internal Medicine

## 2022-11-12 DIAGNOSIS — I4729 Other ventricular tachycardia: Secondary | ICD-10-CM

## 2022-11-12 DIAGNOSIS — R55 Syncope and collapse: Secondary | ICD-10-CM

## 2022-11-12 DIAGNOSIS — Z79899 Other long term (current) drug therapy: Secondary | ICD-10-CM

## 2022-11-12 DIAGNOSIS — I493 Ventricular premature depolarization: Secondary | ICD-10-CM

## 2022-11-12 DIAGNOSIS — I429 Cardiomyopathy, unspecified: Secondary | ICD-10-CM

## 2022-11-12 LAB — EXERCISE TOLERANCE TEST
Angina Index: 0
Estimated workload: 13.4
Exercise duration (min): 5 min
Exercise duration (sec): 19 s
MPHR: 160 {beats}/min
Peak HR: 141 {beats}/min
Percent HR: 88 %
Rest HR: 60 {beats}/min

## 2022-11-12 MED ORDER — SPIRONOLACTONE 25 MG PO TABS: 25.0000 mg | ORAL_TABLET | Freq: Every day | ORAL | 3 refills | Status: DC

## 2022-11-12 NOTE — Patient Instructions (Signed)
Medication Instructions:  Your physician has recommended you make the following change in your medication:   ** Stop Potassium and Amlodipine  ** Begin Spironolactone 25mg  - 1 tablet by mouth daily  *If you need a refill on your cardiac medications before your next appointment, please call your pharmacy*   Lab Work: BMET in 2 weeks  If you have labs (blood work) drawn today and your tests are completely normal, you will receive your results only by: MyChart Message (if you have MyChart) OR A paper copy in the mail If you have any lab test that is abnormal or we need to change your treatment, we will call you to review the results.   Testing/Procedures: None ordered.    Follow-Up: At [facility], you and your health needs are our priority.  As part of our continuing mission to provide you with exceptional heart care, we have created designated Provider Care Teams.  These Care Teams include your primary Cardiologist (physician) and Advanced Practice Providers (APPs -  Physician Assistants and Nurse Practitioners) who all work together to provide you with the care you need, when you need it.  We recommend signing up for the patient portal called "MyChart".  Sign up information is provided on this After Visit Summary.  MyChart is used to connect with patients for Virtual Visits (Telemedicine).  Patients are able to view lab/test results, encounter notes, upcoming appointments, etc.  Non-urgent messages can be sent to your provider as well.   To learn more about what you can do with MyChart, go to [email or web address].    Your next appointment:   6 months with Dr Graciela Husbands

## 2022-11-13 ENCOUNTER — Encounter: Payer: Self-pay | Admitting: Gastroenterology

## 2022-11-23 ENCOUNTER — Encounter: Payer: Self-pay | Admitting: Family Medicine

## 2022-11-23 ENCOUNTER — Ambulatory Visit (INDEPENDENT_AMBULATORY_CARE_PROVIDER_SITE_OTHER): Payer: No Typology Code available for payment source | Admitting: Family Medicine

## 2022-11-23 VITALS — BP 130/88 | HR 65 | Temp 98.3°F | Resp 16 | Ht 70.0 in | Wt 182.4 lb

## 2022-11-23 DIAGNOSIS — M1009 Idiopathic gout, multiple sites: Secondary | ICD-10-CM

## 2022-11-23 LAB — COMPREHENSIVE METABOLIC PANEL
ALT: 35 U/L (ref 0–53)
AST: 29 U/L (ref 0–37)
Albumin: 4.3 g/dL (ref 3.5–5.2)
Alkaline Phosphatase: 95 U/L (ref 39–117)
BUN: 16 mg/dL (ref 6–23)
CO2: 24 mEq/L (ref 19–32)
Calcium: 9.9 mg/dL (ref 8.4–10.5)
Chloride: 104 mEq/L (ref 96–112)
Creatinine, Ser: 1.05 mg/dL (ref 0.40–1.50)
GFR: 77.26 mL/min (ref >60.00)
Glucose, Bld: 114 mg/dL — ABNORMAL HIGH (ref 70–99)
Potassium: 4.9 mEq/L (ref 3.5–5.1)
Sodium: 139 mEq/L (ref 135–145)
Total Bilirubin: 0.4 mg/dL (ref 0.2–1.2)
Total Protein: 7 g/dL (ref 6.0–8.3)

## 2022-11-23 LAB — URIC ACID: Uric Acid, Serum: 5.8 mg/dL (ref 4.0–7.8)

## 2022-11-23 MED ORDER — COLCHICINE 0.6 MG PO TABS: ORAL_TABLET | ORAL | 1 refills | Status: DC

## 2022-11-23 NOTE — Patient Instructions (Signed)
Cochicine for gout.   Work on diet.   Can still take ibuprofen.

## 2022-11-23 NOTE — Progress Notes (Signed)
Subjective:     Patient ID: [name] [name], male    DOB: 25-Mar-1961, 62 y.o.   MRN: [id number]  Chief Complaint  Patient presents with   Gout    Gout flare-up in right pinky toe, last couple of months he has had 5 or 6 flare-ups since starting allopurinol     HPI Gout flare right great toe-5-6 flares since starting allopurinol.   Switching feet.  Was on left great toe, then right foot. More flares in 2 months-on allopurinol for kidney stones from urological.  Drinking plenty of water.  Watching diet mostly.   There are no preventive care reminders to display for this patient.  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    on meds   History of adenomatous polyp of colon    05-27-2015   History of syncope 10/2020   pcp referred pt to cardiology, seen by dr hochrein, note 12-26-2020 in epic;  work-up includes event monitor 12-03-2020 showed 33% PVCs w/ occ nonsustained VT runs,  echo 12-04-2020 ef 55-55% freq PVCs GiDD,  ETT 12-27-2020 showed negative for ischemia   Hyperlipidemia    on meds   Hypertension    on meds   Idiopathic chronic gout of multiple sites without tophus    followed by pcp;   LBBB (left bundle branch block) 02/2021   Neuromuscular disorder (HCC)    Pre-diabetes    Pulmonary emphysema (HCC)    per pt pcp note 2022;   pt quit cigarettes 2014  but still smokes cigar , average 2 per month   PVC's (premature ventricular contractions)    08-14-2022 per pt asymptomatic   Raynaud's syndrome    Renal calculus, right 09/2021   Seasonal allergies    Testicular hypogonadism    Wears glasses     Past Surgical History:  Procedure Laterality Date   BICEPS TENDON REPAIR  Right 2003   COLONOSCOPY  05/27/2015   DJ-MAC-suprep(exc)-TA x 2   CYSTOSCOPY WITH RETROGRADE PYELOGRAM, URETEROSCOPY AND STENT PLACEMENT Right 01/01/2021   Procedure: CYSTOSCOPY WITH RETROGRADE PYELOGRAM, URETEROSCOPY AND STENT PLACEMENT;  Surgeon: Sebastian Ache, MD;  Location: [facility] [place];  Service: Urology;  Laterality: Right;  90 MINS   CYSTOSCOPY WITH RETROGRADE PYELOGRAM, URETEROSCOPY AND STENT PLACEMENT Right 01/10/2021   Procedure: CYSTOSCOPY WITH RETROGRADE PYELOGRAM, URETEROSCOPY AND STENT EXCHANGE;  Surgeon: Sebastian Ache, MD;  Location: [facility];  Service: Urology;  Laterality: Right;   CYSTOSCOPY WITH RETROGRADE PYELOGRAM, URETEROSCOPY AND STENT PLACEMENT Right 08/21/2022   Procedure: CYSTOSCOPY WITH RETROGRADE PYELOGRAM, URETEROSCOPY AND STENT PLACEMENT stone basket extracrion;  Surgeon: Sebastian Ache, MD;  Location: [facility] [place];  Service: Urology;  Laterality: Right;   CYSTOSCOPY WITH URETEROSCOPY, STONE BASKETRY AND STENT PLACEMENT Right 09/27/2015   Procedure: 2ND STAGE CYSTOSCOPY RIGHT URETEROSCOPY STENT REPLACEMENT;  Surgeon: Sebastian Ache, MD;  Location: [facility];  Service: Urology;  Laterality: Right;   CYSTOSCOPY/URETEROSCOPY/HOLMIUM LASER/STENT PLACEMENT Right 09/13/2015   Procedure: 1ST STAGE - CYSTOSCOPY/URETEROSCOPY/HOLMIUM LASER/STENT PLACEMENT;  Surgeon: Sebastian Ache, MD;  Location: [facility];  Service: Urology;  Laterality: Right;   HOLMIUM LASER APPLICATION Right 09/13/2015  Procedure: HOLMIUM LASER APPLICATION;  Surgeon: Sebastian Ache, MD;  Location: [facility];  Service: Urology;  Laterality: Right;   HOLMIUM LASER APPLICATION Right 09/27/2015   Procedure: HOLMIUM LASER;  Surgeon: Sebastian Ache, MD;  Location: [facility];  Service: Urology;  Laterality: Right;   HOLMIUM LASER APPLICATION Right 01/01/2021   Procedure: 1ST STAGE HOLMIUM LASER  APPLICATION;  Surgeon: Sebastian Ache, MD;  Location: [facility];  Service: Urology;  Laterality: Right;   HOLMIUM LASER APPLICATION Right 01/10/2021   Procedure: HOLMIUM LASER APPLICATION;  Surgeon: Sebastian Ache, MD;  Location: [facility];  Service: Urology;  Laterality: Right;   HOLMIUM LASER APPLICATION Right 08/21/2022   Procedure: HOLMIUM LASER APPLICATION;  Surgeon: Sebastian Ache, MD;  Location: [facility];  Service: Urology;  Laterality: Right;   LITHOTRIPSY  2024   TOTAL SHOULDER ARTHROPLASTY Right 08/17/2013   RIGHT TOTAL SHOULDER ARTHROPLASTY;  Surgeon: Senaida Lange, MD;  [facility];  Service: Orthopedics;  Laterality: Right;   WISDOM TOOTH EXTRACTION     no anesthesia     Current Outpatient Medications:    allopurinol (ZYLOPRIM) 300 MG tablet, Take 300 mg by mouth daily., Disp: , Rfl:    atorvastatin (LIPITOR) 20 MG tablet, TAKE 1 TABLET BY MOUTH EVERY DAY, Disp: 90 tablet, Rfl: 3   colchicine 0.6 MG tablet, Take 1.2 mg PO, one hour later by another 0.6 mg on first day; then 0.6 mg twice daily until attack resolved.1, Disp: 30 tablet, Rfl: 1   fluorouracil (EFUDEX) 5 % cream, Apply 1 Application topically daily as needed., Disp: , Rfl:    fluticasone (FLONASE) 50 MCG/ACT nasal spray, Place 1 spray into both nostrils daily as needed for allergies or rhinitis., Disp: , Rfl:    metroNIDAZOLE (METROCREAM) 0.75 % cream, Apply 1 Application topically 2 (two) times daily., Disp: , Rfl:    nebivolol (BYSTOLIC) 5 MG tablet, TAKE 1 TABLET (5 MG TOTAL) BY MOUTH DAILY., Disp: 90 tablet, Rfl: 3   sildenafil (REVATIO) 20 MG tablet, Take 20 mg by mouth as needed (ed)., Disp: , Rfl:    spironolactone (ALDACTONE) 25 MG tablet, Take 1 tablet (25 mg total) by mouth daily., Disp: 90 tablet, Rfl: 3  Allergies  Allergen Reactions   Lisinopril Cough   ROS neg/noncontributory except as noted HPI/below      Objective:     BP 130/88    Pulse 65   Temp 98.3 F (36.8 C) (Temporal)   Resp 16   Ht 5\' 10"  (1.778 m)   Wt 182 lb 6 oz (82.7 kg)   SpO2 97%   BMI 26.17 kg/m  Wt Readings from Last 3 Encounters:  11/23/22 182 lb 6 oz (82.7 kg)  11/10/22 184 lb (83.5 kg)  10/14/22 184 lb (83.5 kg)    Physical Exam   Gen: WDWN NAD HEENT: NCAT, conjunctiva not injected, sclera nonicteric MSK: no gross abnormalities.  NEURO: A&O x3.  CN II-XII intact.  PSYCH: normal mood. Good eye contact     Assessment & Plan:  Acute idiopathic gout of multiple sites -     Comprehensive metabolic panel -     Uric acid  Other orders -     Colchicine; Take 1.2 mg PO, one hour later by another 0.6 mg on first day; then 0.6 mg twice daily until attack resolved.1  Dispense: 30 tablet; Refill: 1   Gout-chronic, but flaring more since started on alopurinol  discussed  to be expected.  Will check CMP/uric acid as new medication(s).  Continue hydration.  Work on diet.  Colchicine 0.6 mg 2 at onset the 1 heart rate later 1 and then 1 daily for flare.  Or if frequent, can do daily for 1 month.    Angelena Sole, MD

## 2022-11-23 NOTE — Progress Notes (Signed)
Uric acid much better and kidney/liver function ok.  No change in plan

## 2022-11-26 ENCOUNTER — Ambulatory Visit: Payer: No Typology Code available for payment source | Admitting: Internal Medicine

## 2022-11-29 NOTE — Progress Notes (Signed)
No visti

## 2022-12-01 ENCOUNTER — Other Ambulatory Visit: Payer: Self-pay | Admitting: Family Medicine

## 2022-12-01 ENCOUNTER — Ambulatory Visit: Payer: No Typology Code available for payment source | Attending: Internal Medicine

## 2022-12-01 DIAGNOSIS — R55 Syncope and collapse: Secondary | ICD-10-CM

## 2022-12-01 DIAGNOSIS — I4729 Other ventricular tachycardia: Secondary | ICD-10-CM

## 2022-12-01 DIAGNOSIS — Z79899 Other long term (current) drug therapy: Secondary | ICD-10-CM

## 2022-12-01 DIAGNOSIS — I429 Cardiomyopathy, unspecified: Secondary | ICD-10-CM

## 2022-12-01 DIAGNOSIS — I493 Ventricular premature depolarization: Secondary | ICD-10-CM

## 2022-12-02 LAB — BASIC METABOLIC PANEL
BUN/Creatinine Ratio: 15 (ref 10–24)
BUN: 17 mg/dL (ref 8–27)
CO2: 17 mmol/L — ABNORMAL LOW (ref 20–29)
Calcium: 9.9 mg/dL (ref 8.6–10.2)
Chloride: 105 mmol/L (ref 96–106)
Creatinine, Ser: 1.12 mg/dL (ref 0.76–1.27)
Glucose: 99 mg/dL (ref 70–99)
Potassium: 4.4 mmol/L (ref 3.5–5.2)
Sodium: 139 mmol/L (ref 134–144)
eGFR: 75 mL/min/{1.73_m2} (ref >59)

## 2022-12-21 ENCOUNTER — Ambulatory Visit
Admission: RE | Admit: 2022-12-21 | Discharge: 2022-12-21 | Disposition: A | Discharge status: Home / Self Care | Payer: No Typology Code available for payment source | Source: Ambulatory Visit | Attending: Acute Care | Admitting: Acute Care

## 2022-12-21 ENCOUNTER — Other Ambulatory Visit: Payer: Self-pay | Admitting: Family Medicine

## 2022-12-21 DIAGNOSIS — Z122 Encounter for screening for malignant neoplasm of respiratory organs: Secondary | ICD-10-CM

## 2022-12-21 DIAGNOSIS — Z87891 Personal history of nicotine dependence: Secondary | ICD-10-CM

## 2022-12-28 ENCOUNTER — Other Ambulatory Visit: Payer: Self-pay

## 2022-12-28 DIAGNOSIS — Z87891 Personal history of nicotine dependence: Secondary | ICD-10-CM

## 2022-12-28 DIAGNOSIS — Z122 Encounter for screening for malignant neoplasm of respiratory organs: Secondary | ICD-10-CM

## 2023-01-12 ENCOUNTER — Other Ambulatory Visit: Payer: Self-pay | Admitting: Family Medicine

## 2023-04-05 ENCOUNTER — Encounter: Payer: No Typology Code available for payment source | Admitting: Family Medicine

## 2023-07-01 ENCOUNTER — Encounter: Payer: No Typology Code available for payment source | Admitting: Family Medicine

## 2023-08-02 ENCOUNTER — Encounter: Payer: Self-pay | Admitting: Family Medicine

## 2023-08-02 ENCOUNTER — Ambulatory Visit (INDEPENDENT_AMBULATORY_CARE_PROVIDER_SITE_OTHER): Payer: No Typology Code available for payment source | Admitting: Family Medicine

## 2023-08-02 VITALS — BP 120/78 | HR 70 | Temp 98.1°F | Ht 70.0 in | Wt 183.0 lb

## 2023-08-02 DIAGNOSIS — Z720 Tobacco use: Secondary | ICD-10-CM | POA: Diagnosis not present

## 2023-08-02 DIAGNOSIS — Z125 Encounter for screening for malignant neoplasm of prostate: Secondary | ICD-10-CM | POA: Diagnosis not present

## 2023-08-02 DIAGNOSIS — E785 Hyperlipidemia, unspecified: Secondary | ICD-10-CM | POA: Diagnosis not present

## 2023-08-02 DIAGNOSIS — R739 Hyperglycemia, unspecified: Secondary | ICD-10-CM

## 2023-08-02 DIAGNOSIS — I4729 Other ventricular tachycardia: Secondary | ICD-10-CM

## 2023-08-02 DIAGNOSIS — Z0000 Encounter for general adult medical examination without abnormal findings: Secondary | ICD-10-CM

## 2023-08-02 DIAGNOSIS — I429 Cardiomyopathy, unspecified: Secondary | ICD-10-CM

## 2023-08-02 DIAGNOSIS — Z23 Encounter for immunization: Secondary | ICD-10-CM | POA: Diagnosis not present

## 2023-08-02 DIAGNOSIS — J439 Emphysema, unspecified: Secondary | ICD-10-CM

## 2023-08-02 DIAGNOSIS — M1A09X0 Idiopathic chronic gout, multiple sites, without tophus (tophi): Secondary | ICD-10-CM

## 2023-08-02 DIAGNOSIS — Z131 Encounter for screening for diabetes mellitus: Secondary | ICD-10-CM

## 2023-08-02 LAB — LIPID PANEL
Cholesterol: 198 mg/dL (ref 0–200)
HDL: 53.9 mg/dL (ref >39.00)
NonHDL: 144.07
Total CHOL/HDL Ratio: 4
Triglycerides: 403 mg/dL — ABNORMAL HIGH (ref 0.0–149.0)
VLDL: 80.6 mg/dL — ABNORMAL HIGH (ref 0.0–40.0)

## 2023-08-02 LAB — COMPREHENSIVE METABOLIC PANEL
ALT: 46 U/L (ref 0–53)
AST: 38 U/L — ABNORMAL HIGH (ref 0–37)
Albumin: 4.7 g/dL (ref 3.5–5.2)
Alkaline Phosphatase: 78 U/L (ref 39–117)
BUN: 16 mg/dL (ref 6–23)
CO2: 24 meq/L (ref 19–32)
Calcium: 9.9 mg/dL (ref 8.4–10.5)
Chloride: 101 meq/L (ref 96–112)
Creatinine, Ser: 1.06 mg/dL (ref 0.40–1.50)
GFR: 76.02 mL/min (ref >60.00)
Glucose, Bld: 114 mg/dL — ABNORMAL HIGH (ref 70–99)
Potassium: 4.6 meq/L (ref 3.5–5.1)
Sodium: 135 meq/L (ref 135–145)
Total Bilirubin: 0.5 mg/dL (ref 0.2–1.2)
Total Protein: 7.5 g/dL (ref 6.0–8.3)

## 2023-08-02 LAB — CBC WITH DIFFERENTIAL/PLATELET
Basophils Absolute: 0.1 10*3/uL (ref 0.0–0.1)
Basophils Relative: 1.2 % (ref 0.0–3.0)
Eosinophils Absolute: 0.3 10*3/uL (ref 0.0–0.7)
Eosinophils Relative: 3 % (ref 0.0–5.0)
HCT: 50.1 % (ref 39.0–52.0)
Hemoglobin: 16.6 g/dL (ref 13.0–17.0)
Lymphocytes Relative: 20.4 % (ref 12.0–46.0)
Lymphs Abs: 2.1 10*3/uL (ref 0.7–4.0)
MCHC: 33.1 g/dL (ref 30.0–36.0)
MCV: 92.8 fL (ref 78.0–100.0)
Monocytes Absolute: 0.5 10*3/uL (ref 0.1–1.0)
Monocytes Relative: 5 % (ref 3.0–12.0)
Neutro Abs: 7.3 10*3/uL (ref 1.4–7.7)
Neutrophils Relative %: 70.4 % (ref 43.0–77.0)
Platelets: 246 10*3/uL (ref 150.0–400.0)
RBC: 5.4 Mil/uL (ref 4.22–5.81)
RDW: 13.7 % (ref 11.5–15.5)
WBC: 10.4 10*3/uL (ref 4.0–10.5)

## 2023-08-02 LAB — LDL CHOLESTEROL, DIRECT: Direct LDL: 84 mg/dL

## 2023-08-02 LAB — URIC ACID: Uric Acid, Serum: 4.2 mg/dL (ref 4.0–7.8)

## 2023-08-02 LAB — HEMOGLOBIN A1C: Hgb A1c MFr Bld: 7 % — ABNORMAL HIGH (ref 4.6–6.5)

## 2023-08-02 NOTE — Patient Instructions (Addendum)
blood pressure is controlled but some nipple tenderness on spironolactone and when he finishes his current medication he is going to message me as we suspect this is related to medicine- we will switch to eplerenone at that time- asked him to give me at least 2 weeks before he runs out to make the change  Please stop by lab before you go If you have mychart- we will send your results within 3 business days of [place] receiving them.  If you do not have mychart- we will call you about results within 5 business days of [place] receiving them.  *please also note that you will see labs on mychart as soon as they post. I will later go in and write notes on them- will say "notes from Dr. Durene Cal"   Recommended follow up: Return in about 6 months (around 01/30/2024) for followup or sooner if needed.Schedule b4 you leave.

## 2023-08-02 NOTE — Progress Notes (Signed)
Phone: [phone]   Subjective:  Patient presents today for their annual physical. Chief complaint-noted.   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)    followed by cardiologist--- dr Graciela Husbands   ED (erectile dysfunction)    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    on meds   History of adenomatous polyp of colon    05-27-2015   History of syncope 10/2020   pcp referred pt to cardiology, seen by dr hochrein, note 12-26-2020 in epic;  work-up includes event monitor 12-03-2020 showed 33% PVCs w/ occ nonsustained VT runs,  echo 12-04-2020 ef 55-55% freq PVCs GiDD,  ETT 12-27-2020 showed negative for ischemia   Hyperlipidemia    on meds   Hypertension    on meds   Idiopathic chronic gout of multiple sites without tophus    followed by pcp;   LBBB (left bundle branch block) 02/2021   Neuromuscular disorder (HCC)    Pre-diabetes    Pulmonary emphysema (HCC)    per pt pcp note 2022;   pt quit cigarettes 2014  but still smokes cigar , average 2 per month   PVC's (premature ventricular contractions)    08-14-2022 per pt asymptomatic   Raynaud's syndrome    Renal calculus, right 09/2021   Seasonal allergies    Testicular hypogonadism    Wears glasses    Patient Active Problem List   Diagnosis Date Noted   Emphysema of lung (HCC) 03/08/2020    Priority: High   Tobacco abuse 08/13/2014    Priority: High   Hyperglycemia 04/01/2022    Priority: Medium    Hyperlipidemia  05/26/2010    Priority: Medium    Gout 05/26/2010    Priority: Medium    Essential hypertension 05/26/2010    Priority: Medium    History of adenomatous polyp of colon 06/03/2015    Priority: Low   Allergic rhinitis 08/13/2014    Priority: Low   S/P shoulder replacement 08/17/2013    Priority: Low   Acne rosacea 05/05/2013    Priority: Low   PVC's (premature ventricular contractions) 12/08/2021   Cardiomyopathy (HCC) 12/08/2021   Syncope 04/06/2021   Ventricular tachycardia (HCC) 04/06/2021   Past Surgical History:  Procedure Laterality Date   BICEPS TENDON REPAIR Right 2003   COLONOSCOPY  05/27/2015   DJ-MAC-suprep(exc)-TA x 2   CYSTOSCOPY WITH RETROGRADE PYELOGRAM, URETEROSCOPY AND STENT PLACEMENT Right 01/01/2021   Procedure: CYSTOSCOPY WITH RETROGRADE PYELOGRAM, URETEROSCOPY AND STENT PLACEMENT;  Surgeon: Sebastian Ache, MD;  Location: [facility];  Service: Urology;  Laterality: Right;  90 MINS   CYSTOSCOPY WITH RETROGRADE PYELOGRAM, URETEROSCOPY AND STENT PLACEMENT Right 01/10/2021   Procedure: CYSTOSCOPY WITH RETROGRADE PYELOGRAM, URETEROSCOPY AND STENT EXCHANGE;  Surgeon: Sebastian Ache, MD;  Location: [facility];  Service: Urology;  Laterality: Right;   CYSTOSCOPY WITH RETROGRADE PYELOGRAM, URETEROSCOPY AND STENT PLACEMENT Right 08/21/2022   Procedure: CYSTOSCOPY WITH RETROGRADE PYELOGRAM, URETEROSCOPY AND STENT PLACEMENT stone basket extracrion;  Surgeon:  Sebastian Ache, MD;  Location: [facility];  Service: Urology;  Laterality: Right;   CYSTOSCOPY WITH URETEROSCOPY, STONE BASKETRY AND STENT PLACEMENT Right 09/27/2015   Procedure: 2ND STAGE CYSTOSCOPY RIGHT URETEROSCOPY STENT REPLACEMENT;  Surgeon: Sebastian Ache, MD;  Location: [facility];  Service: Urology;  Laterality: Right;   CYSTOSCOPY/URETEROSCOPY/HOLMIUM LASER/STENT PLACEMENT Right 09/13/2015   Procedure: 1ST STAGE - CYSTOSCOPY/URETEROSCOPY/HOLMIUM  LASER/STENT PLACEMENT;  Surgeon: Sebastian Ache, MD;  Location: [facility];  Service: Urology;  Laterality: Right;   HOLMIUM LASER APPLICATION Right 09/13/2015   Procedure: HOLMIUM LASER APPLICATION;  Surgeon: Sebastian Ache, MD;  Location: [facility];  Service: Urology;  Laterality: Right;   HOLMIUM LASER APPLICATION Right 09/27/2015   Procedure: HOLMIUM LASER;  Surgeon: Sebastian Ache, MD;  Location: [facility]  Service: Urology;  Laterality: Right;   HOLMIUM LASER APPLICATION Right 01/01/2021   Procedure: 1ST STAGE HOLMIUM LASER APPLICATION;  Surgeon: Sebastian Ache, MD;  Location: [facility];  Service: Urology;  Laterality: Right;   HOLMIUM LASER APPLICATION Right 01/10/2021   Procedure: HOLMIUM LASER APPLICATION;  Surgeon: Sebastian Ache, MD;  Location: [facility];  Service: Urology;  Laterality: Right;   HOLMIUM LASER APPLICATION Right 08/21/2022   Procedure: HOLMIUM LASER APPLICATION;  Surgeon: Sebastian Ache, MD;  Location: [facility];  Service: Urology;  Laterality: Right;   LITHOTRIPSY  2024   TOTAL SHOULDER ARTHROPLASTY Right 08/17/2013   RIGHT TOTAL SHOULDER ARTHROPLASTY;  Surgeon: Senaida Lange, MD;  Location: [facility];  Service: Orthopedics;  Laterality: Right;   WISDOM TOOTH EXTRACTION     no anesthesia    Family History  Problem Relation Age of Onset   Hyperlipidemia Mother        trig   Coronary artery disease Father        CABG 70, former smoker   Diabetes Father    Hypertension Father    Hypertension Sister        HLD   Colon cancer Neg Hx    Esophageal cancer Neg Hx    Rectal cancer Neg Hx    Stomach cancer Neg Hx    Prostate cancer Neg Hx    Colon polyps Neg Hx     Medications- reviewed and updated Current Outpatient Medications  Medication Sig Dispense Refill   allopurinol (ZYLOPRIM) 300 MG tablet Take 300 mg by mouth daily.     atorvastatin (LIPITOR) 20 MG  tablet TAKE 1 TABLET BY MOUTH EVERY DAY 90 tablet 3   colchicine 0.6 MG tablet TAKE 2 TABS. 1 HOUR LATER ANOTHER 0.6 MG ON FIRST DAY THEN 0.6 MG TWICE DAILY UNTIL ATTACK RESOLVED 180 tablet 1   fluorouracil (EFUDEX) 5 % cream Apply 1 Application topically daily as needed.     fluticasone (FLONASE) 50 MCG/ACT nasal spray Place 1 spray into both nostrils daily as needed for allergies or rhinitis.     metroNIDAZOLE (METROCREAM) 0.75 % cream Apply 1 Application topically 2 (two) times daily.     nebivolol (BYSTOLIC) 5 MG tablet TAKE 1 TABLET (5 MG TOTAL) BY MOUTH DAILY. 90 tablet 3   sildenafil (REVATIO) 20 MG tablet Take 20 mg by mouth as needed (ed).     spironolactone (ALDACTONE) 25 MG tablet Take 1 tablet (25 mg total) by mouth daily. 90 tablet 3   No current facility-administered medications for this visit.    Allergies-reviewed and updated Allergies  Allergen Reactions   Lisinopril Cough  Social History   Social History Narrative   Married. Son and daughter (41 and 85 in 2021).  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 Oropharynx normal Neck: no thyromegaly CV: RRR no murmurs rubs or gallops Lungs: CTAB no crackles, wheeze, rhonchi Abdomen: soft/nontender/nondistended/normal bowel sounds. No rebound or guarding.  Ext: no edema Skin: warm, dry Neuro: grossly normal, moves all extremities, PERRLA Declines genitourinary or rectal exam today   Assessment and Plan  63 y.o. male presenting for annual physical.  Health Maintenance  counseling: 1. Anticipatory guidance: Patient counseled regarding regular dental exams -q6 months, eye exams -yearly,  avoiding smoking and second hand smoke- some rare cigars- encouraged full cessation- had quit until fishing trip- at least less frequent , limiting alcohol to 2 beverages per day - maybe 8 a week, no illicit drugs .   2. Risk factor reduction:  Advised patient of need for regular exercise and diet rich and fruits and vegetables to reduce risk of heart attack and stroke.  Exercise- doing a fair amount of sit ups, push ups, dumbbells, lunges, etc working from home - 3 days a week.  Diet/weight management-down 4 lbs from last year- made lunches healthier- less cheeseburgers- was easy to cut fries.  Wt Readings from Last 3 Encounters:  08/02/23 183 lb (83 kg)  11/23/22 182 lb 6 oz (82.7 kg)  11/10/22 184 lb (83.5 kg)  3. Immunizations/screenings/ancillary studies- Prevnar 20 today. Just had COVID shot but holding off on flu for now Immunization History  Administered Date(s) Administered   Influenza Whole 05/26/2010   Influenza, Quadrivalent, Recombinant, Inj, Pf 04/26/2019   Influenza,inj,Quad PF,6+ Mos 04/07/2016, 03/08/2020, 03/31/2021, 04/01/2022   Moderna Sars-Covid-2 Vaccination 10/23/2019, 11/20/2019   PFIZER(Purple Top)SARS-COV-2 Vaccination 06/12/2020   PNEUMOCOCCAL CONJUGATE-20 08/02/2023   Pfizer(Comirnaty)Fall Seasonal Vaccine 12 years and older 05/01/2022   Pneumococcal Polysaccharide-23 08/18/2013   Td 05/26/2010, 04/07/2016   Zoster Recombinant(Shingrix) 04/26/2019, 08/29/2019  4. Prostate cancer screening- has follow up with Dr. Berneice Heinrich in march- they do his PSAs , they also monitor kidney stones 5. Colon cancer screening - Dr. Meridee Score Nov 10 2022 with 7 year repeat planned with polyp history- had gout flare same day 6. Skin cancer screening- sees annually. advised regular sunscreen use. Denies worrisome, changing, or new skin lesions.  7. Smoking associated  screening (lung cancer screening, AAA screen 65-75, UA)- previously quit his cigars now using only sparingly- encouraged full cessation. Did have prior emphysema noted  (asymptomatic thankfully- monitor) so we want to prevent progression. Enrolled in lung cancer screening program. Will get UA with Dr. Berneice Heinrich.  8. STD screening - only active with wife   Status of chronic or acute concerns   eplerenone  #history of Syncope- has seen cardiology due to PVCs and v tach  followed by Dr. Graciela Husbands and on beta blockers (less fatigue on nebivolol 5 mg daily and has continued) -Raynauds noted- some consideration of changing to verapamil  if needed -cardiomyopathy- followed by Dr. Antoine Poche and Dr. Graciela Husbands- most recent cardiac MRI improved on 11/10/21 "Mild LV dysfunction and normal RV function.  Compared to prior study: LVEF has improved, LV size is now normal, decrease in ECV, LGE." -doing well and has close follow up with cardiology- see hypertension section below about spironolactone   # Emphysema -incidental finding on chest CT August 2020 and subsequently-largely asymptomatic but long-term smoker-encouraged for cessation once again  #hypertension S: medication: Nebivolol 5 mg, spironolactone 25 mg- some nipple tenderness -prior Amlodipine 5 mg to allow spironolactone with cardiomyopathy BP Readings from Last 3 Encounters:  08/02/23 120/78  11/23/22 130/88  11/10/22 120/80  A/P: blood pressure is controlled but some nipple tenderness on spironolactone and when he finishes his current medication he is going to message me as we suspect this is related to medicine- we will switch to eplerenone at that time- asked him to give me at least 2 weeks before he runs out to make the change   #hyperlipidemia S: Medication:Atorvastatin 20 mg daily- discussed risk of diabetes  -Incidental finding on chest ct- 2. Coronary artery atherosclerosis. Lab Results  Component Value Date   CHOL 167 04/01/2022   HDL 55.30  04/01/2022   LDLCALC 73 03/31/2021   LDLDIRECT 83.0 04/01/2022   TRIG 232.0 (H) 04/01/2022   CHOLHDL 3 04/01/2022   A/P: close to ideal goals last time- update lipid panel today- prefer LDL under 70   # Hyperglycemia/insulin resistance/prediabetes- cbg 106 fasting in past S:  Medication: none Lab Results  Component Value Date   HGBA1C 6.7 (H) 04/01/2022  A/P: update a1c- if still above 6.5 will need to diagnose as diabetes but as long as under 7 for a1c will not need medicine  #Gout S:  no flares since stablized after starting allopurinol- several flares at first with adjustment Lab Results  Component Value Date   LABURIC 5.8 11/23/2022  A/P: controlled and update uric acid   #nephrolithiasis- follows with Dr. Berneice Heinrich and on potassium citrate as of 2023- reports still on    #fatty liver on ct chest- incidental finding- check LFTs with labs yearly   and also focus on mild weight loss and alcohol reduction- down to 8 a week - even further lower would help more Lab Results  Component Value Date   ALT 35 11/23/2022   AST 29 11/23/2022   ALKPHOS 95 11/23/2022   BILITOT 0.4 11/23/2022   Recommended follow up: Return in about 6 months (around 01/30/2024) for followup or sooner if needed.Schedule b4 you leave.  Lab/Order associations: fasting   ICD-10-CM   1. Preventative health care  Z00.00     2. Need for pneumococcal 20-valent conjugate vaccination  Z23 Pneumococcal conjugate vaccine 20-valent (Prevnar 20)    3. Hyperglycemia  R73.9 Hemoglobin A1c    4. Screening for diabetes mellitus  Z13.1 Hemoglobin A1c    5. Screening for prostate cancer  Z12.5     6. Tobacco abuse  Z72.0     7. Hyperlipidemia, unspecified hyperlipidemia type  E78.5 Comprehensive metabolic panel    CBC with Differential/Platelet    Lipid panel    8. Pulmonary emphysema, unspecified emphysema type (HCC) Chronic J43.9     9. 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 Cardiomyopathy, unspecified type (HCC) Chronic I42.9     11. Idiopathic chronic gout of multiple sites without tophus  M1A.16X0 Uric acid      No orders of  the defined types were placed in this encounter.   Return precautions advised.  Tana Conch, MD

## 2023-08-02 NOTE — Addendum Note (Signed)
Addended by: Shelva Majestic on: 08/02/2023 12:06 PM   Modules accepted: Level of Service

## 2023-08-23 ENCOUNTER — Encounter (HOSPITAL_BASED_OUTPATIENT_CLINIC_OR_DEPARTMENT_OTHER): Payer: Self-pay | Admitting: *Deleted

## 2023-08-23 ENCOUNTER — Other Ambulatory Visit: Payer: Self-pay

## 2023-08-23 ENCOUNTER — Ambulatory Visit: Payer: Self-pay | Admitting: Family Medicine

## 2023-08-23 ENCOUNTER — Observation Stay (HOSPITAL_COMMUNITY): Payer: No Typology Code available for payment source

## 2023-08-23 ENCOUNTER — Inpatient Hospital Stay (HOSPITAL_BASED_OUTPATIENT_CLINIC_OR_DEPARTMENT_OTHER)
Admission: EM | Admit: 2023-08-23 | Discharge: 2023-08-25 | DRG: 378 | Disposition: A | Discharge status: Home / Self Care | Payer: No Typology Code available for payment source | Attending: Internal Medicine | Admitting: Internal Medicine

## 2023-08-23 DIAGNOSIS — K922 Gastrointestinal hemorrhage, unspecified: Secondary | ICD-10-CM | POA: Diagnosis present

## 2023-08-23 DIAGNOSIS — Z96611 Presence of right artificial shoulder joint: Secondary | ICD-10-CM | POA: Diagnosis present

## 2023-08-23 DIAGNOSIS — Z87442 Personal history of urinary calculi: Secondary | ICD-10-CM

## 2023-08-23 DIAGNOSIS — Z833 Family history of diabetes mellitus: Secondary | ICD-10-CM

## 2023-08-23 DIAGNOSIS — K315 Obstruction of duodenum: Secondary | ICD-10-CM | POA: Diagnosis not present

## 2023-08-23 DIAGNOSIS — K2981 Duodenitis with bleeding: Secondary | ICD-10-CM | POA: Diagnosis present

## 2023-08-23 DIAGNOSIS — D62 Acute posthemorrhagic anemia: Secondary | ICD-10-CM | POA: Diagnosis present

## 2023-08-23 DIAGNOSIS — I429 Cardiomyopathy, unspecified: Secondary | ICD-10-CM | POA: Diagnosis not present

## 2023-08-23 DIAGNOSIS — T39395A Adverse effect of other nonsteroidal anti-inflammatory drugs [NSAID], initial encounter: Secondary | ICD-10-CM | POA: Diagnosis not present

## 2023-08-23 DIAGNOSIS — K2971 Gastritis, unspecified, with bleeding: Secondary | ICD-10-CM | POA: Diagnosis not present

## 2023-08-23 DIAGNOSIS — Z83438 Family history of other disorder of lipoprotein metabolism and other lipidemia: Secondary | ICD-10-CM

## 2023-08-23 DIAGNOSIS — Y92009 Unspecified place in unspecified non-institutional (private) residence as the place of occurrence of the external cause: Secondary | ICD-10-CM

## 2023-08-23 DIAGNOSIS — F17210 Nicotine dependence, cigarettes, uncomplicated: Secondary | ICD-10-CM | POA: Diagnosis not present

## 2023-08-23 DIAGNOSIS — G4700 Insomnia, unspecified: Secondary | ICD-10-CM | POA: Diagnosis not present

## 2023-08-23 DIAGNOSIS — I10 Essential (primary) hypertension: Secondary | ICD-10-CM | POA: Diagnosis not present

## 2023-08-23 DIAGNOSIS — E785 Hyperlipidemia, unspecified: Secondary | ICD-10-CM | POA: Diagnosis not present

## 2023-08-23 DIAGNOSIS — R7303 Prediabetes: Secondary | ICD-10-CM | POA: Diagnosis not present

## 2023-08-23 DIAGNOSIS — K222 Esophageal obstruction: Secondary | ICD-10-CM | POA: Diagnosis present

## 2023-08-23 DIAGNOSIS — J439 Emphysema, unspecified: Secondary | ICD-10-CM | POA: Diagnosis not present

## 2023-08-23 DIAGNOSIS — R519 Headache, unspecified: Secondary | ICD-10-CM | POA: Diagnosis present

## 2023-08-23 DIAGNOSIS — K449 Diaphragmatic hernia without obstruction or gangrene: Secondary | ICD-10-CM | POA: Diagnosis present

## 2023-08-23 DIAGNOSIS — Z8249 Family history of ischemic heart disease and other diseases of the circulatory system: Secondary | ICD-10-CM

## 2023-08-23 DIAGNOSIS — J849 Interstitial pulmonary disease, unspecified: Secondary | ICD-10-CM | POA: Diagnosis not present

## 2023-08-23 DIAGNOSIS — Z79899 Other long term (current) drug therapy: Secondary | ICD-10-CM

## 2023-08-23 DIAGNOSIS — Z888 Allergy status to other drugs, medicaments and biological substances status: Secondary | ICD-10-CM

## 2023-08-23 DIAGNOSIS — K264 Chronic or unspecified duodenal ulcer with hemorrhage: Principal | ICD-10-CM | POA: Diagnosis present

## 2023-08-23 DIAGNOSIS — Z860101 Personal history of adenomatous and serrated colon polyps: Secondary | ICD-10-CM | POA: Diagnosis not present

## 2023-08-23 DIAGNOSIS — I7300 Raynaud's syndrome without gangrene: Secondary | ICD-10-CM | POA: Diagnosis present

## 2023-08-23 DIAGNOSIS — F17290 Nicotine dependence, other tobacco product, uncomplicated: Secondary | ICD-10-CM | POA: Diagnosis not present

## 2023-08-23 DIAGNOSIS — M1990 Unspecified osteoarthritis, unspecified site: Secondary | ICD-10-CM | POA: Diagnosis present

## 2023-08-23 DIAGNOSIS — Z98811 Dental restoration status: Secondary | ICD-10-CM

## 2023-08-23 LAB — CBC
HCT: 40.1 % (ref 39.0–52.0)
HCT: 36.6 % — ABNORMAL LOW (ref 39.0–52.0)
Hemoglobin: 13.3 g/dL (ref 13.0–17.0)
Hemoglobin: 11.9 g/dL — ABNORMAL LOW (ref 13.0–17.0)
MCH: 30.9 pg (ref 26.0–34.0)
MCH: 30.7 pg (ref 26.0–34.0)
MCHC: 33.2 g/dL (ref 30.0–36.0)
MCHC: 32.5 g/dL (ref 30.0–36.0)
MCV: 93 fL (ref 80.0–100.0)
MCV: 94.6 fL (ref 80.0–100.0)
Platelets: 243 10*3/uL (ref 150–400)
Platelets: 242 10*3/uL (ref 150–400)
RBC: 4.31 MIL/uL (ref 4.22–5.81)
RBC: 3.87 MIL/uL — ABNORMAL LOW (ref 4.22–5.81)
RDW: 13.6 % (ref 11.5–15.5)
RDW: 13.7 % (ref 11.5–15.5)
WBC: 10.3 10*3/uL (ref 4.0–10.5)
WBC: 12.4 10*3/uL — ABNORMAL HIGH (ref 4.0–10.5)
nRBC: 0 % (ref 0.0–0.2)
nRBC: 0 % (ref 0.0–0.2)

## 2023-08-23 LAB — COMPREHENSIVE METABOLIC PANEL
ALT: 29 U/L (ref 0–44)
AST: 21 U/L (ref 15–41)
Albumin: 4.4 g/dL (ref 3.5–5.0)
Alkaline Phosphatase: 51 U/L (ref 38–126)
Anion gap: 9 (ref 5–15)
BUN: 35 mg/dL — ABNORMAL HIGH (ref 8–23)
CO2: 25 mmol/L (ref 22–32)
Calcium: 9.8 mg/dL (ref 8.9–10.3)
Chloride: 104 mmol/L (ref 98–111)
Creatinine, Ser: 1 mg/dL (ref 0.61–1.24)
GFR, Estimated: >60 mL/min (ref >60)
Glucose, Bld: 126 mg/dL — ABNORMAL HIGH (ref 70–99)
Potassium: 4.2 mmol/L (ref 3.5–5.1)
Sodium: 138 mmol/L (ref 135–145)
Total Bilirubin: 0.4 mg/dL (ref 0.0–1.2)
Total Protein: 7.2 g/dL (ref 6.5–8.1)

## 2023-08-23 LAB — TYPE AND SCREEN
ABO/RH(D): A POS
Antibody Screen: NEGATIVE

## 2023-08-23 LAB — PROTIME-INR
INR: 1 (ref 0.8–1.2)
Prothrombin Time: 13.1 s (ref 11.4–15.2)

## 2023-08-23 LAB — APTT: aPTT: 27 s (ref 24–36)

## 2023-08-23 LAB — HIV ANTIBODY (ROUTINE TESTING W REFLEX): HIV Screen 4th Generation wRfx: NONREACTIVE

## 2023-08-23 LAB — OCCULT BLOOD X 1 CARD TO LAB, STOOL: Fecal Occult Bld: POSITIVE — AB

## 2023-08-23 MED ORDER — ACETAMINOPHEN 325 MG PO TABS: 650.0000 mg | ORAL_TABLET | Freq: Four times a day (QID) | ORAL | Status: DC | PRN

## 2023-08-23 MED ORDER — SODIUM CHLORIDE 0.9% FLUSH
3.0000 mL | Freq: Two times a day (BID) | INTRAVENOUS | Status: DC
  Administered 2023-08-23 – 2023-08-24 (×2): 3 mL via INTRAVENOUS

## 2023-08-23 MED ORDER — DEXTROSE IN LACTATED RINGERS 5 % IV SOLN: INTRAVENOUS | Status: DC

## 2023-08-23 MED ORDER — ATORVASTATIN CALCIUM 20 MG PO TABS
20.0000 mg | ORAL_TABLET | Freq: Every day | ORAL | Status: DC
  Administered 2023-08-24: 20 mg via ORAL
  Filled 2023-08-23 (×2): qty 1

## 2023-08-23 MED ORDER — PANTOPRAZOLE SODIUM 40 MG IV SOLR
40.0000 mg | Freq: Two times a day (BID) | INTRAVENOUS | Status: DC
  Administered 2023-08-23 – 2023-08-25 (×4): 40 mg via INTRAVENOUS
  Filled 2023-08-23 (×4): qty 10

## 2023-08-23 MED ORDER — AMOXICILLIN 500 MG PO CAPS
500.0000 mg | ORAL_CAPSULE | Freq: Three times a day (TID) | ORAL | Status: AC
  Administered 2023-08-23 – 2023-08-24 (×4): 500 mg via ORAL
  Filled 2023-08-23 (×4): qty 1

## 2023-08-23 MED ORDER — NEBIVOLOL HCL 5 MG PO TABS
5.0000 mg | ORAL_TABLET | Freq: Every day | ORAL | Status: DC
  Administered 2023-08-24: 5 mg via ORAL
  Filled 2023-08-23 (×2): qty 1

## 2023-08-23 MED ORDER — ZOLPIDEM TARTRATE 5 MG PO TABS
5.0000 mg | ORAL_TABLET | Freq: Every evening | ORAL | Status: DC | PRN
  Administered 2023-08-24: 5 mg via ORAL
  Filled 2023-08-23: qty 1

## 2023-08-23 MED ORDER — ALLOPURINOL 300 MG PO TABS
300.0000 mg | ORAL_TABLET | Freq: Every day | ORAL | Status: DC
  Administered 2023-08-24: 300 mg via ORAL
  Filled 2023-08-23 (×2): qty 1

## 2023-08-23 MED ORDER — MELATONIN 3 MG PO TABS
3.0000 mg | ORAL_TABLET | Freq: Every day | ORAL | Status: DC
  Administered 2023-08-23: 3 mg via ORAL
  Filled 2023-08-23: qty 1

## 2023-08-23 MED ORDER — ACETAMINOPHEN 160 MG/5ML PO SOLN
650.0000 mg | ORAL | Status: AC
  Administered 2023-08-23: 650 mg via ORAL
  Filled 2023-08-23: qty 20.3

## 2023-08-23 MED ORDER — ACETAMINOPHEN 650 MG RE SUPP: 650.0000 mg | Freq: Four times a day (QID) | RECTAL | Status: DC | PRN

## 2023-08-23 MED ORDER — POLYETHYLENE GLYCOL 3350 17 G PO PACK: 17.0000 g | PACK | Freq: Every day | ORAL | Status: DC | PRN

## 2023-08-23 MED ORDER — ZOLPIDEM TARTRATE 5 MG PO TABS: 5.0000 mg | ORAL_TABLET | Freq: Every evening | ORAL | Status: DC | PRN

## 2023-08-23 MED ORDER — PANTOPRAZOLE SODIUM 40 MG IV SOLR
40.0000 mg | Freq: Once | INTRAVENOUS | Status: AC
  Administered 2023-08-23: 40 mg via INTRAVENOUS
  Filled 2023-08-23: qty 10

## 2023-08-23 MED ORDER — ACETAMINOPHEN 160 MG/5ML PO SOLN
650.0000 mg | ORAL | Status: DC | PRN
  Administered 2023-08-24 (×2): 650 mg via ORAL
  Filled 2023-08-23 (×2): qty 20.3

## 2023-08-23 NOTE — ED Triage Notes (Signed)
Pt is here due 4 episodes of black stool since last pm.  Pt denies any abdominal pain, some weakness when getting up.  No blood thinners.

## 2023-08-23 NOTE — Assessment & Plan Note (Signed)
 Continue with atorvastatin

## 2023-08-23 NOTE — ED Notes (Signed)
Attempt to call report.  Floor RN unavailable.  States will call back when ready

## 2023-08-23 NOTE — ED Provider Notes (Signed)
[place] EMERGENCY DEPARTMENT AT [facility] Provider Note   CSN: [id number] Arrival date & time: 08/23/23  1106     History  Chief Complaint  Patient presents with   Melena    [name] [name] is a 63 y.o. male.  Patient presents to the emergency department today for evaluation of dark stool.  Patient reports early satiety last evening with dinner.  Since that time he has had 4-5 bowel movements where he passed dark black to purpleish stool.  No red blood.  No abdominal pain.  Last night he felt a little dizzy with standing.  For about 4 days he was taking 800 mg of ibuprofen approximately every 6 hours for pain control after recent root canal.  He denies anticoagulation.  He does drink alcohol occasionally, last used over the weekend.  No anticoagulation.  No history of gastritis or peptic ulcer disease.  He is a smoker.  He contacted PCP this morning and was referred to the emergency department.      Home Medications Prior to Admission medications   Medication Sig Start Date End Date Taking? Authorizing Provider  allopurinol (ZYLOPRIM) 300 MG tablet Take 300 mg by mouth daily. 09/15/22   [provider]  atorvastatin (LIPITOR) 20 MG tablet TAKE 1 TABLET BY MOUTH EVERY DAY 12/21/22   Shelva Majestic, MD  colchicine 0.6 MG tablet TAKE 2 TABS. 1 HOUR LATER ANOTHER 0.6 MG ON FIRST DAY THEN 0.6 MG TWICE DAILY UNTIL ATTACK RESOLVED 01/12/23   Shelva Majestic, MD  fluorouracil (EFUDEX) 5 % cream Apply 1 Application topically daily as needed. 09/28/22   [provider]  fluticasone (FLONASE) 50 MCG/ACT nasal spray Place 1 spray into both nostrils daily as needed for allergies or rhinitis.    [provider]  metroNIDAZOLE (METROCREAM) 0.75 % cream Apply 1 Application topically 2 (two) times daily. 09/28/22   [provider]  nebivolol (BYSTOLIC) 5 MG tablet TAKE 1 TABLET (5 MG TOTAL) BY MOUTH DAILY. 11/05/22   Duke Salvia, MD  sildenafil  (REVATIO) 20 MG tablet Take 20 mg by mouth as needed (ed).    [provider]  spironolactone (ALDACTONE) 25 MG tablet Take 1 tablet (25 mg total) by mouth daily. 11/12/22 02/10/23  Duke Salvia, MD      Allergies    Lisinopril    Review of Systems   Review of Systems  Physical Exam Updated Vital Signs BP (!) 137/96   Pulse 84   Temp 97.8 F (36.6 C) (Oral)   Resp 16   SpO2 100%  Physical Exam Vitals and nursing note reviewed.  Constitutional:      General: He is not in acute distress.    Appearance: He is well-developed.  HENT:     Head: Normocephalic and atraumatic.     Nose: Nose normal.     Mouth/Throat:     Mouth: Mucous membranes are moist.  Eyes:     General:        Right eye: No discharge.        Left eye: No discharge.     Conjunctiva/sclera: Conjunctivae normal.  Cardiovascular:     Rate and Rhythm: Normal rate and regular rhythm.     Heart sounds: Normal heart sounds.  Pulmonary:     Effort: Pulmonary effort is normal.     Breath sounds: Normal breath sounds.  Abdominal:     Palpations: Abdomen is soft.     Tenderness: There is no  abdominal tenderness. There is no guarding or rebound.     Comments: No epigastric tenderness  Musculoskeletal:     Cervical back: Normal range of motion and neck supple.  Skin:    General: Skin is warm and dry.  Neurological:     Mental Status: He is alert.    ED Results / Procedures / Treatments   Labs (all labs ordered are listed, but only abnormal results are displayed) Labs Reviewed  COMPREHENSIVE METABOLIC PANEL - Abnormal; Notable for the following components:      Result Value   Glucose, Bld 126 (*)    BUN 35 (*)    All other components within normal limits  OCCULT BLOOD X 1 CARD TO LAB, STOOL - Abnormal; Notable for the following components:   Fecal Occult Bld POSITIVE (*)    All other components within normal limits  CBC    EKG None  Radiology No results found.  Procedures Procedures     Medications Ordered in ED Medications  pantoprazole (PROTONIX) injection 40 mg (40 mg Intravenous Given 08/23/23 1212)    ED Course/ Medical Decision Making/ A&P    Patient seen and examined. History obtained directly from patient.   Labs/EKG: Ordered CBC, CMP, Hemoccult.  Imaging: None ordered  Medications/Fluids: Ordered: IV Protonix 40 mg.   Most recent vital signs reviewed and are as follows: BP (!) 137/96   Pulse 84   Temp 97.8 F (36.6 C) (Oral)   Resp 16   SpO2 100%   Initial impression: Likely upper GI bleeding leading to melena, recent heavy NSAID use and alcohol use contributing.  Abdominal exam is reassuring.  12:51 PM Reassessment performed. Patient appears stable.  DRE performed with RN chaperone.  Labs personally reviewed and interpreted including: CBC with hemoglobin 13.3, however decreased from 16.6 on 08/02/2023; CMP with glucose 126, BUN elevated consistent with upper GI bleed, creatinine normal; Hemoccult positive.  Reviewed pertinent lab work and imaging with patient at bedside. Questions answered.   Most current vital signs reviewed and are as follows: BP (!) 137/96   Pulse 84   Temp 97.8 F (36.6 C) (Oral)   Resp 16   SpO2 100%   Plan: Discussed with Dr. Karene Fry.  Discussed case with [facility]. Reccs admission for UGIB, likely upper endoscopy. Pt updated, in agreement.   1:30 PM Discussed case with Dr. Kirby Crigler who accepts for admission.                               Medical Decision Making Amount and/or Complexity of Data Reviewed Labs: ordered.  Risk Prescription drug management. Decision regarding hospitalization.   The following differentials were considered for this patient's suspected upper GI bleed: Esophagogastric varices: Often a history of alcohol abuse, cirrhosis, ascites, prior UGIB Peptic ulcer disease: Often a history of nonsteroidal anti-inflammatory drug (NSAID) use, Helicobacter pylori  infection Vascular anomalies: Arteriovenous malformation (AVM) by history, Angiodysplasia Mallory-Weiss tear: Longitudinal tear of the esophageal mucosa in setting of forceful vomiting Aortoenteric fistula: Often history of massive bleeding, history of aortic procedure Dieulafoy lesion (submucosal artery lesion) Malignancy  Discussed with GI.  Patient appropriate for monitoring inpatient, possible upper endoscopy.  He is stable.          Final Clinical Impression(s) / ED Diagnoses Final diagnoses:  Acute upper GI bleeding    Rx / DC Orders ED Discharge Orders     None  Renne Crigler, PA-C 08/23/23 1331    Ernie Avena, MD 08/23/23 1336

## 2023-08-23 NOTE — H&P (Signed)
History and Physical    Patient: [name] [name] QMV:[id number] DOB: June 16, 1961 DOA: 08/23/2023 DOS: the patient was seen and examined on 08/23/2023 PCP: Shelva Majestic, MD  Patient coming from: Home  Chief Complaint:  Chief Complaint  Patient presents with   Melena   HPI: [name] [name] is a 63 y.o. male with medical history significant of "root canal "done on February 12.  Patient has been using "maximal doses "of ibuprofen since then.  Patient was in his usual state of health till last evening when he reports abrupt onset of nausea and fullness and upper abdominal area distention.  Patient subsequently reports having had 3 maroon versus black-colored liquid stools overnight with the last one being at approximately 4:30 AM this morning.  Patient reports minimal lightheadedness this morning.  However since then patient has had no vomiting no diarrhea no abdominal pain no fever no lightheadedness no chest pain or shortness of breath.  Patient discussed above symptoms with his primary care office this morning and was directed to [facility] [place].  Workup revealed that the patient's hemoglobin was 13.3 which was in comparison to prior values all above 15 including the value of 16.6 about 3 weeks ago.  Patient has received IV fluids as well as pantoprazole and is transferred to [facility] inpatient unit.  At this time patient's principal concerns are a mild headache, insomnia as well as inability to take regular diet.  Medical  evaluation is sought.  Review of Systems: As mentioned in the history of present illness. All other systems reviewed and are negative. 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    on meds   History of adenomatous polyp of colon    05-27-2015   History of syncope 10/2020   pcp referred pt to cardiology, seen by dr hochrein, note 12-26-2020 in epic;  work-up includes event monitor 12-03-2020 showed 33% PVCs w/ occ nonsustained VT runs,  echo 12-04-2020 ef 55-55% freq PVCs GiDD,  ETT 12-27-2020 showed negative for ischemia   Hyperlipidemia    on meds   Hypertension    on meds   Idiopathic chronic gout of multiple sites without tophus    followed by pcp;   LBBB (left bundle branch block) 02/2021   Neuromuscular disorder (HCC)    Pre-diabetes    Pulmonary emphysema (HCC)    per pt pcp note 2022;   pt quit cigarettes 2014  but still smokes cigar , average 2 per month   PVC's (premature ventricular contractions)    08-14-2022 per pt asymptomatic   Raynaud's syndrome    Renal calculus, right 09/2021   Seasonal allergies    Testicular hypogonadism    Wears glasses    Past Surgical History:  Procedure Laterality Date   BICEPS TENDON REPAIR Right 2003   COLONOSCOPY  05/27/2015   DJ-MAC-suprep(exc)-TA x 2   CYSTOSCOPY WITH RETROGRADE PYELOGRAM, URETEROSCOPY AND STENT PLACEMENT Right 01/01/2021   Procedure: CYSTOSCOPY WITH RETROGRADE PYELOGRAM, URETEROSCOPY AND STENT PLACEMENT;  Surgeon: Sebastian Ache, MD;  Location: [facility];  Service: Urology;  Laterality: Right;  90 MINS   CYSTOSCOPY WITH RETROGRADE PYELOGRAM, URETEROSCOPY AND  STENT PLACEMENT Right 01/10/2021   Procedure: CYSTOSCOPY WITH RETROGRADE PYELOGRAM, URETEROSCOPY AND STENT EXCHANGE;  Surgeon: Sebastian Ache, MD;  Location: [facility];  Service: Urology;  Laterality: Right;   CYSTOSCOPY WITH RETROGRADE PYELOGRAM, URETEROSCOPY AND STENT PLACEMENT Right 08/21/2022   Procedure: CYSTOSCOPY WITH RETROGRADE PYELOGRAM, URETEROSCOPY AND STENT PLACEMENT stone basket extracrion;  Surgeon: Sebastian Ache, MD;  Location: [facility];   Service: Urology;  Laterality: Right;   CYSTOSCOPY WITH URETEROSCOPY, STONE BASKETRY AND STENT PLACEMENT Right 09/27/2015   Procedure: 2ND STAGE CYSTOSCOPY RIGHT URETEROSCOPY STENT REPLACEMENT;  Surgeon: Sebastian Ache, MD;  Location: [facility];  Service: Urology;  Laterality: Right;   CYSTOSCOPY/URETEROSCOPY/HOLMIUM LASER/STENT PLACEMENT Right 09/13/2015   Procedure: 1ST STAGE - CYSTOSCOPY/URETEROSCOPY/HOLMIUM LASER/STENT PLACEMENT;  Surgeon: Sebastian Ache, MD;  Location: [facility];  Service: Urology;  Laterality: Right;   HOLMIUM LASER APPLICATION Right 09/13/2015   Procedure: HOLMIUM LASER APPLICATION;  Surgeon: Sebastian Ache, MD;  Location: [facility];  Service: Urology;  Laterality: Right;   HOLMIUM LASER APPLICATION Right 09/27/2015   Procedure: HOLMIUM LASER;  Surgeon: Sebastian Ache, MD;  Location: [facility];  Service: Urology;  Laterality: Right;   HOLMIUM LASER APPLICATION Right 01/01/2021   Procedure: 1ST STAGE HOLMIUM LASER APPLICATION;  Surgeon: Sebastian Ache, MD;  Location: [facility];  Service: Urology;  Laterality: Right;   HOLMIUM LASER APPLICATION Right 01/10/2021   Procedure: HOLMIUM LASER APPLICATION;  Surgeon: Sebastian Ache, MD;  Location: [facility];  Service: Urology;  Laterality: Right;   HOLMIUM LASER APPLICATION Right 08/21/2022   Procedure: HOLMIUM LASER APPLICATION;  Surgeon: Sebastian Ache, MD;  Location: [facility];  Service: Urology;  Laterality: Right;   LITHOTRIPSY  2024   TOTAL SHOULDER ARTHROPLASTY Right 08/17/2013   RIGHT TOTAL SHOULDER ARTHROPLASTY;  Surgeon: Senaida Lange, MD;  Location: [facility];  Service: Orthopedics;  Laterality: Right;   WISDOM TOOTH EXTRACTION     no anesthesia   Social History:  reports that he has been smoking cigarettes and cigars. He has a 10 pack-year smoking history. He has never used smokeless tobacco. He  reports current alcohol use. He reports that he does not use drugs.  Allergies  Allergen Reactions   Lisinopril Cough    Family History  Problem Relation Age of Onset   Hyperlipidemia Mother        trig   Coronary artery disease Father        CABG 52, former smoker   Diabetes Father    Hypertension Father    Hypertension Sister        HLD   Colon cancer Neg Hx    Esophageal cancer Neg Hx    Rectal cancer Neg Hx    Stomach cancer Neg Hx    Prostate cancer Neg Hx    Colon polyps Neg Hx     Prior to Admission medications   Medication Sig Start Date End Date Taking? Authorizing Provider  allopurinol (ZYLOPRIM) 300 MG tablet Take 300 mg by mouth daily. 09/15/22  Yes [provider]  amoxicillin (AMOXIL) 500 MG tablet Take 500 mg by mouth 3 (three) times daily. 08/18/23  Yes [provider]  atorvastatin (LIPITOR) 20 MG tablet TAKE 1 TABLET BY MOUTH EVERY DAY 12/21/22  Yes Shelva Majestic, MD  nebivolol (BYSTOLIC) 5 MG tablet TAKE 1 TABLET (5 MG TOTAL) BY MOUTH DAILY. 11/05/22  Yes Duke Salvia, MD  Potassium Citrate  15 MEQ (1620 MG) TBCR Take 2 tablets by mouth 2 (two) times daily. 04/28/23  Yes [provider]  spironolactone (ALDACTONE) 25 MG tablet Take 1 tablet (25 mg total) by mouth daily. 11/12/22 08/23/23 Yes Duke Salvia, MD  colchicine 0.6 MG tablet TAKE 2 TABS. 1 HOUR LATER ANOTHER 0.6 MG ON FIRST DAY THEN 0.6 MG TWICE DAILY UNTIL ATTACK RESOLVED Patient not taking: Reported on 08/23/2023 01/12/23   Shelva Majestic, MD    Physical Exam: Vitals:   08/23/23 1600 08/23/23 1630 08/23/23 1700 08/23/23 1809  BP: 135/81 131/87 128/85 125/86  Pulse: 71 72 80 89  Resp:  16 16 20   Temp:    (!) 97.5 F (36.4 C)  TempSrc:    Oral  SpO2: 100% 99% 100% 100%   General: Patient is alert and awake, gives a fully coherent account of his symptoms.  Appears to be in no distress Respiratory exam: Bilateral air entry vesicular Cardiovascular exam S1-S2  normal Abdomen bowel sounds are normal all quadrants are soft and nontender Extremities warm without edema. Data Reviewed:  Labs on Admission:  Results for orders placed or performed during the hospital encounter of 08/23/23 (from the past 24 hours)  Comprehensive metabolic panel     Status: Abnormal   Collection Time: 08/23/23 11:42 AM  Result Value Ref Range   Sodium 138 135 - 145 mmol/L   Potassium 4.2 3.5 - 5.1 mmol/L   Chloride 104 98 - 111 mmol/L   CO2 25 22 - 32 mmol/L   Glucose, Bld 126 (H) 70 - 99 mg/dL   BUN 35 (H) 8 - 23 mg/dL   Creatinine, Ser 1.47 0.61 - 1.24 mg/dL   Calcium 9.8 8.9 - 82.9 mg/dL   Total Protein 7.2 6.5 - 8.1 g/dL   Albumin 4.4 3.5 - 5.0 g/dL   AST 21 15 - 41 U/L   ALT 29 0 - 44 U/L   Alkaline Phosphatase 51 38 - 126 U/L   Total Bilirubin 0.4 0.0 - 1.2 mg/dL   GFR, Estimated >56 >21 mL/min   Anion gap 9 5 - 15  CBC     Status: None   Collection Time: 08/23/23 11:42 AM  Result Value Ref Range   WBC 10.3 4.0 - 10.5 K/uL   RBC 4.31 4.22 - 5.81 MIL/uL   Hemoglobin 13.3 13.0 - 17.0 g/dL   HCT 30.8 65.7 - 84.6 %   MCV 93.0 80.0 - 100.0 fL   MCH 30.9 26.0 - 34.0 pg   MCHC 33.2 30.0 - 36.0 g/dL   RDW 96.2 95.2 - 84.1 %   Platelets 243 150 - 400 K/uL   nRBC 0.0 0.0 - 0.2 %  Occult blood card to lab, stool     Status: Abnormal   Collection Time: 08/23/23 11:42 AM  Result Value Ref Range   Fecal Occult Bld POSITIVE (A) NEGATIVE  Type and screen  [facility]     Status: None   Collection Time: 08/23/23  6:58 PM  Result Value Ref Range   ABO/RH(D) A POS    Antibody Screen NEG    Sample Expiration      08/26/2023,2359 Performed at [facility], [address], [place], [place] [address]   CBC     Status: Abnormal   Collection Time: 08/23/23  6:58 PM  Result Value Ref Range   WBC 12.4 (H) 4.0 - 10.5 K/uL   RBC 3.87 (L) 4.22 - 5.81 MIL/uL  Hemoglobin 11.9 (L) 13.0 - 17.0 g/dL   HCT 13.2 (L) 44.0 - 10.2 %    MCV 94.6 80.0 - 100.0 fL   MCH 30.7 26.0 - 34.0 pg   MCHC 32.5 30.0 - 36.0 g/dL   RDW 72.5 36.6 - 44.0 %   Platelets 242 150 - 400 K/uL   nRBC 0.0 0.0 - 0.2 %  Protime-INR     Status: None   Collection Time: 08/23/23  6:58 PM  Result Value Ref Range   Prothrombin Time 13.1 11.4 - 15.2 seconds   INR 1.0 0.8 - 1.2  APTT     Status: None   Collection Time: 08/23/23  6:58 PM  Result Value Ref Range   aPTT 27 24 - 36 seconds  HIV Antibody (routine testing w rflx)     Status: None   Collection Time: 08/23/23  6:58 PM  Result Value Ref Range   HIV Screen 4th Generation wRfx Non Reactive Non Reactive   Basic Metabolic Panel: Recent Labs  Lab 08/23/23 1142  NA 138  K 4.2  CL 104  CO2 25  GLUCOSE 126*  BUN 35*  CREATININE 1.00  CALCIUM 9.8   Liver Function Tests: Recent Labs  Lab 08/23/23 1142  AST 21  ALT 29  ALKPHOS 51  BILITOT 0.4  PROT 7.2  ALBUMIN 4.4   No results for input(s): "LIPASE", "AMYLASE" in the last 168 hours. No results for input(s): "AMMONIA" in the last 168 hours. CBC: Recent Labs  Lab 08/23/23 1142 08/23/23 1858  WBC 10.3 12.4*  HGB 13.3 11.9*  HCT 40.1 36.6*  MCV 93.0 94.6  PLT 243 242   Cardiac Enzymes: No results for input(s): "CKTOTAL", "CKMB", "CKMBINDEX", "TROPONINIHS" in the last 168 hours.  BNP (last 3 results) No results for input(s): "PROBNP" in the last 8760 hours. CBG: No results for input(s): "GLUCAP" in the last 168 hours.  Radiological Exams on Admission:  No results found.  chest X-ray    No intake/output data recorded. No intake/output data recorded.     Assessment and Plan: * Upper GI bleed Patient presented with 3 episodes of melena last episode approximately 4:30 AM this morning.  Hemoccult is positive in the lab.  Patient shows a hemoglobin drop of approximately 3 g/dL compared to his baseline.  Ordered on IV fluids, type and screen sent.  Empiric pantoprazole ordered.  [facility] has been consulted by the  ER provider, look forward to their evaluation in the morning.  Patient on clear liquid diet, n.p.o. past midnight discussed with patient. No suspicion of liver disease.  History of revision of root canal treatment of molar tooth Patient was prescribed amoxicillin by his dentist, 4 doses remaining.  Continue with same  Essential hypertension Patient on soft blood pressure.  Hold patient's spironolactone, continue with nebivolol starting tomorrow morning.  Gout This is chronic, continue with allopurinol.  Hyperlipidemia Continue with atorvastatin.    Baseline CXR ordred  Advance Care Planning:   Code Status: Full Code   Consults: GI as above. Eval pending.  Family Communication: per patient.  Severity of Illness: The appropriate patient status for this patient is INPATIENT. Inpatient status is judged to be reasonable and necessary in order to provide the required intensity of service to ensure the patient's safety. The patient's presenting symptoms, physical exam findings, and initial radiographic and laboratory data in the context of their chronic comorbidities is felt to place them at high risk for further clinical deterioration. Furthermore, it  is not anticipated that the patient will be medically stable for discharge from the hospital within 2 midnights of admission.   * I certify that at the point of admission it is my clinical judgment that the patient will require inpatient hospital care spanning beyond 2 midnights from the point of admission due to high intensity of service, high risk for further deterioration and high frequency of surveillance required.*  Author: Nolberto Hanlon, MD 08/23/2023 10:25 PM  For on call review [email or web address].

## 2023-08-23 NOTE — Telephone Encounter (Signed)
FYI for pt going to ED

## 2023-08-23 NOTE — Assessment & Plan Note (Signed)
This is chronic, continue with allopurinol.

## 2023-08-23 NOTE — ED Notes (Signed)
Called [facility] to transport to [facility] rm# [phone]

## 2023-08-23 NOTE — Telephone Encounter (Signed)
  Chief Complaint: rectal/GI bleeding Symptoms: loose black to dark purple colored stools, dizziness, sweating Frequency: since yesterday Pertinent Negatives: Patient denies abdominal pain, nausea, vomiting Disposition: [x] ED /[] Urgent Care (no appt availability in office) / [] Appointment(In office/virtual)/ []  [facility]/ [] Home Care/ [] Refused Recommended Disposition /[] [facility]/ []  Follow-up with PCP Additional Notes: Recent use of ibuprofen (800mg  every 6 hours) after his root canal last Wednesday. Patient denies known history of peptic ulcer disease or gastritis. Patient agreeable to go to ED.   Copied from CRM [id number]. Topic: Clinical - Red Word Triage >> Aug 23, 2023 10:02 AM Marica Otter wrote: [facility] that prompted transfer to Nurse Triage: Patient states he's had black and purple-ish stools all night. With a little dizzyness Reason for Disposition  Black or tarry bowel movements  (Exception: Chronic-unchanged black-grey BMs AND is taking iron pills or Pepto-Bismol.)  Answer Assessment - Initial Assessment Questions 1. APPEARANCE of BLOOD: "What color is it?" "Is it passed separately, on the surface of the stool, or mixed in with the stool?"      Most recent stool was purple liquid, states it started as black dark stool.  2. AMOUNT: "How much blood was passed?"      Whole bowel movement is black or purple.  3. FREQUENCY: "How many times has blood been passed with the stools?"      Patient states about 5 times. The first time was solid stool, then gradually became liquid.  4. ONSET: "When was the blood first seen in the stools?" (Days or weeks)      Late yesterday.  5. DIARRHEA: "Is there also some diarrhea?" If Yes, ask: "How many diarrhea stools in the past 24 hours?"      Denies.  6. CONSTIPATION: "Do you have constipation?" If Yes, ask: "How bad is it?"     Denies. States the only time he has had constipation is after anesthesia.  7. RECURRENT  SYMPTOMS: "Have you had blood in your stools before?" If Yes, ask: "When was the last time?" and "What happened that time?"      Denies.  8. BLOOD THINNERS: "Do you take any blood thinners?" (e.g., Coumadin/warfarin, Pradaxa/dabigatran, aspirin)     Denies.  9. OTHER SYMPTOMS: "Do you have any other symptoms?"  (e.g., abdomen pain, vomiting, dizziness, fever)     Dizziness when standing up at nighttime and felt like he was sweating.  Protocols used: Rectal Bleeding-A-AH

## 2023-08-23 NOTE — Assessment & Plan Note (Addendum)
Patient presented with 3 episodes of melena last episode approximately 4:30 AM this morning.  Hemoccult is positive in the lab.  Patient shows a hemoglobin drop of approximately 3 g/dL compared to his baseline.  Ordered on IV fluids, type and screen sent.  Empiric pantoprazole ordered.  [facility] has been consulted by the ER provider, look forward to their evaluation in the morning.  Patient on clear liquid diet, n.p.o. past midnight discussed with patient. No suspicion of liver disease.

## 2023-08-23 NOTE — Telephone Encounter (Signed)
With dizziness and sweating going along with this I think that is reasonable

## 2023-08-23 NOTE — Assessment & Plan Note (Signed)
Patient was prescribed amoxicillin by his dentist, 4 doses remaining.  Continue with same

## 2023-08-23 NOTE — Assessment & Plan Note (Signed)
Patient on soft blood pressure.  Hold patient's spironolactone, continue with nebivolol starting tomorrow morning.

## 2023-08-24 DIAGNOSIS — K449 Diaphragmatic hernia without obstruction or gangrene: Secondary | ICD-10-CM | POA: Diagnosis not present

## 2023-08-24 DIAGNOSIS — Z888 Allergy status to other drugs, medicaments and biological substances status: Secondary | ICD-10-CM | POA: Diagnosis not present

## 2023-08-24 DIAGNOSIS — K222 Esophageal obstruction: Secondary | ICD-10-CM | POA: Diagnosis present

## 2023-08-24 DIAGNOSIS — Z83438 Family history of other disorder of lipoprotein metabolism and other lipidemia: Secondary | ICD-10-CM | POA: Diagnosis not present

## 2023-08-24 DIAGNOSIS — Z833 Family history of diabetes mellitus: Secondary | ICD-10-CM | POA: Diagnosis not present

## 2023-08-24 DIAGNOSIS — K269 Duodenal ulcer, unspecified as acute or chronic, without hemorrhage or perforation: Secondary | ICD-10-CM | POA: Diagnosis not present

## 2023-08-24 DIAGNOSIS — G4700 Insomnia, unspecified: Secondary | ICD-10-CM | POA: Diagnosis present

## 2023-08-24 DIAGNOSIS — K264 Chronic or unspecified duodenal ulcer with hemorrhage: Secondary | ICD-10-CM | POA: Diagnosis present

## 2023-08-24 DIAGNOSIS — K2981 Duodenitis with bleeding: Secondary | ICD-10-CM | POA: Diagnosis present

## 2023-08-24 DIAGNOSIS — J849 Interstitial pulmonary disease, unspecified: Secondary | ICD-10-CM | POA: Diagnosis present

## 2023-08-24 DIAGNOSIS — F17290 Nicotine dependence, other tobacco product, uncomplicated: Secondary | ICD-10-CM | POA: Diagnosis present

## 2023-08-24 DIAGNOSIS — D62 Acute posthemorrhagic anemia: Secondary | ICD-10-CM | POA: Diagnosis present

## 2023-08-24 DIAGNOSIS — I429 Cardiomyopathy, unspecified: Secondary | ICD-10-CM | POA: Diagnosis present

## 2023-08-24 DIAGNOSIS — R519 Headache, unspecified: Secondary | ICD-10-CM | POA: Diagnosis present

## 2023-08-24 DIAGNOSIS — Z96611 Presence of right artificial shoulder joint: Secondary | ICD-10-CM | POA: Diagnosis present

## 2023-08-24 DIAGNOSIS — K3189 Other diseases of stomach and duodenum: Secondary | ICD-10-CM | POA: Diagnosis not present

## 2023-08-24 DIAGNOSIS — R7303 Prediabetes: Secondary | ICD-10-CM | POA: Diagnosis present

## 2023-08-24 DIAGNOSIS — K2971 Gastritis, unspecified, with bleeding: Secondary | ICD-10-CM | POA: Diagnosis present

## 2023-08-24 DIAGNOSIS — Y92009 Unspecified place in unspecified non-institutional (private) residence as the place of occurrence of the external cause: Secondary | ICD-10-CM | POA: Diagnosis not present

## 2023-08-24 DIAGNOSIS — Z860101 Personal history of adenomatous and serrated colon polyps: Secondary | ICD-10-CM | POA: Diagnosis not present

## 2023-08-24 DIAGNOSIS — Z79899 Other long term (current) drug therapy: Secondary | ICD-10-CM | POA: Diagnosis not present

## 2023-08-24 DIAGNOSIS — E785 Hyperlipidemia, unspecified: Secondary | ICD-10-CM | POA: Diagnosis present

## 2023-08-24 DIAGNOSIS — I10 Essential (primary) hypertension: Secondary | ICD-10-CM | POA: Diagnosis present

## 2023-08-24 DIAGNOSIS — K922 Gastrointestinal hemorrhage, unspecified: Secondary | ICD-10-CM | POA: Diagnosis present

## 2023-08-24 DIAGNOSIS — F17210 Nicotine dependence, cigarettes, uncomplicated: Secondary | ICD-10-CM | POA: Diagnosis present

## 2023-08-24 DIAGNOSIS — T39395A Adverse effect of other nonsteroidal anti-inflammatory drugs [NSAID], initial encounter: Secondary | ICD-10-CM | POA: Diagnosis present

## 2023-08-24 DIAGNOSIS — J439 Emphysema, unspecified: Secondary | ICD-10-CM | POA: Diagnosis present

## 2023-08-24 DIAGNOSIS — K315 Obstruction of duodenum: Secondary | ICD-10-CM | POA: Diagnosis present

## 2023-08-24 DIAGNOSIS — Z8249 Family history of ischemic heart disease and other diseases of the circulatory system: Secondary | ICD-10-CM | POA: Diagnosis not present

## 2023-08-24 DIAGNOSIS — K2970 Gastritis, unspecified, without bleeding: Secondary | ICD-10-CM | POA: Diagnosis not present

## 2023-08-24 LAB — HEMOGLOBIN AND HEMATOCRIT, BLOOD
HCT: 31.2 % — ABNORMAL LOW (ref 39.0–52.0)
HCT: 31.7 % — ABNORMAL LOW (ref 39.0–52.0)
Hemoglobin: 9.9 g/dL — ABNORMAL LOW (ref 13.0–17.0)
Hemoglobin: 9.9 g/dL — ABNORMAL LOW (ref 13.0–17.0)

## 2023-08-24 LAB — BASIC METABOLIC PANEL
Anion gap: 7 (ref 5–15)
BUN: 26 mg/dL — ABNORMAL HIGH (ref 8–23)
CO2: 23 mmol/L (ref 22–32)
Calcium: 8.3 mg/dL — ABNORMAL LOW (ref 8.9–10.3)
Chloride: 105 mmol/L (ref 98–111)
Creatinine, Ser: 1.02 mg/dL (ref 0.61–1.24)
GFR, Estimated: >60 mL/min (ref >60)
Glucose, Bld: 111 mg/dL — ABNORMAL HIGH (ref 70–99)
Potassium: 3.7 mmol/L (ref 3.5–5.1)
Sodium: 135 mmol/L (ref 135–145)

## 2023-08-24 LAB — CBC
HCT: 31.6 % — ABNORMAL LOW (ref 39.0–52.0)
Hemoglobin: 9.9 g/dL — ABNORMAL LOW (ref 13.0–17.0)
MCH: 30.5 pg (ref 26.0–34.0)
MCHC: 31.3 g/dL (ref 30.0–36.0)
MCV: 97.2 fL (ref 80.0–100.0)
Platelets: 190 10*3/uL (ref 150–400)
RBC: 3.25 MIL/uL — ABNORMAL LOW (ref 4.22–5.81)
RDW: 13.6 % (ref 11.5–15.5)
WBC: 9.5 10*3/uL (ref 4.0–10.5)
nRBC: 0 % (ref 0.0–0.2)

## 2023-08-24 MED ORDER — ALBUTEROL SULFATE (2.5 MG/3ML) 0.083% IN NEBU: 2.5000 mg | INHALATION_SOLUTION | RESPIRATORY_TRACT | Status: DC | PRN

## 2023-08-24 MED ORDER — DEXTROSE IN LACTATED RINGERS 5 % IV SOLN: INTRAVENOUS | Status: AC

## 2023-08-24 MED ORDER — TRAMADOL HCL 50 MG PO TABS: 50.0000 mg | ORAL_TABLET | Freq: Four times a day (QID) | ORAL | Status: DC | PRN

## 2023-08-24 MED ORDER — ONDANSETRON HCL 4 MG/2ML IJ SOLN
4.0000 mg | Freq: Four times a day (QID) | INTRAMUSCULAR | Status: DC | PRN
Start: 2023-08-24 — End: 2023-08-25

## 2023-08-24 NOTE — Progress Notes (Signed)
   08/24/23 1049  TOC Brief Assessment  Insurance and Status Reviewed  Patient has primary care physician Yes  Home environment has been reviewed Resides in single family home with spouse  Prior level of function: Independent with ADLs at baseline  Prior/Current Home Services No current home services  Social Drivers of Health Review SDOH reviewed no interventions necessary  Readmission risk has been reviewed Yes  Transition of care needs no transition of care needs at this time

## 2023-08-24 NOTE — Progress Notes (Signed)
PROGRESS NOTE    [name] [name]  ZOX:[id number] DOB: [birth year] DOA: 08/23/2023 PCP: Shelva Majestic, MD    Brief Narrative:   [name] [name] is a 63 y.o. male with past medical history significant for HTN, HLD, gout, COPD, history of colon polyps, cardiomyopathy, recent root canal in which she has been utilizing high doses of NSAIDs who presented to [facility] ED on 2/17 with dark stool.  Reports was in his usual state of health until last evening in which she reports abrupt onset nausea and fullness of his upper abdomen with distention.  Subsequently with 3 maroon versus black-colored liquid stools overnight.  Denies weakness/fatigue or lightheadedness.  No vomiting, no diarrhea, no abdominal pain, no fever, no chest pain, no shortness of breath.  Does not Dors significant use of ibuprofen since his root canal, denies any other antiplatelet/anticoagulant use.  In the ED, temperature 97.8 F, HR 84, RR 16, BP 137/96, SpO2 100% on room air.  WBC 10.3, hemoglobin 13.3, platelet count 243.  Sodium 138, potassium 4.2, chloride 104, CO2 25, glucose 126, BUN 26 creatinine 1.00.  FOBT positive.  Chest x-ray with coarsened interstitial prominence, reflecting chronic interstitial lung disease, no acute findings.  Gastroenterology was consulted.  TRH consulted for admission for further evaluation and management of acute upper GI bleed.  Assessment & Plan:   Acute upper GI bleed Patient to ED with several episodes of dark tarry/maroon-colored stool in the setting of NSAID use outpatient.  Denies any other antiplatelet/anticoagulant use.  Hemoglobin 13.3 on admission; recently was 16.63 weeks prior.  FOBT positive. -- [facility] following, appreciate assistance -- Hgb 13.3>11.9>9.9>9.9 -- Protonix 40 mg IV every 12 hours -- H&H every 8 hours -- Transfuse for hemoglobin less than 7.0 -- Clear liquid diet, n.p.o. after midnight for planned EGD on 2/19  Essential  hypertension -- Nebivolol 5 mg p.o. daily  Hyperlipidemia -- Atorvastatin 20 mg p.o. daily  COPD -- Albuterol neb every 4 hours as needed for wheezing/shortness of breath  Recent root canal -- Continue amoxicillin 500 mg p.o. every 8 hours -- Tylenol as needed mild pain -- Tramadol 50 mg p.o. every 6 hours as needed moderate pain -- Outpatient follow-up with dental medicine   DVT prophylaxis: SCDs Start: 08/23/23 1837    Code Status: Full Code Family Communication:   Disposition Plan:  Level of care: Progressive Status is: Observation The patient will require care spanning > 2 midnights and should be moved to inpatient because: Needs close monitoring of hemoglobin, IV Protonix, pending EGD tomorrow    Consultants:  [facility]  Procedures:  EGD pending  Antimicrobials:  Amoxicillin   Subjective: Patient seen examined bedside, lying in bed.  No complaints this morning.  Pending EGD tomorrow.  Hemoglobin has trended down to 9.9, stable with repeat 9.9 this afternoon.  Denies headache, no dizziness, no chest pain, no palpitations, no shortness of breath, no abdominal pain, no fever.  No acute events overnight per nursing staff.  Objective: Vitals:   08/23/23 1809 08/23/23 2257 08/24/23 0403 08/24/23 0900  BP: 125/86 127/77 114/69   Pulse: 89 73 67   Resp: 20 15 14    Temp: (!) 97.5 F (36.4 C) 98 F (36.7 C) 97.8 F (36.6 C)   TempSrc: Oral Oral Oral   SpO2: 100% 96% 97%   Weight:    83.2 kg  Height:    5' 9.5" (1.765 m)    Intake/Output Summary (Last 24 hours) at 08/24/2023 1337 Last data  filed at 08/24/2023 0600 Gross per 24 hour  Intake 1386.78 ml  Output --  Net 1386.78 ml   Filed Weights   08/24/23 0900  Weight: 83.2 kg    Examination:  Physical Exam: GEN: NAD, alert and oriented x 3, wd/wn HEENT: NCAT, PERRL, EOMI, sclera clear, MMM PULM: CTAB w/o wheezes/crackles, normal respiratory effort, on room air CV: RRR w/o M/G/R GI: abd  soft, NTND, NABS, no R/G/M MSK: no peripheral edema, muscle strength globally intact 5/5 bilateral upper/lower extremities NEURO: CN II-XII intact, no focal deficits, sensation to light touch intact PSYCH: normal mood/affect Integumentary: dry/intact, no rashes or wounds    Data Reviewed: I have personally reviewed following labs and imaging studies  CBC: Recent Labs  Lab 08/23/23 1142 08/23/23 1858 08/24/23 0359 08/24/23 1244  WBC 10.3 12.4* 9.5  --   HGB 13.3 11.9* 9.9* 9.9*  HCT 40.1 36.6* 31.6* 31.2*  MCV 93.0 94.6 97.2  --   PLT 243 242 190  --    Basic Metabolic Panel: Recent Labs  Lab 08/23/23 1142 08/24/23 0359  NA 138 135  K 4.2 3.7  CL 104 105  CO2 25 23  GLUCOSE 126* 111*  BUN 35* 26*  CREATININE 1.00 1.02  CALCIUM 9.8 8.3*   GFR: Estimated Creatinine Clearance: 77.3 mL/min (by C-G formula based on SCr of 1.02 mg/dL). Liver Function Tests: Recent Labs  Lab 08/23/23 1142  AST 21  ALT 29  ALKPHOS 51  BILITOT 0.4  PROT 7.2  ALBUMIN 4.4   No results for input(s): "LIPASE", "AMYLASE" in the last 168 hours. No results for input(s): "AMMONIA" in the last 168 hours. Coagulation Profile: Recent Labs  Lab 08/23/23 1858  INR 1.0   Cardiac Enzymes: No results for input(s): "CKTOTAL", "CKMB", "CKMBINDEX", "TROPONINI" in the last 168 hours. BNP (last 3 results) No results for input(s): "PROBNP" in the last 8760 hours. HbA1C: No results for input(s): "HGBA1C" in the last 72 hours. CBG: No results for input(s): "GLUCAP" in the last 168 hours. Lipid Profile: No results for input(s): "CHOL", "HDL", "LDLCALC", "TRIG", "CHOLHDL", "LDLDIRECT" in the last 72 hours. Thyroid Function Tests: No results for input(s): "TSH", "T4TOTAL", "FREET4", "T3FREE", "THYROIDAB" in the last 72 hours. Anemia Panel: No results for input(s): "VITAMINB12", "FOLATE", "FERRITIN", "TIBC", "IRON", "RETICCTPCT" in the last 72 hours. Sepsis Labs: No results for input(s):  "PROCALCITON", "LATICACIDVEN" in the last 168 hours.  No results found for this or any previous visit (from the past 240 hours).       Radiology Studies: DG CHEST PORT 1 VIEW Result Date: 08/23/2023 CLINICAL DATA:  Preop EXAM: PORTABLE CHEST 1 VIEW COMPARISON:  08/09/2013 FINDINGS: Heart and mediastinal contours are within normal limits. Interstitial prominence throughout the lungs may reflect chronic interstitial lung disease. No confluent opacities or effusions. No acute bony abnormality. IMPRESSION: Coarsened interstitial prominence may reflect chronic interstitial lung disease. No acute findings. Electronically Signed   By: Charlett Nose M.D.   On: 08/23/2023 23:09        Scheduled Meds:  allopurinol  300 mg Oral Daily   amoxicillin  500 mg Oral Q8H   atorvastatin  20 mg Oral Daily   melatonin  3 mg Oral QHS   nebivolol  5 mg Oral Daily   pantoprazole (PROTONIX) IV  40 mg Intravenous Q12H   sodium chloride flush  3 mL Intravenous Q12H   Continuous Infusions:  dextrose 5% lactated ringers 100 mL/hr at 08/24/23 0631     LOS: 0  days    Time spent: 52 minutes spent on chart review, discussion with nursing staff, consultants, updating family and interview/physical exam; more than 50% of that time was spent in counseling and/or coordination of care.    Alvira Philips [place], DO [facility] Available via Epic secure chat 7am-7pm After these hours, please refer to coverage provider listed on [email or web address] 08/24/2023, 1:37 PM

## 2023-08-24 NOTE — Consult Note (Addendum)
Consultation Note   Referring Provider:  [facility] PCP: Shelva Majestic, MD Primary Gastroenterologist:  Corliss Parish, MD  Reason for Consultation:  GI bleed DOA: 08/23/2023         Hospital Day: 2   ASSESSMENT    Brief Narrative:  63 y.o. year old male with adenomatous colon polyps, gout, COPD, HTN, cardiomyopathy, PVCs, Raynaud's.    Gastrointestinal bleeding, most likely upper source with maroon, Heme + stools in setting of NSAIDS  Acute blood loss anemia.  Hgb 9.9, down from baseline of ~ 16.   Principal Problem:   Upper GI bleed Active Problems:   Hyperlipidemia   Essential hypertension   History of revision of root canal treatment of molar tooth   PLAN:   --Keep NPO --Continue BID IV PPI --Monitor H/H --Schedule for EGD. The risks and benefits of EGD with possible biopsies were discussed with the patient who agrees to proceed. Hopefully procedure can be done this afternoon   HPI  Hersel was admitted  yesterday for a GI bleed. On Sunday evening he began passing maroon stools. He was lightheaded upon standing, otherwise okay. Denies abdominal pain or nausea / vomiting. For about 5 days prior to onset of bleeding he had been taking a large amount of Ibuprofen for a root canal. Generally doesn't take Ibuprofen very often. No prior history of GI bleeding. No chronic GI issues. Has very occasional reflux symptoms   ED evaluation:  Presenting hgb down a couple of grams from baseline but still WNL at 13.3. BUN elevated at 35. He was hemodynamically stable. Given IVF, PPI  Updated History:  Last night hgb declined to 11.9, today it is 9.9. BUN improved to 25. He had another maroon colored stool around 630 this am.     Previous GI Evaluations   May 2024 Surveillance colonoscopy  - Hemorrhoids found on perianal exam. - The examined portion of the ileum was normal. - Four, 1 to 4 mm polyps in the rectum, at the  recto-sigmoid colon and at the hepatic flexure, removed with a cold snare. Diverticulosis in the recto-sigmoid colon and in the sigmoid colon. - Normal mucosa in the entire examined colon otherwise. - Non-bleeding non-thrombosed external and internal hemorrhoids Surgical [P], colon, hepatic flexure, rectosigmoid, rectal, polyp (4) COLONIC MUCOSA WITH LYMPHOID AGGREGATES. NEGATIVE FOR DYSPLASIA. MULTIPLE DEEPER H&E LEVELS EVALUATED.   Recent Labs    08/23/23 1142 08/23/23 1858 08/24/23 0359  WBC 10.3 12.4* 9.5  HGB 13.3 11.9* 9.9*  HCT 40.1 36.6* 31.6*  PLT 243 242 190   Recent Labs    08/23/23 1142 08/24/23 0359  NA 138 135  K 4.2 3.7  CL 104 105  CO2 25 23  GLUCOSE 126* 111*  BUN 35* 26*  CREATININE 1.00 1.02  CALCIUM 9.8 8.3*   Recent Labs    08/23/23 1142  PROT 7.2  ALBUMIN 4.4  AST 21  ALT 29  ALKPHOS 51  BILITOT 0.4   No results for input(s): "HEPBSAG", "HCVAB", "HEPAIGM", "HEPBIGM" in the last 72 hours. 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    on meds   History of adenomatous polyp of colon    05-27-2015   History of syncope 10/2020   pcp referred pt to cardiology, seen by dr hochrein, note 12-26-2020 in epic;  work-up includes event monitor 12-03-2020 showed 33% PVCs w/ occ nonsustained VT runs,  echo 12-04-2020 ef 55-55% freq PVCs GiDD,  ETT 12-27-2020 showed negative for ischemia   Hyperlipidemia    on meds   Hypertension    on meds   Idiopathic chronic gout of multiple sites without tophus    followed by pcp;   LBBB (left bundle branch block) 02/2021   Neuromuscular disorder (HCC)     Pre-diabetes    Pulmonary emphysema (HCC)    per pt pcp note 2022;   pt quit cigarettes 2014  but still smokes cigar , average 2 per month   PVC's (premature ventricular contractions)    08-14-2022 per pt asymptomatic   Raynaud's syndrome    Renal calculus, right 09/2021   Seasonal allergies    Testicular hypogonadism    Wears glasses     Past Surgical History:  Procedure Laterality Date   BICEPS TENDON REPAIR Right 2003   COLONOSCOPY  05/27/2015   DJ-MAC-suprep(exc)-TA x 2   CYSTOSCOPY WITH RETROGRADE PYELOGRAM, URETEROSCOPY AND STENT PLACEMENT Right 01/01/2021   Procedure: CYSTOSCOPY WITH RETROGRADE PYELOGRAM, URETEROSCOPY AND STENT PLACEMENT;  Surgeon: Sebastian Ache, MD;  Location: [facility];  Service: Urology;  Laterality: Right;  90 MINS   CYSTOSCOPY WITH RETROGRADE PYELOGRAM, URETEROSCOPY AND STENT PLACEMENT Right 01/10/2021   Procedure: CYSTOSCOPY WITH RETROGRADE PYELOGRAM, URETEROSCOPY AND STENT EXCHANGE;  Surgeon: Sebastian Ache, MD;  Location: [facility];  Service: Urology;  Laterality: Right;   CYSTOSCOPY WITH RETROGRADE PYELOGRAM, URETEROSCOPY AND STENT PLACEMENT Right 08/21/2022   Procedure: CYSTOSCOPY WITH RETROGRADE PYELOGRAM, URETEROSCOPY AND STENT PLACEMENT stone basket extracrion;  Surgeon: Sebastian Ache, MD;  Location: [facility];  Service: Urology;  Laterality: Right;   CYSTOSCOPY WITH URETEROSCOPY, STONE BASKETRY AND STENT PLACEMENT Right 09/27/2015   Procedure: 2ND STAGE CYSTOSCOPY RIGHT URETEROSCOPY STENT REPLACEMENT;  Surgeon: Sebastian Ache, MD;  Location: [facility];  Service: Urology;  Laterality: Right;   CYSTOSCOPY/URETEROSCOPY/HOLMIUM LASER/STENT PLACEMENT Right 09/13/2015   Procedure: 1ST STAGE - CYSTOSCOPY/URETEROSCOPY/HOLMIUM LASER/STENT PLACEMENT;  Surgeon: Sebastian Ache, MD;  Location: [facility];  Service: Urology;  Laterality: Right;   HOLMIUM LASER APPLICATION Right  09/13/2015   Procedure: HOLMIUM LASER APPLICATION;  Surgeon: Sebastian Ache, MD;  Location: [facility];  Service: Urology;  Laterality: Right;   HOLMIUM LASER APPLICATION Right 09/27/2015   Procedure: HOLMIUM LASER;  Surgeon: Sebastian Ache, MD;  Location: [facility];  Service: Urology;  Laterality: Right;   HOLMIUM LASER APPLICATION Right 01/01/2021   Procedure: 1ST STAGE HOLMIUM LASER APPLICATION;  Surgeon: Sebastian Ache, MD;  Location: [facility];  Service: Urology;  Laterality: Right;   HOLMIUM LASER APPLICATION Right 01/10/2021   Procedure: HOLMIUM LASER APPLICATION;  Surgeon: Sebastian Ache, MD;  Location: [facility];  Service: Urology;  Laterality: Right;   HOLMIUM LASER APPLICATION Right 08/21/2022   Procedure: HOLMIUM LASER APPLICATION;  Surgeon: Sebastian Ache, MD;  Location: [facility];  Service: Urology;  Laterality: Right;   LITHOTRIPSY  2024   TOTAL SHOULDER ARTHROPLASTY Right 08/17/2013   RIGHT TOTAL SHOULDER ARTHROPLASTY;  Surgeon: Senaida Lange, MD;  Location: [facility];  Service: Orthopedics;  Laterality: Right;   WISDOM TOOTH EXTRACTION     no anesthesia    Family History  Problem Relation Age of Onset   Hyperlipidemia Mother        trig   Coronary artery disease Father        CABG 61, former smoker   Diabetes Father    Hypertension Father    Hypertension Sister        HLD   Colon cancer Neg Hx    Esophageal cancer Neg Hx    Rectal cancer Neg Hx    Stomach cancer Neg Hx    Prostate cancer Neg Hx    Colon polyps Neg Hx     Prior to Admission medications   Medication Sig Start Date End Date Taking? Authorizing Provider  allopurinol (ZYLOPRIM) 300 MG tablet Take 300 mg by mouth daily. 09/15/22  Yes [provider]  amoxicillin (AMOXIL) 500 MG tablet Take 500 mg by mouth 3 (three) times daily. 08/18/23  Yes [provider]  atorvastatin (LIPITOR) 20 MG tablet TAKE 1  TABLET BY MOUTH EVERY DAY 12/21/22  Yes Shelva Majestic, MD  nebivolol (BYSTOLIC) 5 MG tablet TAKE 1 TABLET (5 MG TOTAL) BY MOUTH DAILY. 11/05/22  Yes Duke Salvia, MD  Potassium Citrate 15 MEQ (1620 MG) TBCR Take 2 tablets by mouth 2 (two) times daily. 04/28/23  Yes [provider]  spironolactone (ALDACTONE) 25 MG tablet Take 1 tablet (25 mg total) by mouth daily. 11/12/22 08/23/23 Yes Duke Salvia, MD  colchicine 0.6 MG tablet TAKE 2 TABS. 1 HOUR LATER ANOTHER 0.6 MG ON FIRST DAY THEN 0.6 MG TWICE DAILY UNTIL ATTACK RESOLVED Patient not taking: Reported on 08/23/2023 01/12/23   Shelva Majestic, MD    Current Facility-Administered Medications  Medication Dose Route Frequency Provider Last Rate Last Admin   acetaminophen (TYLENOL) 160 MG/5ML solution 650 mg  650 mg Oral Q4H PRN Nolberto Hanlon, MD       allopurinol (ZYLOPRIM) tablet 300 mg  300 mg Oral Daily Nolberto Hanlon, MD       amoxicillin (AMOXIL) capsule 500 mg  500 mg Oral Q8H Nolberto Hanlon, MD   500 mg at 08/24/23 1610   atorvastatin (LIPITOR) tablet 20 mg  20 mg Oral Daily Nolberto Hanlon, MD       dextrose 5 % in lactated ringers infusion   Intravenous Continuous Nolberto Hanlon, MD 100 mL/hr at 08/24/23 0631 New Bag at 08/24/23 0631   melatonin tablet 3 mg  3 mg Oral QHS Nolberto Hanlon, MD   3 mg at 08/23/23 2338   nebivolol (BYSTOLIC) tablet 5 mg  5 mg Oral Daily Nolberto Hanlon, MD       pantoprazole (PROTONIX) injection 40 mg  40 mg Intravenous Conchita Paris, MD   40 mg at 08/23/23 2055   polyethylene glycol (MIRALAX / GLYCOLAX) packet 17 g  17 g Oral Daily PRN Nolberto Hanlon, MD       sodium chloride flush (NS) 0.9 % injection 3 mL  3 mL Intravenous Q12H Nolberto Hanlon, MD   3 mL at 08/23/23 2056   zolpidem (AMBIEN) tablet 5 mg  5 mg Oral QHS PRN Nolberto Hanlon, MD  Allergies as of 08/23/2023 - Review Complete 08/23/2023  Allergen Reaction Noted   Lisinopril Cough 05/30/2012    Social History   Socioeconomic History   Marital  status: Married    Spouse name: Not on file   Number of children: Not on file   Years of education: Not on file   Highest education level: Not on file  Occupational History   Not on file  Tobacco Use   Smoking status: Some Days    Current packs/day: 0.25    Average packs/day: 0.3 packs/day for 40.0 years (10.0 ttl pk-yrs)    Types: Cigarettes, Cigars   Smokeless tobacco: Never   Tobacco comments:    08-14-2022 quit cigarett's 2014 but currently 2 cigar per month and a pack per 2 weeks of cig  Vaping Use   Vaping status: Never Used  Substance and Sexual Activity   Alcohol use: Yes    Alcohol/week: 0.0 - 14.0 standard drinks of alcohol   Drug use: Never   Sexual activity: Not on file  Other Topics Concern   Not on file  Social History Narrative   Married. Son and daughter (15 and 39 in 2021).  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-[employer]       Hobbies: fishing on [employer], golf, gym, family and friends time   Social Drivers of Corporate investment banker Strain: Not on file  Food Insecurity: No Food Insecurity (08/23/2023)   Hunger Vital Sign    Worried About Running Out of Food in the Last Year: Never true    Ran Out of Food in the Last Year: Never true  Transportation Needs: No Transportation Needs (08/23/2023)   PRAPARE - Administrator, Civil Service (Medical): No    Lack of Transportation (Non-Medical): No  Physical Activity: Not on file  Stress: Not on file  Social Connections: Not on file  Intimate Partner Violence: Not At Risk (08/23/2023)   Humiliation, Afraid, Rape, and Kick questionnaire    Fear of Current or Ex-Partner: No    Emotionally Abused: No    Physically Abused: No    Sexually Abused: No     Code Status   Code Status: Full Code  Review of Systems: All systems reviewed and negative  except where noted in HPI.  Physical Exam: Vital signs in last 24 hours: Temp:  [97.5 F (36.4 C)-98.3 F (36.8 C)] 97.8 F (36.6 C) (02/18 0403) Pulse Rate:  [67-89] 67 (02/18 0403) Resp:  [14-20] 14 (02/18 0403) BP: (114-137)/(69-97) 114/69 (02/18 0403) SpO2:  [96 %-100 %] 97 % (02/18 0403) Last BM Date : 08/23/23  General:  Pleasant male in NAD Psych:  Cooperative. Normal mood and affect Eyes: Pupils equal Ears:  Normal auditory acuity Nose: No deformity, discharge or lesions Neck:  Supple, no masses felt Lungs:  Clear to auscultation.  Heart:  Regular rate, regular rhythm.  Abdomen:  Soft, nondistended, nontender, active bowel sounds, no masses felt Rectal :  Deferred Msk: Symmetrical without gross deformities.  Neurologic:  Alert, oriented, grossly normal neurologically Extremities : No edema Skin:  Intact without significant lesions.    Intake/Output from previous day: 02/17 0701 - 02/18 0700 In: 1386.8 [P.O.:480; I.V.:906.8] Out: -  Intake/Output this shift:  No intake/output data recorded.   Willette Cluster, NP-C   08/24/2023,  8:32 AM   Attending physician's note   I have taken history, reviewed the chart and examined the patient. I performed a substantive portion of this encounter, including complete performance of at least one of the key components, in conjunction with the APP. I agree with the Advanced Practitioner's note, impression and recommendations.   UGI bleed (likely) in setting of NSAIDs intake (after root canal Rx). Hb 16 (baseline) to 10. HD stable.  No further bleeding.  L colonic diverticulosis on surveillance colonoscopy 11/2022. Rpt 7 yrs d/t prev H/O adenomatous polyps (Dr. Meridee Score)  Assoc COPD, cardiomyopathy (EF 49% 08/2022), Raynaud's, HTN, gout.  Plan: -IV Protonix. -Hold nonsteroidals -Trend CBC.  -EGD in AM.  Could not get it done today d/t scheduling constraints. -Please call if there is any active bleeding or change in clinical  status until then.  Explained procedure, risks and benefits to pt in detail.   Edman Circle, MD [facility] [phone]

## 2023-08-24 NOTE — Plan of Care (Signed)
  Problem: Clinical Measurements: Goal: Diagnostic test results will improve Outcome: Progressing Goal: Respiratory complications will improve Outcome: Progressing Goal: Cardiovascular complication will be avoided Outcome: Progressing   

## 2023-08-24 NOTE — Progress Notes (Signed)
EGD will be done 2/19 at 1 pm. Ordered clear liquids today and NPO after MN

## 2023-08-25 ENCOUNTER — Inpatient Hospital Stay (HOSPITAL_COMMUNITY): Payer: No Typology Code available for payment source | Admitting: Anesthesiology

## 2023-08-25 ENCOUNTER — Encounter (HOSPITAL_COMMUNITY): Payer: Self-pay | Admitting: Internal Medicine

## 2023-08-25 ENCOUNTER — Encounter (HOSPITAL_COMMUNITY)
Admission: EM | Disposition: A | Discharge status: Home / Self Care | Payer: Self-pay | Source: Home / Self Care | Attending: Internal Medicine

## 2023-08-25 DIAGNOSIS — K449 Diaphragmatic hernia without obstruction or gangrene: Secondary | ICD-10-CM

## 2023-08-25 DIAGNOSIS — K222 Esophageal obstruction: Secondary | ICD-10-CM

## 2023-08-25 DIAGNOSIS — K269 Duodenal ulcer, unspecified as acute or chronic, without hemorrhage or perforation: Secondary | ICD-10-CM

## 2023-08-25 DIAGNOSIS — K2970 Gastritis, unspecified, without bleeding: Secondary | ICD-10-CM | POA: Diagnosis not present

## 2023-08-25 DIAGNOSIS — K3189 Other diseases of stomach and duodenum: Secondary | ICD-10-CM | POA: Diagnosis not present

## 2023-08-25 DIAGNOSIS — K315 Obstruction of duodenum: Secondary | ICD-10-CM

## 2023-08-25 DIAGNOSIS — K922 Gastrointestinal hemorrhage, unspecified: Secondary | ICD-10-CM | POA: Diagnosis not present

## 2023-08-25 HISTORY — PX: BIOPSY: SHX5522

## 2023-08-25 HISTORY — PX: ESOPHAGOGASTRODUODENOSCOPY (EGD) WITH PROPOFOL: SHX5813

## 2023-08-25 LAB — CBC
HCT: 30.2 % — ABNORMAL LOW (ref 39.0–52.0)
Hemoglobin: 9.6 g/dL — ABNORMAL LOW (ref 13.0–17.0)
MCH: 30.6 pg (ref 26.0–34.0)
MCHC: 31.8 g/dL (ref 30.0–36.0)
MCV: 96.2 fL (ref 80.0–100.0)
Platelets: 196 10*3/uL (ref 150–400)
RBC: 3.14 MIL/uL — ABNORMAL LOW (ref 4.22–5.81)
RDW: 13.8 % (ref 11.5–15.5)
WBC: 8.5 10*3/uL (ref 4.0–10.5)
nRBC: 0 % (ref 0.0–0.2)

## 2023-08-25 LAB — BASIC METABOLIC PANEL
Anion gap: 6 (ref 5–15)
BUN: 14 mg/dL (ref 8–23)
CO2: 23 mmol/L (ref 22–32)
Calcium: 8.4 mg/dL — ABNORMAL LOW (ref 8.9–10.3)
Chloride: 108 mmol/L (ref 98–111)
Creatinine, Ser: 0.98 mg/dL (ref 0.61–1.24)
GFR, Estimated: >60 mL/min (ref >60)
Glucose, Bld: 120 mg/dL — ABNORMAL HIGH (ref 70–99)
Potassium: 3.8 mmol/L (ref 3.5–5.1)
Sodium: 137 mmol/L (ref 135–145)

## 2023-08-25 SURGERY — ESOPHAGOGASTRODUODENOSCOPY (EGD) WITH PROPOFOL
Anesthesia: Monitor Anesthesia Care

## 2023-08-25 MED ORDER — LIDOCAINE 2% (20 MG/ML) 5 ML SYRINGE
INTRAMUSCULAR | Status: DC | PRN
  Administered 2023-08-25: 100 mg via INTRAVENOUS

## 2023-08-25 MED ORDER — MIDAZOLAM HCL 2 MG/2ML IJ SOLN
INTRAMUSCULAR | Status: AC
  Filled 2023-08-25: qty 2

## 2023-08-25 MED ORDER — PHENYLEPHRINE 80 MCG/ML (10ML) SYRINGE FOR IV PUSH (FOR BLOOD PRESSURE SUPPORT)
PREFILLED_SYRINGE | INTRAVENOUS | Status: DC | PRN
  Administered 2023-08-25: 160 ug via INTRAVENOUS

## 2023-08-25 MED ORDER — PROPOFOL 10 MG/ML IV BOLUS
INTRAVENOUS | Status: AC
  Filled 2023-08-25: qty 20

## 2023-08-25 MED ORDER — LACTATED RINGERS IV SOLN: INTRAVENOUS | Status: DC

## 2023-08-25 MED ORDER — PANTOPRAZOLE SODIUM 40 MG PO TBEC
40.0000 mg | DELAYED_RELEASE_TABLET | Freq: Every day | ORAL | 3 refills | Status: AC
Start: 2023-08-25 — End: 2024-08-24

## 2023-08-25 MED ORDER — PROPOFOL 500 MG/50ML IV EMUL
INTRAVENOUS | Status: DC | PRN
  Administered 2023-08-25: 125 ug/kg/min via INTRAVENOUS

## 2023-08-25 MED ORDER — FERROUS SULFATE 325 (65 FE) MG PO TBEC: 325.0000 mg | DELAYED_RELEASE_TABLET | Freq: Two times a day (BID) | ORAL | 3 refills | Status: DC

## 2023-08-25 MED ORDER — PROPOFOL 10 MG/ML IV BOLUS
INTRAVENOUS | Status: DC | PRN
  Administered 2023-08-25 (×2): 20 mg via INTRAVENOUS
  Administered 2023-08-25 (×2): 30 mg via INTRAVENOUS
  Administered 2023-08-25 (×3): 20 mg via INTRAVENOUS

## 2023-08-25 MED ORDER — OXYCODONE HCL 5 MG PO TABS: 5.0000 mg | ORAL_TABLET | Freq: Once | ORAL | Status: DC | PRN

## 2023-08-25 MED ORDER — HYDROMORPHONE HCL 1 MG/ML IJ SOLN: 0.2500 mg | INTRAMUSCULAR | Status: DC | PRN

## 2023-08-25 MED ORDER — OXYCODONE HCL 5 MG/5ML PO SOLN: 5.0000 mg | Freq: Once | ORAL | Status: DC | PRN

## 2023-08-25 MED ORDER — FENTANYL CITRATE (PF) 100 MCG/2ML IJ SOLN
INTRAMUSCULAR | Status: AC
  Filled 2023-08-25: qty 2

## 2023-08-25 MED ORDER — SODIUM CHLORIDE 0.9 % IV SOLN: 12.5000 mg | INTRAVENOUS | Status: DC | PRN

## 2023-08-25 SURGICAL SUPPLY — 14 items

## 2023-08-25 NOTE — Discharge Summary (Signed)
Physician Discharge Summary  [name] [name] GNF:[id number] DOB: [birth year] DOA: 08/23/2023  PCP: Shelva Majestic, MD  Admit date: 08/23/2023 Discharge date: 08/25/2023  Admitted From: Home Disposition: Home  Recommendations for Outpatient Follow-up:  Follow up with PCP in 1-2 weeks Follow-up with gastroenterology in 8 weeks Started on Protonix 40mg  PO daily and ferrous sulfate Please obtain CBC in 2-4 weeks.   Avoid aspirin, ibuprofen, naproxen or other NSAIDs Please follow up on the following pending results: Pathology from gastric biopsy  Home Health: No Equipment/Devices: None  Discharge Condition: Stable CODE STATUS: Full code Diet recommendation: Heart healthy diet  History of present illness:  [name] [name] is a 63 y.o. male with past medical history significant for HTN, HLD, gout, COPD, history of colon polyps, cardiomyopathy, recent root canal in which she has been utilizing high doses of NSAIDs who presented to [facility] ED on 2/17 with dark stool.  Reports was in his usual state of health until last evening in which she reports abrupt onset nausea and fullness of his upper abdomen with distention.  Subsequently with 3 maroon versus black-colored liquid stools overnight.  Denies weakness/fatigue or lightheadedness.  No vomiting, no diarrhea, no abdominal pain, no fever, no chest pain, no shortness of breath.  Does not Dors significant use of ibuprofen since his root canal, denies any other antiplatelet/anticoagulant use.   In the ED, temperature 97.8 F, HR 84, RR 16, BP 137/96, SpO2 100% on room air.  WBC 10.3, hemoglobin 13.3, platelet count 243.  Sodium 138, potassium 4.2, chloride 104, CO2 25, glucose 126, BUN 26 creatinine 1.00.  FOBT positive.  Chest x-ray with coarsened interstitial prominence, reflecting chronic interstitial lung disease, no acute findings.  Gastroenterology was consulted.  TRH consulted for admission for further evaluation and  management of acute upper GI bleed.  Hospital course:  Upper GI bleed secondary to duodenal ulcers Patient to ED with several episodes of dark tarry/maroon-colored stool in the setting of NSAID use outpatient. Denies any other antiplatelet/anticoagulant use. Hemoglobin 13.3 on admission; recently was 16.63 weeks prior. FOBT positive. EGD with findings of nonobstructing and mild Schatzki ring at GE junction, small hiatal hernia, gastritis, mild duodenitis, 2 nonbleeding cratered duodenal ulcers without stigmata of bleeding in the duodenum largest measuring 8 mm in dimension.  Underwent gastric biopsy.  GI recommends Protonix 40 mg p.o. daily, start ferrous sulfate 325 mg p.o. daily.  Repeat CBC 2-4 weeks, hemoglobin 9.6 at time of discharge.  Outpatient follow-up with GI 8-12 weeks.  Essential hypertension Nebivolol 5 mg p.o. daily   Hyperlipidemia Atorvastatin 20 mg p.o. daily   COPD Albuterol neb every 4 hours as needed for wheezing/shortness of breath   Recent root canal Continue amoxicillin 500 mg p.o. every 8 hours.  Tylenol/tramadol as needed for mild/moderate pain. Outpatient follow-up with dental medicine  Discharge Diagnoses:  Principal Problem:   Upper GI bleed Active Problems:   Hyperlipidemia   Essential hypertension   History of revision of root canal treatment of molar tooth    Discharge Instructions  Discharge Instructions     Call MD for:  difficulty breathing, headache or visual disturbances   Complete by: As directed    Call MD for:  extreme fatigue   Complete by: As directed    Call MD for:  persistant dizziness or light-headedness   Complete by: As directed    Call MD for:  persistant nausea and vomiting   Complete by: As directed    Call MD for:  severe uncontrolled pain   Complete by: As directed    Call MD for:  temperature >100.4   Complete by: As directed    Diet - low sodium heart healthy   Complete by: As directed    Increase activity slowly    Complete by: As directed       Allergies as of 08/25/2023       Reactions   Lisinopril Cough        Medication List     STOP taking these medications    colchicine 0.6 MG tablet       TAKE these medications    allopurinol 300 MG tablet Commonly known as: ZYLOPRIM Take 300 mg by mouth daily.   amoxicillin 500 MG tablet Commonly known as: AMOXIL Take 500 mg by mouth 3 (three) times daily.   atorvastatin 20 MG tablet Commonly known as: LIPITOR TAKE 1 TABLET BY MOUTH EVERY DAY   ferrous sulfate 325 (65 FE) MG EC tablet Take 1 tablet (325 mg total) by mouth 2 (two) times daily.   nebivolol 5 MG tablet Commonly known as: BYSTOLIC TAKE 1 TABLET (5 MG TOTAL) BY MOUTH DAILY.   pantoprazole 40 MG tablet Commonly known as: Protonix Take 1 tablet (40 mg total) by mouth daily.   Potassium Citrate 15 MEQ (1620 MG) Tbcr Take 2 tablets by mouth 2 (two) times daily.   spironolactone 25 MG tablet Commonly known as: ALDACTONE Take 1 tablet (25 mg total) by mouth daily.        Follow-up Information     Shelva Majestic, MD. Schedule an appointment as soon as possible for a visit in 2 week(s).   Specialty: Family Medicine Why: repeat CBC to recheck hemoglobin Contact information: [address] [place] [address] [phone]         [facility]. Schedule an appointment as soon as possible for a visit in 8 week(s).   Specialty: Gastroenterology Contact information: [address] [place] [address] [phone]               Allergies  Allergen Reactions   Lisinopril Cough    Consultations: [place] [facility]   Procedures/Studies: DG CHEST PORT 1 VIEW Result Date: 08/23/2023 CLINICAL DATA:  Preop EXAM: PORTABLE CHEST 1 VIEW COMPARISON:  08/09/2013 FINDINGS: Heart and mediastinal contours are within normal limits. Interstitial prominence throughout the lungs may reflect chronic  interstitial lung disease. No confluent opacities or effusions. No acute bony abnormality. IMPRESSION: Coarsened interstitial prominence may reflect chronic interstitial lung disease. No acute findings. Electronically Signed   By: Charlett Nose M.D.   On: 08/23/2023 23:09     Subjective: Patient seen examined bedside, lying in bed.  Spouse present.  Just completed EGD.  Significant findings of 2 duodenal ulcers with no stigmata of bleeding.  GI okay for discharge home, starting on Protonix 40 mg p.o. daily and ferrous sulfate.  Will need close outpatient follow-up PCP for repeat CBC 2-4 weeks, outpatient follow-up with GI 8-12 weeks.  No other specific questions, concerns or complaints at this time.  Denies headache, no dizziness, no chest pain, no palpitations, no shortness of breath, no abdominal pain, no fever/chills/night sweats, no nausea/vomiting/diarrhea, no focal weakness, no fatigue, no paresthesias.  No acute events overnight per nursing staff.  Discharge Exam: Vitals:   08/25/23 1420 08/25/23 1506  BP: 117/77 125/75  Pulse: 63 61  Resp: 15 18  Temp:  97.7 F (36.5 C)  SpO2: 98%  99%   Vitals:   08/25/23 1358 08/25/23 1410 08/25/23 1420 08/25/23 1506  BP: (!) 91/58 113/67 117/77 125/75  Pulse: 69 67 63 61  Resp: 16 14 15 18   Temp: 98.4 F (36.9 C)   97.7 F (36.5 C)  TempSrc: Temporal   Oral  SpO2: 98% 98% 98% 99%  Weight:      Height:        Physical Exam: GEN: NAD, alert and oriented x 3, wd/wn HEENT: NCAT, PERRL, EOMI, sclera clear, MMM PULM: CTAB w/o wheezes/crackles, normal respiratory effort, on room air CV: RRR w/o M/G/R GI: abd soft, NTND, NABS, no R/G/M MSK: no peripheral edema, muscle strength globally intact 5/5 bilateral upper/lower extremities NEURO: CN II-XII intact, no focal deficits, sensation to light touch intact PSYCH: normal mood/affect Integumentary: dry/intact, no rashes or wounds    The results of significant diagnostics from this  hospitalization (including imaging, microbiology, ancillary and laboratory) are listed below for reference.     Microbiology: No results found for this or any previous visit (from the past 240 hours).   Labs: BNP (last 3 results) No results for input(s): "BNP" in the last 8760 hours. Basic Metabolic Panel: Recent Labs  Lab 08/23/23 1142 08/24/23 0359 08/25/23 0414  NA 138 135 137  K 4.2 3.7 3.8  CL 104 105 108  CO2 25 23 23   GLUCOSE 126* 111* 120*  BUN 35* 26* 14  CREATININE 1.00 1.02 0.98  CALCIUM 9.8 8.3* 8.4*   Liver Function Tests: Recent Labs  Lab 08/23/23 1142  AST 21  ALT 29  ALKPHOS 51  BILITOT 0.4  PROT 7.2  ALBUMIN 4.4   No results for input(s): "LIPASE", "AMYLASE" in the last 168 hours. No results for input(s): "AMMONIA" in the last 168 hours. CBC: Recent Labs  Lab 08/23/23 1142 08/23/23 1858 08/24/23 0359 08/24/23 1244 08/24/23 2043 08/25/23 0414  WBC 10.3 12.4* 9.5  --   --  8.5  HGB 13.3 11.9* 9.9* 9.9* 9.9* 9.6*  HCT 40.1 36.6* 31.6* 31.2* 31.7* 30.2*  MCV 93.0 94.6 97.2  --   --  96.2  PLT 243 242 190  --   --  196   Cardiac Enzymes: No results for input(s): "CKTOTAL", "CKMB", "CKMBINDEX", "TROPONINI" in the last 168 hours. BNP: Invalid input(s): "POCBNP" CBG: No results for input(s): "GLUCAP" in the last 168 hours. D-Dimer No results for input(s): "DDIMER" in the last 72 hours. Hgb A1c No results for input(s): "HGBA1C" in the last 72 hours. Lipid Profile No results for input(s): "CHOL", "HDL", "LDLCALC", "TRIG", "CHOLHDL", "LDLDIRECT" in the last 72 hours. Thyroid function studies No results for input(s): "TSH", "T4TOTAL", "T3FREE", "THYROIDAB" in the last 72 hours.  Invalid input(s): "FREET3" Anemia work up No results for input(s): "VITAMINB12", "FOLATE", "FERRITIN", "TIBC", "IRON", "RETICCTPCT" in the last 72 hours. Urinalysis    Component Value Date/Time   BILIRUBINUR neg 03/08/2020 1635   PROTEINUR Positive (A) 03/08/2020  1635   UROBILINOGEN 0.2 03/08/2020 1635   NITRITE neg 03/08/2020 1635   LEUKOCYTESUR Negative 03/08/2020 1635   Sepsis Labs Recent Labs  Lab 08/23/23 1142 08/23/23 1858 08/24/23 0359 08/25/23 0414  WBC 10.3 12.4* 9.5 8.5   Microbiology No results found for this or any previous visit (from the past 240 hours).   Time coordinating discharge: Over 30 minutes  SIGNED:   Alvira Philips [place], DO  [facility] 08/25/2023, 3:26 PM

## 2023-08-25 NOTE — Plan of Care (Signed)

## 2023-08-25 NOTE — Transfer of Care (Signed)
Immediate Anesthesia Transfer of Care Note  Patient: [name] [name]  Procedure(s) Performed: ESOPHAGOGASTRODUODENOSCOPY (EGD) WITH PROPOFOL BIOPSY  Patient Location: Endoscopy Unit  Anesthesia Type:MAC  Level of Consciousness: drowsy  Airway & Oxygen Therapy: Patient Spontanous Breathing and Patient connected to face mask oxygen  Post-op Assessment: Report given to RN and Post -op Vital signs reviewed and stable  Post vital signs: Reviewed and stable  Last Vitals:  Vitals Value Taken Time  BP    Temp    Pulse    Resp    SpO2      Last Pain:  Vitals:   08/25/23 1236  TempSrc: Temporal  PainSc: 0-No pain      Patients Stated Pain Goal: 3 (08/24/23 0956)  Complications: No notable events documented.

## 2023-08-25 NOTE — Anesthesia Preprocedure Evaluation (Signed)
Anesthesia Evaluation  Patient identified by MRN, date of birth, ID band Patient awake    Reviewed: Allergy & Precautions, NPO status , Patient's Chart, lab work & pertinent test results, reviewed documented beta blocker date and time   History of Anesthesia Complications Negative for: history of anesthetic complications  Airway Mallampati: II  TM Distance: >3 FB Neck ROM: Full    Dental no notable dental hx.    Pulmonary COPD, Current Smoker and Patient abstained from smoking.   Pulmonary exam normal        Cardiovascular hypertension, Pt. on medications and Pt. on home beta blockers Normal cardiovascular exam  TTE 2022: EF 50-55%, grade I DD, mild MR   Hx of catecholaminergic polymorphic ventricular tachycardia   Neuro/Psych negative neurological ROS     GI/Hepatic negative GI ROS, Neg liver ROS,,,  Endo/Other  negative endocrine ROS    Renal/GU Renal diseaseRECURRENT RENAL STONES  negative genitourinary   Musculoskeletal  (+) Arthritis ,    Abdominal   Peds  Hematology  (+) Blood dyscrasia, anemia   Anesthesia Other Findings Day of surgery medications reviewed with patient.  Reproductive/Obstetrics negative OB ROS                             Anesthesia Physical Anesthesia Plan  ASA: 2  Anesthesia Plan: MAC   Post-op Pain Management: Minimal or no pain anticipated   Induction: Intravenous  PONV Risk Score and Plan: 1 and Treatment may vary due to age or medical condition and Propofol infusion  Airway Management Planned: Nasal Cannula  Additional Equipment: None  Intra-op Plan:   Post-operative Plan:   Informed Consent: I have reviewed the patients History and Physical, chart, labs and discussed the procedure including the risks, benefits and alternatives for the proposed anesthesia with the patient or authorized representative who has indicated his/her understanding and  acceptance.     Dental advisory given  Plan Discussed with: CRNA  Anesthesia Plan Comments:        Anesthesia Quick Evaluation

## 2023-08-25 NOTE — Anesthesia Procedure Notes (Signed)
Date/Time: 08/25/2023 1:35 PM  Performed by: Florene Route, CRNAOxygen Delivery Method: Simple face mask

## 2023-08-25 NOTE — Op Note (Addendum)
[facility] Patient Name: [name] [name] Procedure Date: 08/25/2023 MRN: [id number] Attending MD: Lynann Bologna , MD, [id number] Date of Birth: July 28, 1960 CSN: [id number] Age: 63 Admit Type: Outpatient Procedure:                Upper GI endoscopy Indications:              Melena in pt on NSAIDs Providers:                Lynann Bologna, MD, Fransisca Connors, Rhodia Albright,                            Technician Referring MD:              Medicines:                Monitored Anesthesia Care Complications:            No immediate complications. Estimated Blood Loss:     Estimated blood loss: none. Procedure:                Pre-Anesthesia Assessment:                           - Prior to the procedure, a History and Physical                            was performed, and patient medications and                            allergies were reviewed. The patient's tolerance of                            previous anesthesia was also reviewed. The risks                            and benefits of the procedure and the sedation                            options and risks were discussed with the patient.                            All questions were answered, and informed consent                            was obtained. Prior Anticoagulants: The patient has                            taken no anticoagulant or antiplatelet agents. ASA                            Grade Assessment: II - A patient with mild systemic                            disease. After reviewing the risks and benefits,  the patient was deemed in satisfactory condition to                            undergo the procedure.                           After obtaining informed consent, the endoscope was                            passed under direct vision. Throughout the                            procedure, the patient's blood pressure, pulse, and                            oxygen saturations were  monitored continuously. The                            [id number]-[id number] ([id number]) Olympus endoscope was introduced                            through the mouth, and advanced to the second part                            of duodenum. The upper GI endoscopy was                            accomplished without difficulty. The patient                            tolerated the procedure well. Scope In: Scope Out: Findings:      The examined esophagus was normal.      A non-obstructing and mild Schatzki ring was found at the       gastroesophageal junction.      A small hiatal hernia was present.      Localized mild inflammation characterized by congestion (edema) and       erythema was found in the gastric antrum. Biopsies were taken with a       cold forceps for histology. Mild dudenitis.      Two non-bleeding cratered duodenal ulcers with no stigmata of bleeding       were found in the duodenum D1/D2 junction with edema/stenosis. The scope       could easily passed beyond. The largest lesion was 8 mm in largest       dimension. Impression:               - Duodenal ulcers with mild duodenal stenosis. No                            active bleeding.                           - Small hiatal hernia.                           - Gastritis. Biopsied. Moderate Sedation:  Not Applicable - Patient had care per Anesthesia. Recommendation:           - Patient has a contact number available for                            emergencies. The signs and symptoms of potential                            delayed complications were discussed with the                            patient. Return to normal activities tomorrow.                            Written discharge instructions were provided to the                            patient.                           - Resume previous diet.                           - Continue present medications including Protonix                            40 mg p.o. daily #90, 4RF.                            - No aspirin, ibuprofen, naproxen, or other                            non-steroidal anti-inflammatory drugs.                           - Await pathology results.                           - May benefit from iron supplements. Will turn                            stool dark.                           - Recheck CBC in 4 weeks                           - FU GI in 8-12 weeks.                           - The findings and recommendations were discussed                            with the patient's family. Procedure Code(s):        --- Professional ---  29562, Esophagogastroduodenoscopy, flexible,                            transoral; with biopsy, single or multiple Diagnosis Code(s):        --- Professional ---                           K22.2, Esophageal obstruction                           K44.9, Diaphragmatic hernia without obstruction or                            gangrene                           K29.70, Gastritis, unspecified, without bleeding                           K26.9, Duodenal ulcer, unspecified as acute or                            chronic, without hemorrhage or perforation                           K92.1, Melena (includes Hematochezia) CPT copyright 2022 American Medical Association. All rights reserved. The codes documented in this report are preliminary and upon coder review may  be revised to meet current compliance requirements. Lynann Bologna, MD 08/25/2023 2:02:05 PM This report has been signed electronically. Number of Addenda: 0

## 2023-08-25 NOTE — Interval H&P Note (Signed)
History and Physical Interval Note:  08/25/2023 12:34 PM  [name] [name]  has presented today for surgery, with the diagnosis of upper gastrointestinal bleeding.  The various methods of treatment have been discussed with the patient and family. After consideration of risks, benefits and other options for treatment, the patient has consented to  Procedure(s): ESOPHAGOGASTRODUODENOSCOPY (EGD) WITH PROPOFOL (N/A) as a surgical intervention.  The patient's history has been reviewed, patient examined, no change in status, stable for surgery.  I have reviewed the patient's chart and labs.  Questions were answered to the patient's satisfaction.     Lynann Bologna

## 2023-08-25 NOTE — H&P (View-Only) (Signed)
For EGD today.  Feels okay.  No BMs / GI blood loss in 24 hours  VSS.  Hgb 9.6 - stable overnight.  Has been NPO On BID IV PPI

## 2023-08-25 NOTE — Progress Notes (Signed)
For EGD today.  Feels okay.  No BMs / GI blood loss in 24 hours  VSS.  Hgb 9.6 - stable overnight.  Has been NPO On BID IV PPI

## 2023-08-25 NOTE — Progress Notes (Signed)
PROGRESS NOTE    [name] [name]  UVO:[id number] DOB: 04/20/1961 DOA: 08/23/2023 PCP: [name] Majestic, MD    Brief Narrative:   [name] [name] is a 63 y.o. male with past medical history significant for HTN, HLD, gout, COPD, history of colon polyps, cardiomyopathy, recent root canal in which she has been utilizing high doses of NSAIDs who presented to [facility] ED on 2/17 with dark stool.  Reports was in his usual state of health until last evening in which she reports abrupt onset nausea and fullness of his upper abdomen with distention.  Subsequently with 3 maroon versus black-colored liquid stools overnight.  Denies weakness/fatigue or lightheadedness.  No vomiting, no diarrhea, no abdominal pain, no fever, no chest pain, no shortness of breath.  Does not Dors significant use of ibuprofen since his root canal, denies any other antiplatelet/anticoagulant use.  In the ED, temperature 97.8 F, HR 84, RR 16, BP 137/96, SpO2 100% on room air.  WBC 10.3, hemoglobin 13.3, platelet count 243.  Sodium 138, potassium 4.2, chloride 104, CO2 25, glucose 126, BUN 26 creatinine 1.00.  FOBT positive.  Chest x-ray with coarsened interstitial prominence, reflecting chronic interstitial lung disease, no acute findings.  Gastroenterology was consulted.  TRH consulted for admission for further evaluation and management of acute upper GI bleed.  Assessment & Plan:   Acute upper GI bleed Patient to ED with several episodes of dark tarry/maroon-colored stool in the setting of NSAID use outpatient.  Denies any other antiplatelet/anticoagulant use.  Hemoglobin 13.3 on admission; recently was 16.63 weeks prior.  FOBT positive. -- [place] gastro[facility]ology following, appreciate assistance -- Hgb 13.3>11.9>9.9>9.9>9.9>9.6 -- Protonix 40 mg IV every 12 hours -- H&H every 8 hours -- Transfuse for hemoglobin less than 7.0 -- NPO for planned EGD today  Essential hypertension -- Nebivolol 5 mg p.o.  daily  Hyperlipidemia -- Atorvastatin 20 mg p.o. daily  COPD -- Albuterol neb every 4 hours as needed for wheezing/shortness of breath  Recent root canal -- Continue amoxicillin 500 mg p.o. every 8 hours -- Tylenol as needed mild pain -- Tramadol 50 mg p.o. every 6 hours as needed moderate pain -- Outpatient follow-up with dental medicine   DVT prophylaxis: SCDs Start: 08/23/23 1837    Code Status: Full Code Family Communication: No family present at bedside this morning  Disposition Plan:  Level of care: Progressive Status is: Inpatient Remains inpatient appropriate because: Pending EGD today      Consultants:  [place] gastro[facility]ology  Procedures:  EGD pending today  Antimicrobials:  Amoxicillin   Subjective: Patient seen examined bedside, lying in bed.  No complaints this morning.  EGD this afternoon.  Denies any further bowel movements.  Hemoglobin relatively stable, 9.6 this morning. Denies headache, no dizziness, no chest pain, no palpitations, no shortness of breath, no abdominal pain, no fever.  No acute events overnight per nursing staff.  Objective: Vitals:   08/24/23 1600 08/24/23 1700 08/24/23 2042 08/25/23 0512  BP:   130/85 118/76  Pulse:   71 64  Resp: 12 13 14 14   Temp:   (!) 97.5 F (36.4 C) 97.6 F (36.4 C)  TempSrc:   Oral Oral  SpO2:   100% 96%  Weight:      Height:        Intake/Output Summary (Last 24 hours) at 08/25/2023 1226 Last data filed at 08/25/2023 0600 Gross per 24 hour  Intake 1481.57 ml  Output --  Net 1481.57 ml   [employer]  08/24/23 0900  Weight: 83.2 kg    Examination:  Physical Exam: GEN: NAD, alert and oriented x 3, wd/wn HEENT: NCAT, PERRL, EOMI, sclera clear, MMM PULM: CTAB w/o wheezes/crackles, normal respiratory effort, on room air CV: RRR w/o M/G/R GI: abd soft, NTND, NABS, no R/G/M MSK: no peripheral edema, muscle strength globally intact 5/5 bilateral upper/lower extremities NEURO: CN II-XII  intact, no focal deficits, sensation to light touch intact PSYCH: normal mood/affect Integumentary: dry/intact, no rashes or wounds    Data Reviewed: I have personally reviewed following labs and imaging studies  CBC: Recent Labs  Lab 08/23/23 1142 08/23/23 1858 08/24/23 0359 08/24/23 1244 08/24/23 2043 08/25/23 0414  WBC 10.3 12.4* 9.5  --   --  8.5  HGB 13.3 11.9* 9.9* 9.9* 9.9* 9.6*  HCT 40.1 36.6* 31.6* 31.2* 31.7* 30.2*  MCV 93.0 94.6 97.2  --   --  96.2  PLT 243 242 190  --   --  196   Basic Metabolic Panel: Recent Labs  Lab 08/23/23 1142 08/24/23 0359 08/25/23 0414  NA 138 135 137  K 4.2 3.7 3.8  CL 104 105 108  CO2 25 23 23   GLUCOSE 126* 111* 120*  BUN 35* 26* 14  CREATININE 1.00 1.02 0.98  CALCIUM 9.8 8.3* 8.4*   GFR: Estimated Creatinine Clearance: 80.5 mL/min (by C-G formula based on SCr of 0.98 mg/dL). Liver Function Tests: Recent Labs  Lab 08/23/23 1142  AST 21  ALT 29  ALKPHOS 51  BILITOT 0.4  PROT 7.2  ALBUMIN 4.4   No results for input(s): "LIPASE", "AMYLASE" in the last 168 hours. No results for input(s): "AMMONIA" in the last 168 hours. Coagulation Profile: Recent Labs  Lab 08/23/23 1858  INR 1.0   Cardiac Enzymes: No results for input(s): "CKTOTAL", "CKMB", "CKMBINDEX", "TROPONINI" in the last 168 hours. BNP (last 3 results) No results for input(s): "PROBNP" in the last 8760 hours. HbA1C: No results for input(s): "HGBA1C" in the last 72 hours. CBG: No results for input(s): "GLUCAP" in the last 168 hours. Lipid Profile: No results for input(s): "CHOL", "HDL", "LDLCALC", "TRIG", "CHOLHDL", "LDLDIRECT" in the last 72 hours. Thyroid Function Tests: No results for input(s): "TSH", "T4TOTAL", "FREET4", "T3FREE", "THYROIDAB" in the last 72 hours. Anemia Panel: No results for input(s): "VITAMINB12", "FOLATE", "FERRITIN", "TIBC", "IRON", "RETICCTPCT" in the last 72 hours. Sepsis Labs: No results for input(s): "PROCALCITON",  "LATICACIDVEN" in the last 168 hours.  No results found for this or any previous visit (from the past 240 hours).       Radiology Studies: DG CHEST PORT 1 VIEW Result Date: 08/23/2023 CLINICAL DATA:  Preop EXAM: PORTABLE CHEST 1 VIEW COMPARISON:  08/09/2013 FINDINGS: Heart and mediastinal contours are within normal limits. Interstitial prominence throughout the lungs may reflect chronic interstitial lung disease. No confluent opacities or effusions. No acute bony abnormality. IMPRESSION: Coarsened interstitial prominence may reflect chronic interstitial lung disease. No acute findings. Electronically Signed   By: Charlett Nose M.D.   On: 08/23/2023 23:09        Scheduled Meds:  allopurinol  300 mg Oral Daily   atorvastatin  20 mg Oral Daily   melatonin  3 mg Oral QHS   nebivolol  5 mg Oral Daily   pantoprazole (PROTONIX) IV  40 mg Intravenous Q12H   sodium chloride flush  3 mL Intravenous Q12H   Continuous Infusions:  dextrose 5% lactated ringers 75 mL/hr at 08/24/23 2347     LOS: 1 day  Time spent: 52 minutes spent on chart review, discussion with nursing staff, consultants, updating family and interview/physical exam; more than 50% of that time was spent in counseling and/or coordination of care.    Alvira Philips [place], DO [facility] Available via Epic secure chat 7am-7pm After these hours, please refer to coverage provider listed on [email or web address] 08/25/2023, 12:26 PM

## 2023-08-26 ENCOUNTER — Telehealth: Payer: Self-pay

## 2023-08-26 LAB — SURGICAL PATHOLOGY

## 2023-08-26 NOTE — Transitions of Care (Post Inpatient/ED Visit) (Unsigned)
   08/26/2023  Name: [name] [name] MRN: [id number] DOB: 1960/10/22  Today's TOC FU Call Status: Today's TOC FU Call Status:: Unsuccessful Call (1st Attempt) Unsuccessful Call (1st Attempt) Date: 08/26/23  Attempted to reach the patient regarding the most recent Inpatient/ED visit.  Follow Up Plan: Additional outreach attempts will be made to reach the patient to complete the Transitions of Care (Post Inpatient/ED visit) call.   Signature Karena Addison, LPN [facility] Nurse Health Advisor Direct Dial [phone]

## 2023-08-26 NOTE — Anesthesia Postprocedure Evaluation (Signed)
Anesthesia Post Note  Patient: [name] [name]  Procedure(s) Performed: ESOPHAGOGASTRODUODENOSCOPY (EGD) WITH PROPOFOL BIOPSY     Patient location during evaluation: PACU Anesthesia Type: MAC Level of consciousness: awake and alert Pain management: pain level controlled Vital Signs Assessment: post-procedure vital signs reviewed and stable Respiratory status: spontaneous breathing, nonlabored ventilation and respiratory function stable Cardiovascular status: blood pressure returned to baseline and stable Postop Assessment: no apparent nausea or vomiting Anesthetic complications: no   No notable events documented.  Last Vitals:  Vitals:   08/25/23 1420 08/25/23 1506  BP: 117/77 125/75  Pulse: 63 61  Resp: 15 18  Temp:  36.5 C  SpO2: 98% 99%    Last Pain:  Vitals:   08/25/23 1506  TempSrc: Oral  PainSc:                  Lowella Curb

## 2023-08-27 ENCOUNTER — Encounter (HOSPITAL_COMMUNITY): Payer: Self-pay | Admitting: Gastroenterology

## 2023-08-27 ENCOUNTER — Encounter: Payer: Self-pay | Admitting: Gastroenterology

## 2023-08-27 NOTE — Transitions of Care (Post Inpatient/ED Visit) (Signed)
   08/27/2023  Name: [name] [name] MRN: [id number] DOB: 1961/04/12  Today's TOC FU Call Status: Today's TOC FU Call Status:: Successful TOC FU Call Completed Unsuccessful Call (1st Attempt) Date: 08/26/23 [facility] FU Call Complete Date: 08/27/23 Patient's Name and Date of Birth confirmed.  Transition Care Management Follow-up Telephone Call Date of Discharge: 08/25/23 Discharge Facility: [facility]) Type of Discharge: Inpatient Admission Primary Inpatient Discharge Diagnosis:: GI bleed How have you been since you were released from the hospital?: Better Any questions or concerns?: No  Items Reviewed: Did you receive and understand the discharge instructions provided?: Yes Medications obtained,verified, and reconciled?: Yes (Medications Reviewed) Any new allergies since your discharge?: No Dietary orders reviewed?: Yes Do you have support at home?: Yes People in Home: spouse  Medications Reviewed Today: Medications Reviewed Today     Reviewed by Karena Addison, LPN (Licensed Practical Nurse) on 08/27/23 at 1118  Med List Status: <None>   Medication Order Taking? Sig Documenting Provider Last Dose Status Informant  allopurinol (ZYLOPRIM) 300 MG tablet [id number] No Take 300 mg by mouth daily. [provider] 08/22/2023 Active Self  amoxicillin (AMOXIL) 500 MG tablet [id number] No Take 500 mg by mouth 3 (three) times daily. [provider] 08/23/2023 Morning Active Self           Med Note Antony Madura, Ladona Mow Aug 23, 2023 10:08 PM) The patient said he has 3-4 tablets remaining  atorvastatin (LIPITOR) 20 MG tablet [id number] No TAKE 1 TABLET BY MOUTH EVERY DAY Shelva Majestic, MD 08/22/2023 Active Self  ferrous sulfate 325 (65 FE) MG EC tablet [id number]  Take 1 tablet (325 mg total) by mouth 2 (two) times daily. [place], Alvira Philips, DO  Active   nebivolol (BYSTOLIC) 5 MG tablet [id number] No TAKE 1 TABLET (5 MG TOTAL) BY MOUTH DAILY. Duke Salvia, MD 08/22/2023  Active Self  pantoprazole (PROTONIX) 40 MG tablet [id number]  Take 1 tablet (40 mg total) by mouth daily. [place], Alvira Philips, DO  Active   Potassium Citrate 15 MEQ (1620 MG) TBCR [id number]  Take 2 tablets by mouth 2 (two) times daily. [provider]  Active Self  spironolactone (ALDACTONE) 25 MG tablet [id number] No Take 1 tablet (25 mg total) by mouth daily. Duke Salvia, MD 08/22/2023 Expired 08/23/23 2359 Self            Home Care and Equipment/Supplies: Were Home Health Services Ordered?: NA Any new equipment or medical supplies ordered?: NA  Functional Questionnaire: Do you need assistance with bathing/showering or dressing?: No Do you need assistance with meal preparation?: No Do you need assistance with eating?: No Do you have difficulty maintaining continence: No Do you need assistance with getting out of bed/getting out of a chair/moving?: No Do you have difficulty managing or taking your medications?: No  Follow up appointments reviewed: PCP Follow-up appointment confirmed?: No (no avail appt, sent message to staff to schedule) MD Provider Line [phone] Given: No Specialist Hospital Follow-up appointment confirmed?: NA Do you need transportation to your follow-up appointment?: No Do you understand care options if your condition(s) worsen?: Yes-patient verbalized understanding    SIGNATURE Karena Addison, LPN [facility] Nurse Health Advisor Direct Dial [phone]

## 2023-09-02 ENCOUNTER — Telehealth: Payer: Self-pay | Admitting: Family Medicine

## 2023-09-02 ENCOUNTER — Other Ambulatory Visit: Payer: Self-pay | Admitting: Internal Medicine

## 2023-09-02 NOTE — Telephone Encounter (Unsigned)
 Copied from CRM [id number]. Topic: General - Other >> Sep 02, 2023  2:32 PM Gurney Maxin H wrote: Reason for CRM: Reaching out to let provider know she is able to offer education and resources to member will be faxing information about services to provider just in case he needs to recommend and services, and patient was recently discharged from hospital 2/17-2/19 for GI bleed.

## 2023-09-02 NOTE — Telephone Encounter (Signed)
 Please contact pt for future appointment. Pt due for 6 month f/u.

## 2023-09-02 NOTE — Telephone Encounter (Signed)
 This is a Educational psychologist pt

## 2023-09-02 NOTE — Telephone Encounter (Signed)
 Noted.

## 2023-09-17 NOTE — Telephone Encounter (Signed)
Left message on voice mail to schedule

## 2023-10-01 ENCOUNTER — Encounter: Payer: Self-pay | Admitting: Family Medicine

## 2023-10-01 ENCOUNTER — Ambulatory Visit: Admitting: Family Medicine

## 2023-10-01 VITALS — BP 130/68 | HR 68 | Temp 97.6°F | Ht 69.5 in | Wt 183.6 lb

## 2023-10-01 DIAGNOSIS — Z8719 Personal history of other diseases of the digestive system: Secondary | ICD-10-CM | POA: Diagnosis not present

## 2023-10-01 DIAGNOSIS — E785 Hyperlipidemia, unspecified: Secondary | ICD-10-CM

## 2023-10-01 DIAGNOSIS — M1A09X0 Idiopathic chronic gout, multiple sites, without tophus (tophi): Secondary | ICD-10-CM

## 2023-10-01 DIAGNOSIS — E119 Type 2 diabetes mellitus without complications: Secondary | ICD-10-CM | POA: Diagnosis not present

## 2023-10-01 DIAGNOSIS — I10 Essential (primary) hypertension: Secondary | ICD-10-CM

## 2023-10-01 DIAGNOSIS — I429 Cardiomyopathy, unspecified: Secondary | ICD-10-CM

## 2023-10-01 LAB — COMPREHENSIVE METABOLIC PANEL WITH GFR
ALT: 24 U/L (ref 0–53)
AST: 25 U/L (ref 0–37)
Albumin: 4.6 g/dL (ref 3.5–5.2)
Alkaline Phosphatase: 65 U/L (ref 39–117)
BUN: 18 mg/dL (ref 6–23)
CO2: 26 meq/L (ref 19–32)
Calcium: 10.1 mg/dL (ref 8.4–10.5)
Chloride: 103 meq/L (ref 96–112)
Creatinine, Ser: 0.99 mg/dL (ref 0.40–1.50)
GFR: 82.42 mL/min (ref >60.00)
Glucose, Bld: 118 mg/dL — ABNORMAL HIGH (ref 70–99)
Potassium: 4.8 meq/L (ref 3.5–5.1)
Sodium: 139 meq/L (ref 135–145)
Total Bilirubin: 0.6 mg/dL (ref 0.2–1.2)
Total Protein: 6.9 g/dL (ref 6.0–8.3)

## 2023-10-01 LAB — IBC + FERRITIN
Ferritin: 34.6 ng/mL (ref 22.0–322.0)
Iron: 288 ug/dL — ABNORMAL HIGH (ref 42–165)
Saturation Ratios: 65.5 % — ABNORMAL HIGH (ref 20.0–50.0)
TIBC: 439.6 ug/dL (ref 250.0–450.0)
Transferrin: 314 mg/dL (ref 212.0–360.0)

## 2023-10-01 LAB — CBC WITH DIFFERENTIAL/PLATELET
Basophils Absolute: 0.1 10*3/uL (ref 0.0–0.1)
Basophils Relative: 1 % (ref 0.0–3.0)
Eosinophils Absolute: 0.4 10*3/uL (ref 0.0–0.7)
Eosinophils Relative: 4.7 % (ref 0.0–5.0)
HCT: 43.8 % (ref 39.0–52.0)
Hemoglobin: 14.1 g/dL (ref 13.0–17.0)
Lymphocytes Relative: 20.3 % (ref 12.0–46.0)
Lymphs Abs: 1.5 10*3/uL (ref 0.7–4.0)
MCHC: 32.3 g/dL (ref 30.0–36.0)
MCV: 95 fl (ref 78.0–100.0)
Monocytes Absolute: 0.6 10*3/uL (ref 0.1–1.0)
Monocytes Relative: 7.9 % (ref 3.0–12.0)
Neutro Abs: 5 10*3/uL (ref 1.4–7.7)
Neutrophils Relative %: 66.1 % (ref 43.0–77.0)
Platelets: 288 10*3/uL (ref 150.0–400.0)
RBC: 4.62 Mil/uL (ref 4.22–5.81)
RDW: 14.8 % (ref 11.5–15.5)
WBC: 7.6 10*3/uL (ref 4.0–10.5)

## 2023-10-01 LAB — MICROALBUMIN / CREATININE URINE RATIO
Creatinine,U: 124.2 mg/dL
Microalb Creat Ratio: UNDETERMINED mg/g (ref 0.0–30.0)
Microalb, Ur: <0.7 mg/dL

## 2023-10-01 NOTE — Assessment & Plan Note (Signed)
#   Diabetes-new diagnosis August 02, 2023 S: Medication:suspect diet controlled  CBGs- not checking Exercise and diet- has cut back on alcohol and sweets to try to help reduce sugar levels/control diabetes . Cut out his cold peanut m&multiple sclerosis. Cut out tea at lunch and no soft drinks since physical Lab Results  Component Value Date   HGBA1C 7.0 (H) 08/02/2023   HGBA1C 6.7 (H) 04/01/2022  A/P: new diagnosis but appears diet controlled- check albumin/creatinine ratio. Continue without medicine

## 2023-10-01 NOTE — Patient Instructions (Addendum)
 History of GI bleed.  Maintained on pantoprazole 40 mg without recurrence.  Continue to avoid NSAIDs.  Update CBC today and iron panel.  Encourage patient to call to schedule GI follow-up -let us know as soon as possible if any recurrence  [facility] contact Please call to schedule hospital follow-up- schedule 1-2 months out Address: [address] [place], [place], [place] [address] Phone: [phone]   Next eye exam let them know you have diabetes - they do a slightly different exam and they do recommended that annually  Please stop by lab before you go If you have mychart- we will send your results within 3 business days of [place] receiving them.  If you do not have mychart- we will call you about results within 5 business days of [place] receiving them.  *please also note that you will see labs on mychart as soon as they post. I will later go in and write notes on them- will say "notes from Dr. Durene Cal"   Recommended follow up: Return for next already scheduled visit or sooner if needed.

## 2023-10-01 NOTE — Progress Notes (Signed)
 Phone [phone] In person visit   Subjective:   [name] [name] is a 63 y.o. year old very pleasant male patient who presents for/with See problem oriented charting Chief Complaint  Patient presents with   hosp f/u    Pt here for hosp f/u due to upper GI bleed   Past Medical History-  Patient Active Problem List   Diagnosis Date Noted   H/O: upper GI bleed 10/01/2023    Priority: High   Diabetes mellitus without complication (HCC) 04/01/2022    Priority: High   Cardiomyopathy (HCC) 12/08/2021    Priority: High   Emphysema of lung (HCC) 03/08/2020    Priority: High   Tobacco abuse 08/13/2014    Priority: High   PVC's (premature ventricular contractions) 12/08/2021    Priority: Medium    Syncope 04/06/2021    Priority: Medium    Ventricular tachycardia (HCC) 04/06/2021    Priority: Medium    Hyperlipidemia 05/26/2010    Priority: Medium    Gout 05/26/2010    Priority: Medium    Essential hypertension 05/26/2010    Priority: Medium    History of adenomatous polyp of colon 06/03/2015    Priority: Low   Allergic rhinitis 08/13/2014    Priority: Low   S/P shoulder replacement 08/17/2013    Priority: Low   Acne rosacea 05/05/2013    Priority: Low   History of revision of root canal treatment of molar tooth 08/23/2023    Medications- reviewed and updated Current Outpatient Medications  Medication Sig Dispense Refill   allopurinol (ZYLOPRIM) 300 MG tablet Take 300 mg by mouth daily.     amoxicillin (AMOXIL) 500 MG tablet Take 500 mg by mouth 3 (three) times daily.     atorvastatin (LIPITOR) 20 MG tablet TAKE 1 TABLET BY MOUTH EVERY DAY 90 tablet 3   ferrous sulfate 325 (65 FE) MG EC tablet Take 1 tablet (325 mg total) by mouth 2 (two) times daily. 180 tablet 3   nebivolol (BYSTOLIC) 5 MG tablet TAKE 1 TABLET (5 MG TOTAL) BY MOUTH DAILY. 90 tablet 0   pantoprazole (PROTONIX) 40 MG tablet Take 1 tablet (40 mg total) by mouth daily. 90 tablet 3   Potassium Citrate  15 MEQ (1620 MG) TBCR Take 2 tablets by mouth 2 (two) times daily.     spironolactone (ALDACTONE) 25 MG tablet Take 1 tablet (25 mg total) by mouth daily. 90 tablet 0   No current facility-administered medications for this visit.     Objective:  BP 130/68   Pulse 68   Temp 97.6 F (36.4 C)   Ht 5' 9.5" (1.765 m)   Wt 183 lb 9.6 oz (83.3 kg)   SpO2 97%   BMI 26.72 kg/m  Gen: NAD, resting comfortably CV: RRR no murmurs rubs or gallops Lungs: CTAB no crackles, wheeze, rhonchi Abdomen: soft/nontender/nondistended/normal bowel sounds. No rebound or guarding.  Ext: no edema Skin: warm, dry     Assessment and Plan   # History of upper GI bleed S: patient was hospitalized mid February 2025 after presenting with dark stools/nausea/upper abdominal fullness and distention (had needed ibuprofen after recent root canal for 6-7 days even before the root canal) with hemoglobin dropping about 3 points on admission from last outpatient measures and positive fecal occult blood-EGD with findings of nonobstructing mild duodenal stenosis and mild Schatzki ring at GE junction, small hiatal hernia, gastritis, mild duodenitis, 2 nonbleeding cratered duodenal ulcers without stigmata of bleeding.  Gastric biopsy performed- no h  pylori and no signs of cancer. - GI recommended Protonix 40 mg daily as well as iron daily with repeat CBC in 4 weeks - Hemoglobin had trended down to 9.6 at discharge - Outpatient follow-up with GI in 8 to 12 weeks  Has not noted dark tarry stools- stools are darker on the iron in general. No abdominal pain. No further distension.  A/P: History of GI bleed.  Maintained on pantoprazole 40 mg without recurrence.  Continue to avoid NSAIDs.  Update CBC today and iron panel.  Encourage patient to call to schedule GI follow-up  # Diabetes-new diagnosis August 02, 2023 S: Medication:suspect diet controlled  CBGs- not checking Exercise and diet- has cut back on alcohol and sweets to  try to help reduce sugar levels/control diabetes . Cut out his cold peanut m&multiple sclerosis. Cut out tea at lunch and no soft drinks since physical Lab Results  Component Value Date   HGBA1C 7.0 (H) 08/02/2023   HGBA1C 6.7 (H) 04/01/2022  A/P: new diagnosis but appears diet controlled- check albumin/creatinine ratio. Continue without medicine  #history of Syncope- has seen cardiology due to PVCs and v tach followed by Dr. Graciela Husbands and on beta blockers (less fatigue on nebivolol 5 mg daily and has continued) -Raynauds noted- some consideration of changing to verapamil if needed -cardiomyopathy (on spironolactone)- followed by Dr. Antoine Poche- most recent cardiac MRI improved on 11/10/21 "Mild LV dysfunction and normal RV function.   Compared to prior study: LVEF has improved, LV size is now normal, decrease in ECV, LGE." -no recurrent syncopal issues. Cardiomyopathy seems controlled- continue current medications . Was having some orthostatic symptoms and shortness of breath right out of hospital but that has improved with time.   # Emphysema -incidental finding on chest CT August 2020-largely asymptomatic but long-term smoker-encouraged cessation- he is trying to cut back on his own.  Lung disease was noted on chest x-ray mid February 2025 but will be rechecked in summer on ct  #hypertension S: medication: Nebivolol 5 mg,  spironolactone 25 mg- some nipple tenderness -prior Amlodipine 5 mg to allow spironolactone with cardiomyopathy BP Readings from Last 3 Encounters:  10/01/23 130/68  08/25/23 125/75  08/02/23 120/78  A/P: blood pressure well controlled but we may still change in future to eplerenone   #hyperlipidemia S: Medication:Atorvastatin 20 mg -Incidental finding on chest ct- 2. Coronary artery atherosclerosis. Lab Results  Component Value Date   CHOL 198 08/02/2023   HDL 53.90 08/02/2023   LDLCALC 73 03/31/2021   LDLDIRECT 84.0 08/02/2023   TRIG (H) 08/02/2023    403.0  Triglyceride is over 400; calculations on Lipids are invalid.   CHOLHDL 4 08/02/2023   A/P: lipids close to ideal goal- triglyceride(s) high but suspect improving with reducing sugar in diet and better diabetes control   #Gout S: Medication: allopurinol 300mg  As needed:  avoid NSAIDs with gastrointestinal bleed history      - has colchicine if needed  Lab Results  Component Value Date   LABURIC 4.2 08/02/2023  A/P:uric acid at goal- continue current medications - colchicine helpful   #nephrolithiasis- follows with Dr. Berneice Heinrich and on potassium citrate - also checks his PSA it sound slike  #fatty liver on ct chest- incidental finding- check LFTs with labs yearly  at least  and also focus on mild weight loss and alcohol reduction - likely better with reducing alcohol and sugar intake- check LFTs today  Recommended follow up: Return for next already scheduled visit or sooner if needed.  Future Appointments  Date Time Provider Department Center  11/15/2023  9:45 AM Duke Salvia, MD [facility]  02/07/2024  8:20 AM Shelva Majestic, MD [facility]    Lab/Order associations:   ICD-10-CM   1. H/O: upper GI bleed  Z87.19 CBC with Differential/Platelet    Comprehensive metabolic panel with GFR    IBC + Ferritin    2. Diabetes mellitus without complication (HCC)  E11.9 Microalbumin / creatinine urine ratio    3. Essential hypertension  I10 CBC with Differential/Platelet    Comprehensive metabolic panel with GFR    4. Hyperlipidemia, unspecified hyperlipidemia type  E78.5     5. Idiopathic chronic gout of multiple sites without tophus  M1A.09X0     6. Cardiomyopathy, unspecified type (HCC)  I42.9      No orders of the defined types were placed in this encounter.  Return precautions advised.  Tana Conch, MD

## 2023-11-09 IMAGING — MR MR CARD MORPHOLOGY WO/W CM
45 of 48 series · 45 of 48 positions shown · IV contrast (Contrast agent)
Comparison: none

CLINICAL DATA: Clinical question of cardiomyopathy
Study assumes HCT of 45 and BSA of 2.02 m2.

EXAM:
CARDIAC MRI
TECHNIQUE: The patient was scanned on a 1.5 Tesla GE magnet. A dedicated
cardiac coil was used. Functional imaging was done using Fiesta
sequences. [DATE], and 4 chamber views were done to assess for RWMA's.
Modified Tiger rule using a short axis stack was used to
calculate an ejection fraction on a dedicated work station using
Circle software. The patient received 10 cc of Gadavist. After 10
minutes inversion recovery sequences were used to assess for
infiltration and scar tissue.
CONTRAST:  10 cc  of Gadavist

[Series 4: t2_haste_db_tra_bh · axial · 8.0mm · 1.41mm/px · 1 of 16 slices shown]
[im 1/16]
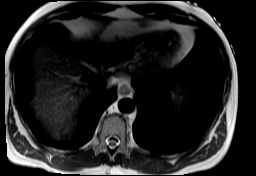

[Series 8: bSSFP · oblique · 8.0mm · 1.61mm/px · 1 of 25 slices shown (1 of 19)]
[im 1/25]
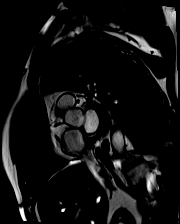

[Series 9: bSSFP · oblique · 8.0mm · 1.61mm/px · 1 of 25 slices shown (2 of 19)]
[im 1/25]
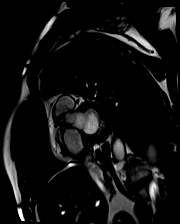

[Series 10: bSSFP · oblique · 8.0mm · 1.61mm/px · 1 of 25 slices shown (3 of 19)]
[im 1/25]
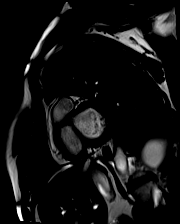

[Series 11: bSSFP · oblique · 8.0mm · 1.61mm/px · 1 of 25 slices shown (4 of 19)]
[im 1/25]
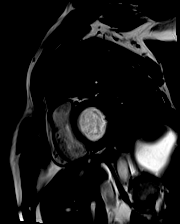

[Series 12: bSSFP · oblique · 8.0mm · 1.61mm/px · 1 of 25 slices shown (5 of 19)]
[im 1/25]
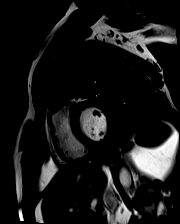

[Series 13: bSSFP · oblique · 8.0mm · 1.61mm/px · 1 of 25 slices shown (6 of 19)]
[im 1/25]
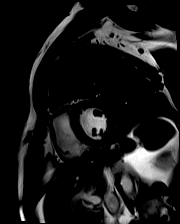

[Series 14: bSSFP · oblique · 8.0mm · 1.61mm/px · 1 of 25 slices shown (7 of 19)]
[im 1/25]
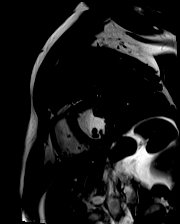

[Series 15: bSSFP · oblique · 8.0mm · 1.61mm/px · 1 of 25 slices shown (8 of 19)]
[im 1/25]
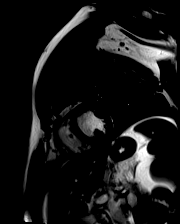

[Series 16: bSSFP · oblique · 8.0mm · 1.61mm/px · 1 of 25 slices shown (9 of 19)]
[im 1/25]
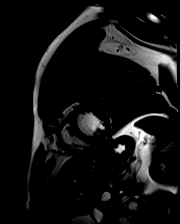

[Series 17: bSSFP · oblique · 8.0mm · 1.61mm/px · 1 of 25 slices shown (10 of 19)]
[im 1/25]
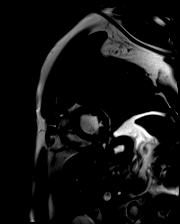

[Series 18: bSSFP · oblique · 8.0mm · 1.61mm/px · 1 of 25 slices shown (11 of 19)]
[im 1/25]
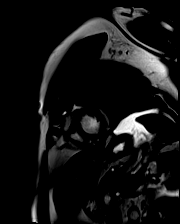

[Series 19: bSSFP · oblique · 8.0mm · 1.61mm/px · 1 of 25 slices shown (12 of 19)]
[im 1/25]
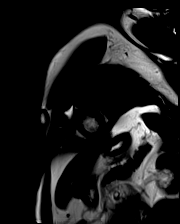

[Series 20: bSSFP · oblique · 8.0mm · 1.61mm/px · 1 of 25 slices shown (13 of 19)]
[im 1/25]
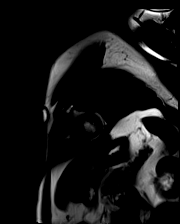

[Series 21: bSSFP · oblique · 8.0mm · 1.61mm/px · 1 of 25 slices shown (14 of 19)]
[im 1/25]
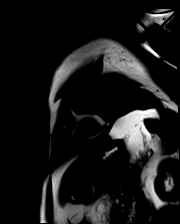

[Series 22: bSSFP · oblique · 8.0mm · 1.61mm/px · 1 of 25 slices shown (15 of 19)]
[im 1/25]
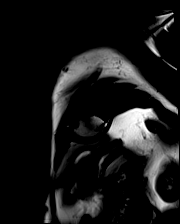

[Series 23: bSSFP · oblique · 8.0mm · 1.61mm/px · 1 of 25 slices shown (16 of 19)]
[im 1/25]
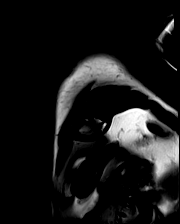

[Series 24: bSSFP · oblique · 6.0mm · 1.41mm/px · 1 of 25 slices shown (17 of 19)]
[im 1/25]
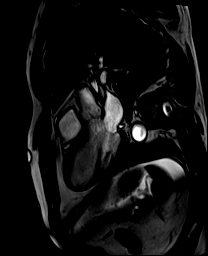

[Series 25: bSSFP · oblique · 6.0mm · 1.41mm/px · 1 of 25 slices shown (18 of 19)]
[im 1/25]
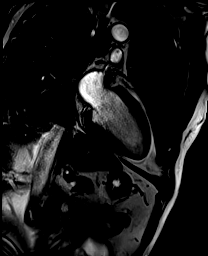

[Series 26: bSSFP · oblique · 6.0mm · 1.41mm/px · 1 of 25 slices shown (19 of 19)]
[im 1/25]
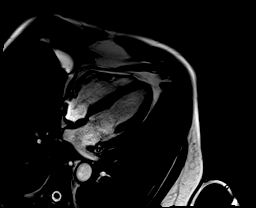

[Series 27: cine_trufi_short axis_cs_2_shot · oblique · 8.0mm · 1.48mm/px · 1 of 25 slices shown (1 of 17)]
[im 1/25]
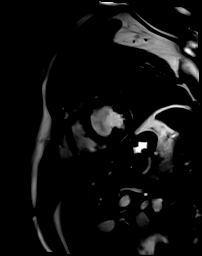

[Series 27: cine_trufi_short axis_cs_2_shot · oblique · 8.0mm · 1.48mm/px · 1 of 25 slices shown (2 of 17)]
[im 1/25]
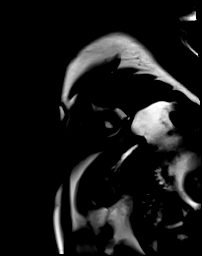

[Series 27: cine_trufi_short axis_cs_2_shot · oblique · 8.0mm · 1.48mm/px · 1 of 25 slices shown (3 of 17)]
[im 1/25]
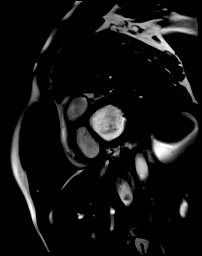

[Series 27: cine_trufi_short axis_cs_2_shot · oblique · 8.0mm · 1.48mm/px · 1 of 25 slices shown (4 of 17)]
[im 1/25]
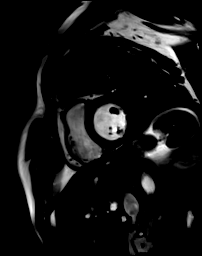

[Series 27: cine_trufi_short axis_cs_2_shot · oblique · 8.0mm · 1.48mm/px · 1 of 25 slices shown (5 of 17)]
[im 1/25]
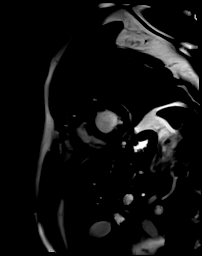

[Series 27: cine_trufi_short axis_cs_2_shot · oblique · 8.0mm · 1.48mm/px · 1 of 25 slices shown (6 of 17)]
[im 1/25]
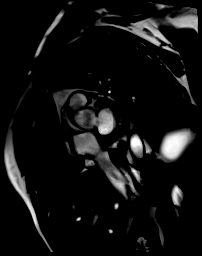

[Series 27: cine_trufi_short axis_cs_2_shot · oblique · 8.0mm · 1.48mm/px · 1 of 25 slices shown (7 of 17)]
[im 1/25]
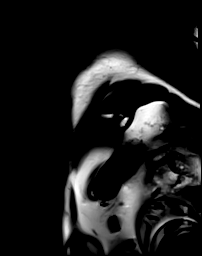

[Series 27: cine_trufi_short axis_cs_2_shot · oblique · 8.0mm · 1.48mm/px · 1 of 25 slices shown (8 of 17)]
[im 1/25]
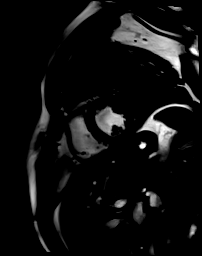

[Series 27: cine_trufi_short axis_cs_2_shot · oblique · 8.0mm · 1.48mm/px · 1 of 25 slices shown (9 of 17)]
[im 1/25]
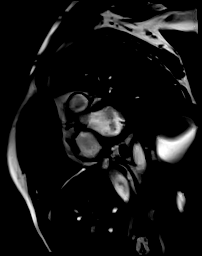

[Series 27: cine_trufi_short axis_cs_2_shot · oblique · 8.0mm · 1.48mm/px · 1 of 25 slices shown (10 of 17)]
[im 1/25]
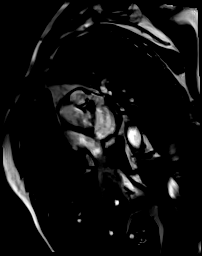

[Series 27: cine_trufi_short axis_cs_2_shot · oblique · 8.0mm · 1.48mm/px · 1 of 25 slices shown (11 of 17)]
[im 1/25]
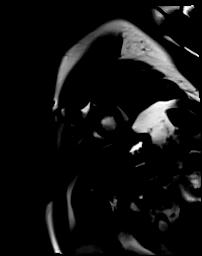

[Series 27: cine_trufi_short axis_cs_2_shot · oblique · 8.0mm · 1.48mm/px · 1 of 25 slices shown (12 of 17)]
[im 1/25]
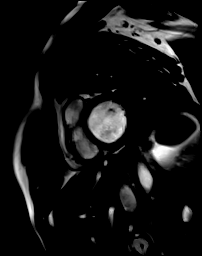

[Series 27: cine_trufi_short axis_cs_2_shot · oblique · 8.0mm · 1.48mm/px · 1 of 25 slices shown (13 of 17)]
[im 1/25]
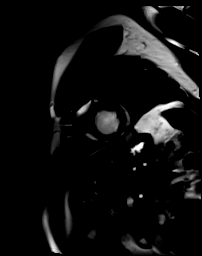

[Series 27: cine_trufi_short axis_cs_2_shot · oblique · 8.0mm · 1.48mm/px · 1 of 25 slices shown (14 of 17)]
[im 1/25]
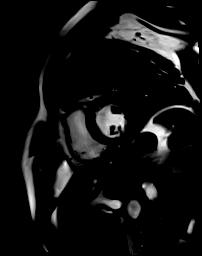

[Series 27: cine_trufi_short axis_cs_2_shot · oblique · 8.0mm · 1.48mm/px · 1 of 25 slices shown (15 of 17)]
[im 1/25]
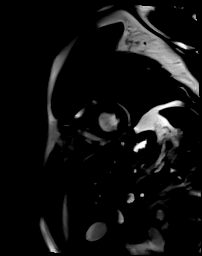

[Series 27: cine_trufi_short axis_cs_2_shot · oblique · 8.0mm · 1.48mm/px · 1 of 25 slices shown (16 of 17)]
[im 1/25]
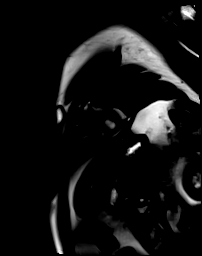

[Series 27: cine_trufi_short axis_cs_2_shot · oblique · 8.0mm · 1.48mm/px · 1 of 25 slices shown (17 of 17)]
[im 1/25]
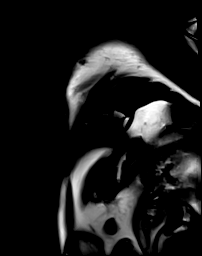

[Series 28: (id)_long_t1 · oblique · 8.0mm · 1.56mm/px · 1 of 24 slices shown]
[im 1/24]
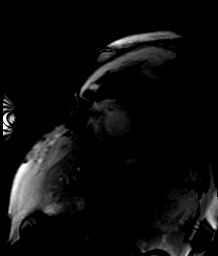

[Series 29: (id)_long_t1_moco · oblique · 8.0mm · 1.56mm/px · 1 of 24 slices shown]
[im 1/24]
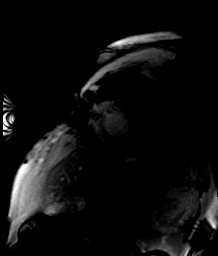

[Series 32: (id)_trufi · oblique · 8.0mm · 2.08mm/px · 1 of 9 slices shown]
[im 1/9]
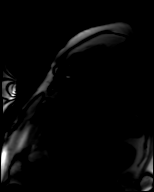

[Series 33: (id)_trufi_moco · oblique · 8.0mm · 2.08mm/px · 1 of 9 slices shown]
[im 1/9]
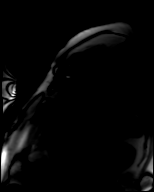

[Series 39: pre short axis · oblique · non-contrast · 8.0mm · 2.25mm/px · 1 of 10 slices shown (1 of 4)]
[im 1/10]
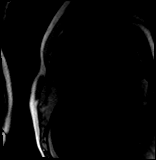

[Series 40: pre short axis · oblique · non-contrast · 8.0mm · 2.25mm/px · 1 of 10 slices shown (2 of 4)]
[im 1/10]
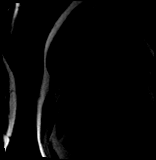

[Series 41: pre short axis · oblique · non-contrast · 8.0mm · 2.25mm/px · 1 of 10 slices shown (3 of 4)]
[im 1/10]
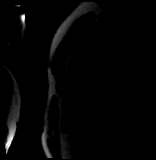

[Series 42: pre short axis · oblique · non-contrast · 8.0mm · 2.25mm/px · 1 of 10 slices shown (4 of 4)]
[im 1/10]
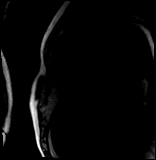

[45 of 48 positions shown; findings below may reference images not displayed]

FINDINGS: 1. Normal left ventricular size, with LVEDD 59 mm, and LVEDVi 85
mL/m2.

Normal left ventricular thickness, with intraventricular septal
thickness of 7 mm, posterior wall thickness of 8 mm, and myocardial
mass index of 61 g/m2.

Mildly decreased left ventricular systolic function (LVEF =49%).
Paradoxical septal motion suggestive of LBBB.

Left ventricular parametric mapping notable for normal T2.

Increase ECV in the base (inferior 38%, inferolateral 36%)

There is late gadolinium enhancement in the left ventricular
myocardium. There is a thin line of LGE at the basal septum, there
is small, patch LGE in the basal inferolateral. Total enhanced
volume represents 2.5 % of myocardium using a 6 standard deviation
assessment.

2. Normal right ventricular size with RVEDVI 69 mL/m2.

Normal right ventricular thickness.

Normal right ventricular systolic function (RVEF =51%). There are no
regional wall motion abnormalities or aneurysms.

3.  Normal left and right atrial size.

4. Normal size of the aortic root, ascending aorta and pulmonary
artery.

5. Valve assessment:

Aortic Valve: Tri-leaflet aortic valve. Qualitatively, there is no
significant regurgitation.

Pulmonic Valve: Qualitatively, there is no significant
regurgitation.

Tricuspid Valve: Qualitatively, there is no significant
regurgitation.

Mitral Valve: Mild mitral regurgitation. Posteromedial papillary
muscle has three heads with a common origin. Anterior leaflet
billowing without prolapse.

6.  Normal pericardium.  No pericardial effusion.

7. Grossly, no extracardiac findings. Recommended dedicated study if
concerned for non-cardiac pathology.
IMPRESSION: Mild LV dysfunction and normal RV function.

Compared to prior study: LVEF has improved, LV size is now normal,
decrease in ECV, LGE.

## 2023-11-12 ENCOUNTER — Encounter: Payer: Self-pay | Admitting: Physician Assistant

## 2023-11-12 ENCOUNTER — Ambulatory Visit (INDEPENDENT_AMBULATORY_CARE_PROVIDER_SITE_OTHER): Admitting: Physician Assistant

## 2023-11-12 VITALS — BP 122/70 | HR 68 | Ht 69.5 in | Wt 182.0 lb

## 2023-11-12 DIAGNOSIS — K269 Duodenal ulcer, unspecified as acute or chronic, without hemorrhage or perforation: Secondary | ICD-10-CM | POA: Diagnosis not present

## 2023-11-12 DIAGNOSIS — Z8719 Personal history of other diseases of the digestive system: Secondary | ICD-10-CM | POA: Diagnosis not present

## 2023-11-12 NOTE — Progress Notes (Signed)
 Chief Complaint: GI Bleeding  HPI:   [name] [name] is a 63 year old Caucasian male with a past medical history as listed below including recent hospitalization for GI bleed, known to Dr. Brice Campi, who present to clinic today for follow-up after recent GI bleed.    08/24/2023 patient consulted by our office in the hospital for a GI bleed, at the time had an acute blood loss anemia with hemoglobin down to 9.9 from his baseline of 16.  Patient had an EGD on 2/19.    08/25/2023 EGD with duodenal ulcers with mild duodenal stenosis, small hiatal hernia and gastritis.  Patient told to use Protonix  40 mg daily.    10/01/2023 CBC with a hemoglobin of 14.1 (9.6 on 08/25/2023), CMP normal, iron elevated to 88, percent saturation 65.5, ferritin 34.6.    Today, patient presents to clinic and tells me he is doing very well.  He continues on Pantoprazole  40 mg daily and has avoided NSAIDs.  No longer taking his iron supplementation.  In general he feels really good.  He is actually glad he started on the Pantoprazole  because he no longer has to chew Tums.    Denies fever, chills, weight loss or change in bowel habits.   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    on meds   History of adenomatous polyp of colon    05-27-2015   History of syncope 10/2020   pcp referred pt to cardiology, seen by dr hochrein, note 12-26-2020 in epic;  work-up includes event monitor 12-03-2020 showed 33% PVCs w/ occ nonsustained VT runs,  echo 12-04-2020 ef 55-55% freq PVCs GiDD,  ETT 12-27-2020 showed negative for ischemia   Hyperlipidemia    on meds   Hypertension    on meds   Idiopathic chronic gout of multiple sites without  tophus    followed by pcp;   LBBB (left bundle branch block) 02/2021   Neuromuscular disorder (HCC)    Pre-diabetes    Pulmonary emphysema (HCC)    per pt pcp note 2022;   pt quit cigarettes 2014  but still smokes cigar , average 2 per month   PVC's (premature ventricular contractions)    08-14-2022 per pt asymptomatic   Raynaud's syndrome    Renal calculus, right 09/2021   Seasonal allergies    Testicular hypogonadism    Wears glasses     Past Surgical History:  Procedure Laterality Date   BICEPS TENDON REPAIR Right 2003   BIOPSY  08/25/2023   Procedure: BIOPSY;  Surgeon: Lajuan Pila, MD;  Location: [facility];  Service: Gastroenterology;;   COLONOSCOPY  05/27/2015   DJ-MAC-suprep(exc)-TA x 2   CYSTOSCOPY WITH RETROGRADE PYELOGRAM, URETEROSCOPY AND STENT PLACEMENT Right 01/01/2021   Procedure: CYSTOSCOPY WITH RETROGRADE PYELOGRAM, URETEROSCOPY AND STENT PLACEMENT;  Surgeon: Osborn Blaze, MD;  [facility];  Service: Urology;  Laterality: Right;  90 MINS   CYSTOSCOPY WITH RETROGRADE PYELOGRAM, URETEROSCOPY AND STENT PLACEMENT Right 01/10/2021   Procedure: CYSTOSCOPY WITH RETROGRADE PYELOGRAM, URETEROSCOPY AND STENT EXCHANGE;  Surgeon: Osborn Blaze, MD;  Location[facility];  Service: Urology;  Laterality: Right;   CYSTOSCOPY WITH RETROGRADE PYELOGRAM,  URETEROSCOPY AND STENT PLACEMENT Right 08/21/2022   Procedure: CYSTOSCOPY WITH RETROGRADE PYELOGRAM, URETEROSCOPY AND STENT PLACEMENT stone basket extracrion;  Surgeon: Osborn Blaze, MD;  Location: [facility];  Service: Urology;  Laterality: Right;   CYSTOSCOPY WITH URETEROSCOPY, STONE BASKETRY AND STENT PLACEMENT Right 09/27/2015   Procedure: 2ND STAGE CYSTOSCOPY RIGHT URETEROSCOPY STENT REPLACEMENT;  Surgeon: Osborn Blaze, MD;  Location: [facility];  Service: Urology;  Laterality: Right;   CYSTOSCOPY/URETEROSCOPY/HOLMIUM LASER/STENT PLACEMENT Right  09/13/2015   Procedure: 1ST STAGE - CYSTOSCOPY/URETEROSCOPY/HOLMIUM LASER/STENT PLACEMENT;  Surgeon: Osborn Blaze, MD;  Location: [facility];  Service: Urology;  Laterality: Right;   ESOPHAGOGASTRODUODENOSCOPY (EGD) WITH PROPOFOL  N/A 08/25/2023   Procedure: ESOPHAGOGASTRODUODENOSCOPY (EGD) WITH PROPOFOL ;  Surgeon: Lajuan Pila, MD;  Location: [facility];  Service: Gastroenterology;  Laterality: N/A;   HOLMIUM LASER APPLICATION Right 09/13/2015   Procedure: HOLMIUM LASER APPLICATION;  Surgeon: Osborn Blaze, MD;  Location[facility];  Service: Urology;  Laterality: Right;   HOLMIUM LASER APPLICATION Right 09/27/2015   Procedure: HOLMIUM LASER;  Surgeon: Osborn Blaze, MD;  Location: [facility];  Service: Urology;  Laterality: Right;   HOLMIUM LASER APPLICATION Right 01/01/2021   Procedure: 1ST STAGE HOLMIUM LASER APPLICATION;  Surgeon: Osborn Blaze, MD;  Location: [facility];  Service: Urology;  Laterality: Right;   HOLMIUM LASER APPLICATION Right 01/10/2021   Procedure: HOLMIUM LASER APPLICATION;  Surgeon: Osborn Blaze, MD;  Location[facility];  Service: Urology;  Laterality: Right;   HOLMIUM LASER APPLICATION Right 08/21/2022   Procedure: HOLMIUM LASER APPLICATION;  Surgeon: Osborn Blaze, MD;  Location: [facility];  Service: Urology;  Laterality: Right;   LITHOTRIPSY  2024   TOTAL SHOULDER ARTHROPLASTY Right 08/17/2013   RIGHT TOTAL SHOULDER ARTHROPLASTY;  Surgeon: Glo Larch, MD;  Location: [facility];  Service: Orthopedics;  Laterality: Right;   WISDOM TOOTH EXTRACTION     no anesthesia    Current Outpatient Medications  Medication Sig Dispense Refill   allopurinol  (ZYLOPRIM ) 300 MG tablet Take 300 mg by mouth daily.     atorvastatin  (LIPITOR) 20 MG tablet TAKE 1 TABLET BY MOUTH EVERY DAY 90 tablet 3   nebivolol  (BYSTOLIC ) 5 MG tablet TAKE 1 TABLET (5 MG TOTAL) BY MOUTH DAILY.  90 tablet 0   pantoprazole  (PROTONIX ) 40 MG tablet Take 1 tablet (40 mg total) by mouth daily. 90 tablet 3   Potassium Citrate 15 MEQ (1620 MG) TBCR Take 2 tablets by mouth 2 (two) times daily.     spironolactone  (ALDACTONE ) 25 MG tablet Take 1 tablet (25 mg total) by mouth daily. 90 tablet 0   amoxicillin  (AMOXIL ) 500 MG tablet Take 500 mg by mouth 3 (three) times daily. (Patient not taking: Reported on 11/12/2023)     ferrous sulfate  325 (65 FE) MG EC tablet Take 1 tablet (325 mg total) by mouth 2 (two) times daily. (Patient not taking: Reported on 11/12/2023) 180 tablet 3   No current facility-administered medications for this visit.    Allergies as of 11/12/2023 - Review Complete 11/12/2023  Allergen Reaction Noted   Lisinopril  Cough 05/30/2012    Family History  Problem Relation Age of Onset   Hyperlipidemia Mother        trig   Coronary artery disease Father        CABG 52, former smoker   Diabetes Father    Hypertension Father    Hypertension Sister  HLD   Colon cancer Neg Hx    Esophageal cancer Neg Hx    Rectal cancer Neg Hx    Stomach cancer Neg Hx    Prostate cancer Neg Hx    Colon polyps Neg Hx     Social History   Socioeconomic History   Marital status: Married    Spouse name: Not on file   Number of children: Not on file   Years of education: Not on file   Highest education level: Not on file  Occupational History   Not on file  Tobacco Use   Smoking status: Some Days    Current packs/day: 0.25    Average packs/day: 0.3 packs/day for 40.0 years (10.0 ttl pk-yrs)    Types: Cigarettes, Cigars   Smokeless tobacco: Never   Tobacco comments:    08-14-2022 quit cigarett's 2014 but currently 2 cigar per month and a pack per 2 weeks of cig  Vaping Use   Vaping status: Never Used  Substance and Sexual Activity   Alcohol use: Yes    Alcohol/week: 0.0 - 14.0 standard drinks of alcohol   Drug use: Never   Sexual activity: Not on file  Other Topics Concern    Not on file  Social History Narrative   Married. Son and daughter (2 and 8 in 2021).  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[employer]       Hobbies: fishing on [employer], golf, gym, family and friends time   Social Drivers of Corporate investment banker Strain: Not on file  Food Insecurity: No Food Insecurity (08/23/2023)   Hunger Vital Sign    Worried About Running Out of Food in the Last Year: Never true    Ran Out of Food in the Last Year: Never true  Transportation Needs: No Transportation Needs (08/23/2023)   PRAPARE - Administrator, Civil Service (Medical): No    Lack of Transportation (Non-Medical): No  Physical Activity: Not on file  Stress: Not on file  Social Connections: Not on file  Intimate Partner Violence: Not At Risk (08/23/2023)   Humiliation, Afraid, Rape, and Kick questionnaire    Fear of Current or Ex-Partner: No    Emotionally Abused: No    Physically Abused: No    Sexually Abused: No    Review of Systems:    Constitutional: No weight loss, fever or chills Cardiovascular: No chest pain  Respiratory: No SOB  Gastrointestinal: See HPI and otherwise negative   Physical Exam:  Vital signs: BP 122/70   Pulse 68   Ht 5' 9.5" (1.765 m)   Wt 182 lb (82.6 kg)   BMI 26.49 kg/m    Constitutional:   Pleasant elderly Caucasian male appears to be in NAD, Well developed, Well nourished, alert and cooperative Respiratory: Respirations even and unlabored. Lungs clear to auscultation bilaterally.   No wheezes, crackles, or rhonchi.  Cardiovascular: Normal S1, S2. No MRG. Regular rate and rhythm. No peripheral edema, cyanosis or pallor.  Gastrointestinal:  Soft, nondistended, nontender. No rebound or guarding. Normal bowel sounds. No appreciable masses or hepatomegaly. Rectal:  Not performed.   Psychiatric: Oriented to person, place and time. Demonstrates good judgement and reason without abnormal affect or behaviors.  RELEVANT LABS AND IMAGING: CBC    Component Value Date/Time   WBC 7.6 10/01/2023  1019   RBC 4.62 10/01/2023 1019   HGB 14.1 10/01/2023 1019   HGB 14.9 10/22/2021 1013   HCT 43.8 10/01/2023 1019   HCT 44.9 10/22/2021 1013   PLT 288.0 10/01/2023 1019   MCV 95.0 10/01/2023 1019   MCH 30.6 08/25/2023 0414   MCHC 32.3 10/01/2023 1019   RDW 14.8 10/01/2023 1019   LYMPHSABS 1.5 10/01/2023 1019   MONOABS 0.6 10/01/2023 1019   EOSABS 0.4 10/01/2023 1019   BASOSABS 0.1 10/01/2023 1019    CMP     Component Value Date/Time   NA 139 10/01/2023 1019   NA 139 12/01/2022 1534   K 4.8 10/01/2023 1019   CL 103 10/01/2023 1019   CO2 26 10/01/2023 1019   GLUCOSE 118 (H) 10/01/2023 1019   BUN 18 10/01/2023 1019   BUN 17 12/01/2022 1534   CREATININE 0.99 10/01/2023 1019   CREATININE 1.17 03/08/2020 1617   CALCIUM  10.1 10/01/2023 1019   PROT 6.9 10/01/2023 1019   ALBUMIN 4.6 10/01/2023 1019   AST 25 10/01/2023 1019   ALT 24 10/01/2023 1019   ALKPHOS 65 10/01/2023 1019   BILITOT 0.6 10/01/2023 1019   GFRNONAA >60 08/25/2023 0414   GFRNONAA 69 03/08/2020 1617   GFRAA 80 03/08/2020 1617    Assessment: 1.  History of GI bleed: EGD with duodenal ulcers at time of hospitalization, patient has remained on a PPI, hemoglobin back to normal and iron studies normal  Plan: 1.  Abstain from NSAIDs 2.  Continue Pantoprazole  40 mg daily, prescribe #90 with 3 refills 3.  Patient to follow in clinic with us  as needed.  Reginal Capra, PA-C [facility] 11/12/2023, 8:29 AM  Cc: Almira Jaeger, MD

## 2023-11-12 NOTE — Patient Instructions (Signed)
 We have sent the following medications to your pharmacy for you to pick up at your convenience: Pantoprazole  40 mg daily 30-60 minutes before breakfast.   _______________________________________________________  If your blood pressure at your visit was 140/90 or greater, please contact your primary care physician to follow up on this.  _______________________________________________________  If you are age 61 or older, your body mass index should be between 23-30. Your Body mass index is 26.49 kg/m. If this is out of the aforementioned range listed, please consider follow up with your Primary Care Provider.  If you are age 24 or younger, your body mass index should be between 19-25. Your Body mass index is 26.49 kg/m. If this is out of the aformentioned range listed, please consider follow up with your Primary Care Provider.   ________________________________________________________  The [facility] providers would like to encourage you to use MYCHART to communicate with providers for non-urgent requests or questions.  Due to long hold times on the telephone, sending your provider a message by [facility] may be a faster and more efficient way to get a response.  Please allow 48 business hours for a response.  Please remember that this is for non-urgent requests.  _______________________________________________________

## 2023-11-13 NOTE — Progress Notes (Signed)
 Attending Physician's Attestation   I have reviewed the chart.   I agree with the Advanced Practitioner's note, impression, and recommendations with any updates as below. Glad to hear he is doing well.  Since his hemoglobin has normalized and he feels well on PPI therapy and will continue PPI therapy, we can forego repeat upper endoscopy at this time.   Yong Henle, MD [facility] Advanced Endoscopy Office # [phone]

## 2023-11-15 ENCOUNTER — Ambulatory Visit: Admitting: Internal Medicine

## 2023-11-17 ENCOUNTER — Other Ambulatory Visit: Payer: Self-pay

## 2023-11-17 ENCOUNTER — Other Ambulatory Visit: Payer: Self-pay | Admitting: Family Medicine

## 2023-11-17 DIAGNOSIS — E785 Hyperlipidemia, unspecified: Secondary | ICD-10-CM

## 2023-11-17 MED ORDER — ATORVASTATIN CALCIUM 20 MG PO TABS: 20.0000 mg | ORAL_TABLET | Freq: Every day | ORAL | 3 refills | Status: DC

## 2023-11-29 NOTE — Progress Notes (Unsigned)
 Cardiology Office Note:  .   Date:  11/29/2023  ID:  [name] [name], DOB Dec 02, 1960, MRN [id number] PCP: Almira Jaeger, MD  [facility] Providers Cardiologist:  Eilleen Grates, MD {  History of Present Illness: .   [name] [name] is a 63 y.o. 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   -- Repeat GXT only monomorphic PVCs were noted ECG LBBB inferior axis but upright avL so not out of summit  ECG from GXT >> LBBB infer axis transition V4 with QS avR/L suggestive of RVOT VT, but also with exercise bidirectional PVCs ;   -- cMRI however is modestly abnormal in a pattern suggestive of myocarditis but there has been interval improvement  -- He has been seen by Dr. Drexel Gentles.   Genetic testing was reviewed from 6/23.  No pathogenic variants were noted.   He saw Dr. Rodolfo Clan last on  08/11/22, discussed perhaps spironolactone  for his mild CM, Though was in the midst of kidney stone w/u and management > deferred, planned fro updated ET  11/12/22: ETT w/Improved VT burden with exercise Continue nebivolol    Hospitalized with GIB Feb 2025, EGD with duodenal ulcers with mild duodenal stenosis, small hiatal hernia and gastritis. Patient told to use Protonix  40 mg daily.   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 ECG rhythm shows normal sinus rhythm.  Stress Findings A manual protocol stress test was performed. Patient exercised for 5 min and 19 sec. Maximum HR of 141 bpm. MPHR 88.0 %. Peak METS 13.4 .  The patient experienced no angina during the test. The patient requested the test to be stopped. The patient reported no symptoms during the stress test. Normal blood pressure and normal heart rate response noted during stress. Heart rate recovery was normal.  Stress ECG Arrhythmias during stress: rare PVCs.     11/10/21 c.MRI IMPRESSION: Mild LV dysfunction and normal RV function. (49%)   Compared to prior study: LVEF has improved, LV size is now normal, decrease in ECV, LGE.  04/29/21: ETT This patient frequent left bundle inferior axis PVCs at rest with very relatively brief intrinsicoid deflection.  As stage II exercise these were fully suppressed, and persistent through stage IV.  Max heart rate was about 100 (done on Corgard ) but immediately following discontinuation of exercise eating with heart rates in the high 90s the PVCs resumed.  Notably compared to the prior study there was no alternative axis PVCs; moreover, there were no PVCs that were associated with a very protracted intrinsicoid deflection as also seen on the prior study   Continue current therapy    03/13/21: c.MRI IMPRESSION: Mild LV dysfunction with normal RV function.  [facility] Criteria for myocarditis not met. CMR AR[facility] Criteria not met.   12/04/20: TTE 1. Frequent PVCs throughout the study. Left ventricular ejection  fraction, by estimation, is 50 to 55%. The left ventricle has low normal  function. The left ventricle has no regional wall motion abnormalities.  Left ventricular diastolic parameters are  consistent with Grade I diastolic dysfunction (impaired relaxation).   2. Right ventricular systolic function is normal. The right ventricular  size is  normal.   3. The mitral valve is normal in structure. Mild mitral valve  regurgitation.   4. The aortic valve is tricuspid. Aortic valve regurgitation is not  visualized. No aortic stenosis is present.   5. The inferior vena cava is normal in size with greater than 50%  respiratory variability, suggesting right atrial pressure of 3 mmHg.   Comparison(s): No prior Echocardiogram.    Risk Assessment/Calculations:    Physical Exam:   VS:  There were no vitals taken for this visit.   Wt Readings from Last 3 Encounters:  11/12/23 182 lb (82.6 kg)  10/01/23 183 lb 9.6 oz (83.3 kg)  08/24/23 183 lb 6.8 oz (83.2 kg)    GEN: Well nourished, well developed in no acute distress NECK: No JVD; No carotid bruits CARDIAC: RRR, no murmurs, rubs, gallops RESPIRATORY:  CTA b/l without rales, wheezing or rhonchi  ABDOMEN: Soft, non-tender, non-distended EXTREMITIES:  No edema; No deformity   ASSESSMENT AND PLAN: .    PVCs, NSVT Dr.Klein describes: dimorphic morphology right bundle inferior axis with upright QRS lead aVL versus negative QRS in lead aVL  Exercise-induced biventricular, alternating axis PVCs   No symptoms Continue nebivolol   HTN Looks great  3. Mild CM Will update his echo given has been a few years No symptoms or exam findings to suggest volume OL/clinical changes     Dispo:   Signed, Debbie Fails, PA-C

## 2023-11-30 ENCOUNTER — Ambulatory Visit: Attending: Physician Assistant | Admitting: Physician Assistant

## 2023-11-30 VITALS — BP 125/77 | HR 62 | Ht 69.5 in | Wt 183.0 lb

## 2023-11-30 DIAGNOSIS — I10 Essential (primary) hypertension: Secondary | ICD-10-CM

## 2023-11-30 DIAGNOSIS — I428 Other cardiomyopathies: Secondary | ICD-10-CM | POA: Diagnosis not present

## 2023-11-30 DIAGNOSIS — I4729 Other ventricular tachycardia: Secondary | ICD-10-CM

## 2023-11-30 DIAGNOSIS — I493 Ventricular premature depolarization: Secondary | ICD-10-CM

## 2023-11-30 NOTE — Patient Instructions (Signed)
 Medication Instructions:   Your physician recommends that you continue on your current medications as directed. Please refer to the Current Medication list given to you today.  *If you need a refill on your cardiac medications before your next appointment, please call your pharmacy*   Lab Work: NONE ORDERED  TODAY   If you have labs (blood work) drawn today and your tests are completely normal, you will receive your results only by: MyChart Message (if you have MyChart) OR A paper copy in the mail If you have any lab test that is abnormal or we need to change your treatment, we will call you to review the results.   Testing/Procedures: Your physician has requested that you have an echocardiogram. Echocardiography is a painless test that uses sound waves to create images of your heart. It provides your doctor with information about the size and shape of your heart and how well your heart's chambers and valves are working. This procedure takes approximately one hour. There are no restrictions for this procedure. Please do NOT wear cologne, perfume, aftershave, or lotions (deodorant is allowed). Please arrive 15 minutes prior to your appointment time.  Please note: We ask at that you not bring children with you during ultrasound (echo/ vascular) testing. Due to room size and safety concerns, children are not allowed in the ultrasound rooms during exams. Our front office staff cannot provide observation of children in our lobby area while testing is being conducted. An adult accompanying a patient to their appointment will only be allowed in the ultrasound room at the discretion of the ultrasound technician under special circumstances. We apologize for any inconvenience.   Follow-Up: At [facility], you and your health needs are our priority.  As part of our continuing mission to provide you with exceptional heart care, our providers are all part of one team.  This team includes your  primary Cardiologist (physician) and Advanced Practice Providers or APPs (Physician Assistants and Nurse Practitioners) who all work together to provide you with the care you need, when you need it.  Your next appointment:   1 year(s)   Provider:   Mertha Abrahams, PA-C    We recommend signing up for the patient portal called "MyChart".  Sign up information is provided on this After Visit Summary.  MyChart is used to connect with patients for Virtual Visits (Telemedicine).  Patients are able to view lab/test results, encounter notes, upcoming appointments, etc.  Non-urgent messages can be sent to your provider as well.   To learn more about what you can do with MyChart, go to [email or web address].   Other Instructions

## 2023-12-02 ENCOUNTER — Other Ambulatory Visit: Payer: Self-pay | Admitting: Internal Medicine

## 2024-01-04 ENCOUNTER — Ambulatory Visit (HOSPITAL_COMMUNITY)
Admission: RE | Admit: 2024-01-04 | Discharge: 2024-01-04 | Disposition: A | Discharge status: Home / Self Care | Source: Ambulatory Visit | Attending: Cardiology | Admitting: Cardiology

## 2024-01-04 DIAGNOSIS — I493 Ventricular premature depolarization: Secondary | ICD-10-CM | POA: Insufficient documentation

## 2024-01-04 DIAGNOSIS — I428 Other cardiomyopathies: Secondary | ICD-10-CM | POA: Diagnosis not present

## 2024-01-06 ENCOUNTER — Ambulatory Visit: Payer: Self-pay | Admitting: Physician Assistant

## 2024-01-06 LAB — ECHOCARDIOGRAM COMPLETE
Area-P 1/2: 2.81 cm2
S' Lateral: 3.36 cm

## 2024-02-07 ENCOUNTER — Other Ambulatory Visit: Payer: Self-pay

## 2024-02-07 ENCOUNTER — Ambulatory Visit: Payer: Self-pay | Admitting: Family Medicine

## 2024-02-07 ENCOUNTER — Ambulatory Visit (INDEPENDENT_AMBULATORY_CARE_PROVIDER_SITE_OTHER): Payer: No Typology Code available for payment source | Admitting: Family Medicine

## 2024-02-07 ENCOUNTER — Encounter: Payer: Self-pay | Admitting: Family Medicine

## 2024-02-07 VITALS — BP 124/82 | HR 55 | Temp 97.6°F | Ht 69.5 in | Wt 180.6 lb

## 2024-02-07 DIAGNOSIS — Z87891 Personal history of nicotine dependence: Secondary | ICD-10-CM

## 2024-02-07 DIAGNOSIS — I10 Essential (primary) hypertension: Secondary | ICD-10-CM

## 2024-02-07 DIAGNOSIS — Z720 Tobacco use: Secondary | ICD-10-CM

## 2024-02-07 DIAGNOSIS — E785 Hyperlipidemia, unspecified: Secondary | ICD-10-CM

## 2024-02-07 DIAGNOSIS — D509 Iron deficiency anemia, unspecified: Secondary | ICD-10-CM

## 2024-02-07 DIAGNOSIS — Z122 Encounter for screening for malignant neoplasm of respiratory organs: Secondary | ICD-10-CM

## 2024-02-07 DIAGNOSIS — E119 Type 2 diabetes mellitus without complications: Secondary | ICD-10-CM

## 2024-02-07 LAB — CBC WITH DIFFERENTIAL/PLATELET
Basophils Absolute: 0.1 K/uL (ref 0.0–0.1)
Basophils Relative: 1 % (ref 0.0–3.0)
Eosinophils Absolute: 0.4 K/uL (ref 0.0–0.7)
Eosinophils Relative: 4.6 % (ref 0.0–5.0)
HCT: 45.8 % (ref 39.0–52.0)
Hemoglobin: 14.9 g/dL (ref 13.0–17.0)
Lymphocytes Relative: 18.2 % (ref 12.0–46.0)
Lymphs Abs: 1.5 K/uL (ref 0.7–4.0)
MCHC: 32.5 g/dL (ref 30.0–36.0)
MCV: 90.2 fl (ref 78.0–100.0)
Monocytes Absolute: 0.6 K/uL (ref 0.1–1.0)
Monocytes Relative: 6.6 % (ref 3.0–12.0)
Neutro Abs: 5.8 K/uL (ref 1.4–7.7)
Neutrophils Relative %: 69.6 % (ref 43.0–77.0)
Platelets: 240 K/uL (ref 150.0–400.0)
RBC: 5.07 Mil/uL (ref 4.22–5.81)
RDW: 15.9 % — ABNORMAL HIGH (ref 11.5–15.5)
WBC: 8.4 K/uL (ref 4.0–10.5)

## 2024-02-07 LAB — IBC + FERRITIN
Ferritin: 62.2 ng/mL (ref 22.0–322.0)
Iron: 111 ug/dL (ref 42–165)
Saturation Ratios: 29.4 % (ref 20.0–50.0)
TIBC: 378 ug/dL (ref 250.0–450.0)
Transferrin: 270 mg/dL (ref 212.0–360.0)

## 2024-02-07 LAB — COMPREHENSIVE METABOLIC PANEL WITH GFR
ALT: 29 U/L (ref 0–53)
AST: 28 U/L (ref 0–37)
Albumin: 4.3 g/dL (ref 3.5–5.2)
Alkaline Phosphatase: 74 U/L (ref 39–117)
BUN: 14 mg/dL (ref 6–23)
CO2: 25 meq/L (ref 19–32)
Calcium: 9.5 mg/dL (ref 8.4–10.5)
Chloride: 103 meq/L (ref 96–112)
Creatinine, Ser: 0.98 mg/dL (ref 0.40–1.50)
GFR: 83.22 mL/min (ref >60.00)
Glucose, Bld: 123 mg/dL — ABNORMAL HIGH (ref 70–99)
Potassium: 4.4 meq/L (ref 3.5–5.1)
Sodium: 138 meq/L (ref 135–145)
Total Bilirubin: 0.7 mg/dL (ref 0.2–1.2)
Total Protein: 6.9 g/dL (ref 6.0–8.3)

## 2024-02-07 LAB — HEMOGLOBIN A1C: Hgb A1c MFr Bld: 7.2 % — ABNORMAL HIGH (ref 4.6–6.5)

## 2024-02-07 MED ORDER — ATORVASTATIN CALCIUM 20 MG PO TABS: 20.0000 mg | ORAL_TABLET | ORAL | 3 refills | Status: AC

## 2024-02-07 NOTE — Patient Instructions (Addendum)
 Schedule eye exam and make sure they know you have new diabetes diagnosis and to send us  a copy  Let us  know if you get flu shot or can call back to schedule in maybe a month  Keep working on healthy eating and regular exercise since we are reducing cholesterol medicine to every other day to try to counter the lowering of the medicine.   We have placed a referral for you today to lung cancer screening - please call their # if you do not hear within a week (may be listed below or you may see mychart message within a few days with #).    Please stop by lab before you go If you have mychart- we will send your results within 3 business days of us  receiving them.  If you do not have mychart- we will call you about results within 5 business days of us  receiving them.  *please also note that you will see labs on mychart as soon as they post. I will later go in and write notes on them- will say notes from Dr. Katrinka   Recommended follow up: Return in about 6 months (around 08/09/2024) for physical or sooner if needed.Schedule b4 you leave.

## 2024-02-07 NOTE — Progress Notes (Signed)
 Phone [phone] In person visit   Subjective:   [name] [name] is a 63 y.o. year old very pleasant male patient who presents for/with See problem oriented charting Chief Complaint  Patient presents with   Medical Management of Chronic Issues   Diabetes    DEE not scheduled   Past Medical History-  Patient Active Problem List   Diagnosis Date Noted   H/O: upper GI bleed 10/01/2023    Priority: High   Diabetes mellitus without complication (HCC) 04/01/2022    Priority: High   Cardiomyopathy (HCC) 12/08/2021    Priority: High   Emphysema of lung (HCC) 03/08/2020    Priority: High   Tobacco abuse 08/13/2014    Priority: High   PVC's (premature ventricular contractions) 12/08/2021    Priority: Medium    Syncope 04/06/2021    Priority: Medium    Ventricular tachycardia (HCC) 04/06/2021    Priority: Medium    Hyperlipidemia 05/26/2010    Priority: Medium    Gout 05/26/2010    Priority: Medium    Essential hypertension 05/26/2010    Priority: Medium    History of adenomatous polyp of colon 06/03/2015    Priority: Low   Allergic rhinitis 08/13/2014    Priority: Low   S/P shoulder replacement 08/17/2013    Priority: Low   Acne rosacea 05/05/2013    Priority: Low   History of revision of root canal treatment of molar tooth 08/23/2023    Medications- reviewed and updated Current Outpatient Medications  Medication Sig Dispense Refill   allopurinol  (ZYLOPRIM ) 300 MG tablet Take 300 mg by mouth daily.     atorvastatin  (LIPITOR) 20 MG tablet Take 1 tablet (20 mg total) by mouth daily. 90 tablet 3   nebivolol  (BYSTOLIC ) 5 MG tablet TAKE 1 TABLET (5 MG TOTAL) BY MOUTH DAILY. 90 tablet 3   pantoprazole  (PROTONIX ) 40 MG tablet Take 1 tablet (40 mg total) by mouth daily. 90 tablet 3   Potassium Citrate 15 MEQ (1620 MG) TBCR Take 2 tablets by mouth 2 (two) times daily.     spironolactone  (ALDACTONE ) 25 MG tablet TAKE 1 TABLET (25 MG TOTAL) BY MOUTH DAILY. 90 tablet 3   No  current facility-administered medications for this visit.     Objective:  BP 124/82   Pulse (!) 55   Temp 97.6 F (36.4 C)   Ht 5' 9.5 (1.765 m)   Wt 180 lb 9.6 oz (81.9 kg)   SpO2 97%   BMI 26.29 kg/m  Gen: NAD, resting comfortably CV: RRR no murmurs rubs or gallops Lungs: CTAB no crackles, wheeze, rhonchi Ext: no edema Skin: warm, dry  Diabetic foot exam was performed with the following findings:   No deformities, ulcerations, or other skin breakdown Normal sensation of 10g monofilament Intact posterior tibialis and dorsalis pedis pulses Hammer toe on right 2nd toe         Assessment and Plan   # History of upper GI bleed-patient was hospitalized mid February 2025 after presenting with dark stools/nausea/upper abdominal fullness and distention (had needed ibuprofen after recent root canal) with hemoglobin dropping about 3 points and positive fecal occult blood-EGD with findings of nonobstructing mild duodenal stenosis and mild Schatzki ring at GE junction, small hiatal hernia, gastritis, mild duodenitis, 2 nonbleeding cratered duodenal ulcers without stigmata of bleeding.  Gastric biopsy performed- benign. - GI recommended Protonix  40 mg daily as well as iron daily with repeat CBC  which had improved - Outpatient follow-up may 2025 with as  needed follow up only noted - he stopped the iron a few months ago- wants to recheck today along with CBC  # Diabetes-new diagnosis August 02, 2023 S: Medication: diet controlled CBGs- declines meter for now Exercise and diet- very active with work still and also walking after dinner with wife. Lifted years ago but worries about injury. Also has worked on diet. Reduced alcohol 50%. Weight down a few lbs as well Lab Results  Component Value Date   HGBA1C 7.0 (H) 08/02/2023   HGBA1C 6.7 (H) 04/01/2022  A/P: hopefully stable- update a1c today. Continue without meds for now  -normal foot exam today -needs diabetes eye exam  #history  of Syncope- has seen cardiology due to PVCs and v tach- followed by Dr. Fernande and on beta blockers (less fatigue on nebivolol  5 mg daily and has continued) -Raynauds noted- some consideration of changing to verapamil if needed -cardiomyopathy- followed by Dr. Lavona- most recent cardiac MRI improved on 11/10/21 Mild LV dysfunction and normal RV function.   Compared to prior study: LVEF has improved, LV size is now normal, decrease in ECV, LGE.  -last Echo 01/04/24 with EF 50-55%  # Emphysema -incidental finding on chest CT August 2020-largely asymptomatic but long-term smoker-trying to cut down Lung disease was noted on chest x-ray mid February 2025 - has been cutting back- pack lasting up to a week .  -refer back to lung cancer screening- he has not heard  #hypertension S: medication: Nebivolol  5 mg,  spironolactone  25 mg- some nipple tenderness but improved  BP Readings from Last 3 Encounters:  02/07/24 124/82  11/30/23 125/77  11/12/23 122/70  A/P: well controlled continue current medications    #hyperlipidemia S: Medication:Atorvastatin  20 mg daily -Incidental finding on chest ct- 2. Coronary artery atherosclerosis. Lab Results  Component Value Date   CHOL 198 08/02/2023   HDL 53.90 08/02/2023   LDLCALC 73 03/31/2021   LDLDIRECT 84.0 08/02/2023   TRIG (H) 08/02/2023    403.0 Triglyceride is over 400; calculations on Lipids are invalid.   CHOLHDL 4 08/02/2023   A/P: lipids mildly elevated even on statin- he wants to work on improving diet and exercise and try reduction to every other day and recheck at physical   #Gout- no recent flares S: Medication: allopurinol  300mg  As needed:  avoid NSAIDs with gastrointestinal bleed history        Lab Results  Component Value Date   LABURIC 4.2 08/02/2023  A/P:very good control of uric acid- check annually   #nephrolithiasis- follows with Dr. Alvaro and on potassium citrate as of 2023- taking 2 now    #fatty liver on ct chest-  incidental finding- check LFTs with labs yearly   and also focus on mild weight loss and alcohol reduction- but has worked on diet and exercise  Recommended follow up: Return in about 6 months (around 08/09/2024) for physical or sooner if needed.Schedule b4 you leave.  Lab/Order associations: fasting   ICD-10-CM   1. Diabetes mellitus without complication (HCC)  E11.9 Hemoglobin A1c    Comprehensive metabolic panel with GFR    2. Essential hypertension  I10     3. Hyperlipidemia, unspecified hyperlipidemia type  E78.5     4. Tobacco abuse  Z72.0 Ambulatory Referral for Lung Cancer Scre    5. Screening for lung cancer  Z12.2 Ambulatory Referral for Lung Cancer Scre    6. Iron deficiency anemia, unspecified iron deficiency anemia type  D50.9 CBC with Differential/Platelet    IBC +  Ferritin      No orders of the defined types were placed in this encounter.   Return precautions advised.  Garnette Lukes, MD

## 2024-02-08 ENCOUNTER — Other Ambulatory Visit: Payer: Self-pay

## 2024-02-08 MED ORDER — METFORMIN HCL 500 MG PO TABS: 500.0000 mg | ORAL_TABLET | Freq: Two times a day (BID) | ORAL | 3 refills | Status: DC

## 2024-02-14 ENCOUNTER — Other Ambulatory Visit: Payer: Self-pay

## 2024-02-14 DIAGNOSIS — E119 Type 2 diabetes mellitus without complications: Secondary | ICD-10-CM

## 2024-02-14 MED ORDER — METFORMIN HCL 500 MG PO TABS: 500.0000 mg | ORAL_TABLET | Freq: Two times a day (BID) | ORAL | 3 refills | Status: AC

## 2024-02-14 NOTE — Telephone Encounter (Signed)
 Called and spoke with Abby at [facility]. She stated the day we sent the original prescription the pharmacy did not have power so they did not receive it. I have resent the medicine and called the pt and made him aware. Pt verbalized understanding and was grateful for the update. Medicine should be ready for pickup this afternoon.

## 2024-02-16 LAB — LIPOPROTEIN A (LPA): Lipoprotein (a): 155 nmol/L — ABNORMAL HIGH (ref <75)

## 2024-02-25 ENCOUNTER — Ambulatory Visit
Admission: RE | Admit: 2024-02-25 | Discharge: 2024-02-25 | Disposition: A | Discharge status: Home / Self Care | Source: Ambulatory Visit | Attending: Acute Care | Admitting: Acute Care

## 2024-02-25 DIAGNOSIS — Z122 Encounter for screening for malignant neoplasm of respiratory organs: Secondary | ICD-10-CM

## 2024-02-25 DIAGNOSIS — Z87891 Personal history of nicotine dependence: Secondary | ICD-10-CM

## 2024-03-13 ENCOUNTER — Other Ambulatory Visit: Payer: Self-pay | Admitting: Acute Care

## 2024-03-13 DIAGNOSIS — Z122 Encounter for screening for malignant neoplasm of respiratory organs: Secondary | ICD-10-CM

## 2024-03-13 DIAGNOSIS — Z87891 Personal history of nicotine dependence: Secondary | ICD-10-CM
